# Patient Record
Sex: Female | Born: 1940 | Race: White | Hispanic: No | Marital: Married | Smoking: Former smoker
Health system: Southern US, Community
[De-identification: ages and names within clinical notes are randomized; demographics above are authoritative.]

## PROBLEM LIST (undated history)

## (undated) DIAGNOSIS — R112 Nausea with vomiting, unspecified: Secondary | ICD-10-CM

## (undated) DIAGNOSIS — Z5189 Encounter for other specified aftercare: Secondary | ICD-10-CM

## (undated) DIAGNOSIS — Z9889 Other specified postprocedural states: Secondary | ICD-10-CM

## (undated) DIAGNOSIS — R51 Headache: Secondary | ICD-10-CM

## (undated) DIAGNOSIS — I10 Essential (primary) hypertension: Secondary | ICD-10-CM

## (undated) DIAGNOSIS — D649 Anemia, unspecified: Secondary | ICD-10-CM

## (undated) DIAGNOSIS — M797 Fibromyalgia: Secondary | ICD-10-CM

## (undated) DIAGNOSIS — M1990 Unspecified osteoarthritis, unspecified site: Secondary | ICD-10-CM

## (undated) DIAGNOSIS — F419 Anxiety disorder, unspecified: Secondary | ICD-10-CM

## (undated) DIAGNOSIS — F32A Depression, unspecified: Secondary | ICD-10-CM

## (undated) DIAGNOSIS — IMO0001 Reserved for inherently not codable concepts without codable children: Secondary | ICD-10-CM

## (undated) DIAGNOSIS — F329 Major depressive disorder, single episode, unspecified: Secondary | ICD-10-CM

## (undated) HISTORY — DX: Anemia, unspecified: D64.9

## (undated) HISTORY — PX: INCONTINENCE SURGERY: SHX676

## (undated) HISTORY — DX: Other specified postprocedural states: Z98.890

## (undated) HISTORY — DX: Reserved for inherently not codable concepts without codable children: IMO0001

## (undated) HISTORY — PX: ABDOMINAL HYSTERECTOMY: SHX81

## (undated) HISTORY — DX: Fibromyalgia: M79.7

## (undated) HISTORY — DX: Unspecified osteoarthritis, unspecified site: M19.90

## (undated) HISTORY — DX: Anxiety disorder, unspecified: F41.9

## (undated) HISTORY — DX: Headache: R51

## (undated) HISTORY — PX: TONSILLECTOMY: SUR1361

## (undated) HISTORY — DX: Essential (primary) hypertension: I10

## (undated) HISTORY — DX: Depression, unspecified: F32.A

## (undated) HISTORY — DX: Encounter for other specified aftercare: Z51.89

## (undated) HISTORY — DX: Nausea with vomiting, unspecified: R11.2

## (undated) HISTORY — DX: Major depressive disorder, single episode, unspecified: F32.9

## (undated) HISTORY — PX: RECTOCELE REPAIR: SHX761

## (undated) HISTORY — PX: CYSTOCELE REPAIR: SHX163

---

## 1998-04-28 ENCOUNTER — Other Ambulatory Visit: Admission: RE | Admit: 1998-04-28 | Discharge: 1998-04-28 | Payer: Self-pay | Admitting: Obstetrics & Gynecology

## 1999-04-30 ENCOUNTER — Other Ambulatory Visit: Admission: RE | Admit: 1999-04-30 | Discharge: 1999-04-30 | Payer: Self-pay | Admitting: Obstetrics & Gynecology

## 1999-10-23 ENCOUNTER — Inpatient Hospital Stay (HOSPITAL_COMMUNITY): Admission: RE | Admit: 1999-10-23 | Discharge: 1999-10-26 | Payer: Self-pay | Admitting: Obstetrics & Gynecology

## 1999-10-27 ENCOUNTER — Inpatient Hospital Stay (HOSPITAL_COMMUNITY): Admission: AD | Admit: 1999-10-27 | Discharge: 1999-10-27 | Payer: Self-pay | Admitting: Obstetrics and Gynecology

## 1999-11-17 ENCOUNTER — Encounter: Payer: Self-pay | Admitting: Obstetrics & Gynecology

## 1999-11-17 ENCOUNTER — Encounter: Admission: RE | Admit: 1999-11-17 | Discharge: 1999-11-17 | Payer: Self-pay | Admitting: Obstetrics & Gynecology

## 2000-03-21 ENCOUNTER — Encounter (INDEPENDENT_AMBULATORY_CARE_PROVIDER_SITE_OTHER): Payer: Self-pay

## 2000-03-21 ENCOUNTER — Other Ambulatory Visit: Admission: RE | Admit: 2000-03-21 | Discharge: 2000-03-21 | Payer: Self-pay | Admitting: Gastroenterology

## 2000-04-06 ENCOUNTER — Ambulatory Visit (HOSPITAL_COMMUNITY): Admission: RE | Admit: 2000-04-06 | Discharge: 2000-04-06 | Payer: Self-pay | Admitting: Gastroenterology

## 2000-04-06 ENCOUNTER — Encounter: Payer: Self-pay | Admitting: Gastroenterology

## 2000-06-10 ENCOUNTER — Other Ambulatory Visit: Admission: RE | Admit: 2000-06-10 | Discharge: 2000-06-10 | Payer: Self-pay | Admitting: Gastroenterology

## 2000-11-28 ENCOUNTER — Encounter: Payer: Self-pay | Admitting: Obstetrics & Gynecology

## 2000-11-28 ENCOUNTER — Encounter: Admission: RE | Admit: 2000-11-28 | Discharge: 2000-11-28 | Payer: Self-pay | Admitting: Obstetrics & Gynecology

## 2000-11-30 ENCOUNTER — Encounter: Admission: RE | Admit: 2000-11-30 | Discharge: 2000-11-30 | Payer: Self-pay | Admitting: Obstetrics & Gynecology

## 2000-11-30 ENCOUNTER — Encounter: Payer: Self-pay | Admitting: Obstetrics & Gynecology

## 2001-02-16 ENCOUNTER — Encounter: Admission: RE | Admit: 2001-02-16 | Discharge: 2001-02-16 | Payer: Self-pay | Admitting: Geriatric Medicine

## 2001-02-16 ENCOUNTER — Encounter: Payer: Self-pay | Admitting: Geriatric Medicine

## 2001-06-01 ENCOUNTER — Other Ambulatory Visit: Admission: RE | Admit: 2001-06-01 | Discharge: 2001-06-01 | Payer: Self-pay | Admitting: Obstetrics & Gynecology

## 2001-12-01 ENCOUNTER — Encounter: Admission: RE | Admit: 2001-12-01 | Discharge: 2001-12-01 | Payer: Self-pay | Admitting: Obstetrics & Gynecology

## 2001-12-01 ENCOUNTER — Encounter: Payer: Self-pay | Admitting: Obstetrics & Gynecology

## 2002-12-03 ENCOUNTER — Encounter: Payer: Self-pay | Admitting: Obstetrics and Gynecology

## 2002-12-03 ENCOUNTER — Encounter: Admission: RE | Admit: 2002-12-03 | Discharge: 2002-12-03 | Payer: Self-pay | Admitting: Obstetrics and Gynecology

## 2003-08-20 ENCOUNTER — Other Ambulatory Visit: Admission: RE | Admit: 2003-08-20 | Discharge: 2003-08-20 | Payer: Self-pay | Admitting: Obstetrics and Gynecology

## 2003-12-09 ENCOUNTER — Encounter: Admission: RE | Admit: 2003-12-09 | Discharge: 2003-12-09 | Payer: Self-pay | Admitting: Obstetrics and Gynecology

## 2004-08-24 ENCOUNTER — Other Ambulatory Visit
Admission: RE | Admit: 2004-08-24 | Discharge: 2004-08-24 | Payer: Self-pay | Admitting: Physical Medicine & Rehabilitation

## 2004-12-24 ENCOUNTER — Encounter: Admission: RE | Admit: 2004-12-24 | Discharge: 2004-12-24 | Payer: Self-pay | Admitting: Obstetrics and Gynecology

## 2005-08-24 ENCOUNTER — Other Ambulatory Visit: Admission: RE | Admit: 2005-08-24 | Discharge: 2005-08-24 | Payer: Self-pay | Admitting: Obstetrics and Gynecology

## 2006-01-03 ENCOUNTER — Encounter: Admission: RE | Admit: 2006-01-03 | Discharge: 2006-01-03 | Payer: Self-pay | Admitting: Geriatric Medicine

## 2006-08-31 ENCOUNTER — Other Ambulatory Visit: Admission: RE | Admit: 2006-08-31 | Discharge: 2006-08-31 | Payer: Self-pay | Admitting: Obstetrics and Gynecology

## 2007-01-09 ENCOUNTER — Encounter: Admission: RE | Admit: 2007-01-09 | Discharge: 2007-01-09 | Payer: Self-pay | Admitting: Obstetrics and Gynecology

## 2007-03-16 ENCOUNTER — Ambulatory Visit (HOSPITAL_COMMUNITY): Admission: RE | Admit: 2007-03-16 | Discharge: 2007-03-18 | Payer: Self-pay | Admitting: Urology

## 2007-10-10 ENCOUNTER — Encounter: Admission: RE | Admit: 2007-10-10 | Discharge: 2007-10-10 | Payer: Self-pay | Admitting: Geriatric Medicine

## 2008-01-10 ENCOUNTER — Encounter: Admission: RE | Admit: 2008-01-10 | Discharge: 2008-01-10 | Payer: Self-pay | Admitting: Geriatric Medicine

## 2008-09-04 ENCOUNTER — Other Ambulatory Visit: Admission: RE | Admit: 2008-09-04 | Discharge: 2008-09-04 | Payer: Self-pay | Admitting: Obstetrics and Gynecology

## 2009-03-04 ENCOUNTER — Encounter: Admission: RE | Admit: 2009-03-04 | Discharge: 2009-03-04 | Payer: Self-pay | Admitting: Obstetrics and Gynecology

## 2010-03-09 ENCOUNTER — Encounter: Admission: RE | Admit: 2010-03-09 | Discharge: 2010-03-09 | Payer: Self-pay | Admitting: Geriatric Medicine

## 2010-10-06 NOTE — Op Note (Signed)
NAMEMAHAYLA, Eileen Jennings                  ACCOUNT NO.:  [id number]   MEDICAL RECORD NO.:  [id number]          PATIENT TYPE:  AMB   LOCATION:  DAY                          FACILITY:  [facility]   PHYSICIAN:  Sigmund I. 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   OPERATIONS:  1. Transobturator pubovaginal sling.  2. Anterior repair of cystocele.  3. Pinnacle colpopexy with sacrospinous fixation.  4. Insertion of mesh.   SURGEON:  Sigmund I. Patsi Sears, Eileen.D.   ANESTHESIA:  General LMA.   PREPARATION:  After appropriate preanesthesia, the patient was brought  to the operating room and placed on the operating room table in the  dorsal supine position where general LMA anesthesia was introduced.  She  was then replaced in the dorsal lithotomy position where the pubis was  prepped with Betadine solution and draped in the usual fashion.   REVIEW OF HISTORY:  This 70 year old female is status post anterior and  posterior repair in 2000 by GYN.  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  On physical examination, the  patient has Jennings positive Marshall test and Jennings grade 4 cystourethrocele.  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  The patient is covered with prednisone, and is now for  repair.   PROCEDURE:  With the patient in the dorsal lithotomy position,  examination shows Jennings grade 4 cystourethrocele.  There is scarring of the  rectal fossa, consistent with previous rectocele repair, and there is no  rectocele noted today.  The scarring from the cervix was identified from  previous  hysterectomy.  There was also urethral scar from previous  urethrocele repair.  The vaginal tissue is very thinned.  The cystocele  was injected with 0.25 Marcaine with epinephrine, 1:200,000, 20 mL.  The  midline incision was made.  Subcutaneous dissection was accomplished  with sharp and blunt dissection, with care taken to avoid any bladder  injury.  Bleeding was electrocoagulated.  The dissection was carried to  the ischial spines bilaterally.  Care was taken to avoid any incision in  the mid distal urethra.  Following dissection, anterior vaginal vault  repair was accomplished using Kelly plication with horizontal mattress  running 3-0 Vicryl suture.  Following this, colpopexy with sacrospinous  fixation was accomplished using the [employer] device.  This was accomplished by identifying the ischial spines and then placing  the Capio device through the sacrospinous ligament on the left and the  arcus tendineus on left; and then the sacrospinous ligament on the right  and the arcus tendineus on the right.  The fixation was accomplished  using permanent mesh material.  This was then snugged into place, and  the excess was removed.  The mesh was then tacked to the  anterior  vaginal vault.  Excellent reduction of the cystocele was noted.  No  bleeding was noted.  Cystoscopy was accomplished after indigo carmine  was given, and jets of blue urine were seen from both ureteral orifices.   The Foley was replaced, and the edge of the vaginal tissue was excised  and the anterior vaginal wall closed with running 3-0 Vicryl suture.   Attention was then directed to the midurethra, where Jennings of 1.5-cm  incision was made and the subcutaneous tissue dissected with sharp and  blunt dissection.  0.5 cm lateral to the clitoris was marked with Jennings blue  pen, and incision was made with the tip of Jennings knife blade.  Using the  [facility] transobturator sling, Jennings pubovaginal sling was  placed.  Cystoscopy was again accomplished, and blue dye was again seen coming  from the ureteral orifices.  No mesh was identified within the bladder.  The bladder was again drained with the Foley catheter, and using Jennings right-  angle for tensioning behind the sling, the plastic sleeves were cut and  the sleeves removed and amputated at the level of the skin.  The skin  wounds were closed with Dermabond.  The urethral incision was closed  with 3-0 Vicryl suture.  Packing was applied with Estrace cream, and the  Foley catheter was left in place.   The patient was awakened and taken to the recovery room in good  condition.  She had Jennings B&O suppository placed preoperatively.      Sigmund I. Patsi Sears, Eileen.D.  Electronically Signed     SIT/MEDQ  D:  03/16/2007  T:  03/17/2007  Job:  [id number]   cc:   Hal T. Stoneking, Eileen.D.  Fax: [phone]

## 2010-10-06 NOTE — H&P (Signed)
[name], [name]                  ACCOUNT NO.:  [id number]   MEDICAL RECORD NO.:  [id number]          PATIENT TYPE:  OIB   LOCATION:  1437                         FACILITY:  [facility]   PHYSICIAN:  Sigmund I. Patsi Sears, M.D.DATE OF BIRTH:  03-10-1941   DATE OF ADMISSION:  03/16/2007  DATE OF DISCHARGE:                              HISTORY & PHYSICAL   HISTORY:  [name] is a 70 year old married female from [place],  [place], seen February 14, 2007, with recurrent large grade 4  cystocele status post anterior and posterior repair per gynecology in  2000.  She also had stress urinary incontinence.  The patient has a past  medical history significant for:  1. Ankylosing spondylitis.  2. Positive rheumatoid factor.  3. 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  This has been treated with      prednisone 3 mg a day.   Urodynamics were accomplished and shows that the patient has a small  capacity bladder with early sensory urgency and very large pelvic  prolapse.  She is admitted to hospital for pelvic floor repair.  She has  been using Oxytrol patches the past.  Additional medications include:  1. Macrobid one p.o. per day.  2. Oxytrol.  3. Prednisone as noted above.  4. Premarin.  5. Toprol-XL.  6. P.r.n. Tylenol.   PAST MEDICAL HISTORY:  Significant for:  1. Ankylosing Spondylitis  2. Arthritis (see above).  3. Glaucoma (borderline).  4. Depression.  5. Anxiety.   FAMILY HISTORY:  1. Depression.  2. Arthritis.  3. Stroke (father died secondary to self-inflicted wound).   SOCIAL:  The patient is a retired Interior and spatial designer.  She is married with two  daughters.  Alcohol:  None.  Tobacco:  A 34 pack-year history but none  currently.  Caffeinated drinks:  One per day.   CONSTITUTIONAL REVIEW OF SYSTEMS:  1. Easy bruisability.  2. Chronic cough.  3. Joint discomfort.  4. Headaches.   ADMISSION PHYSICAL EXAMINATION:  Shows a well-developed, thin female in  no acute stress.   Blood pressure is 147/81, pulse 84, temperature 98.8.  NECK:  Supple, nontender, no nodes.  CHEST:  Clear to P&A.  ABDOMEN:  Soft, positive bowel sounds without organomegaly, without  masses.  GENITOURINARY:  Shows normal female external genitalia.  She has  atrophic vaginitis.  There is a grade 4 pelvic mass, thought to be  cystocele.  I do not see rectocele.  It is unknown if there is  enterocele.  The patient has a positive Marshall test and a positive Q-  tip test.  RECTAL EXAMINATION:  Shows normal sphincter tone.  EXTREMITIES:  No cyanosis, no edema.  PSYCHOLOGIC:  Normal orientation to time, person, and place.   ADMITTING IMPRESSION:  Pelvic prolapse with recurrent grade 4 cystocele,  connective tissue disease on prednisone therapy.   PLAN:  Admit via the operating room for repair.      Sigmund I. Patsi Sears, M.D.  Electronically Signed     SIT/MEDQ  D:  03/17/2007  T:  03/18/2007  Job:  [id number]   cc:   Excell Seltzer.  Zieminski, M.D.  Fax: [phone]

## 2010-10-09 NOTE — Discharge Summary (Signed)
[facility]  Patient:    Eileen Jennings, Eileen Jennings                         MRN: [id number] Adm. Date:  [id number] Disc. Date: 10/26/99 Attending:  Minette Headland                           Discharge Summary  DISCHARGE DIAGNOSES:          1. Third degree cystocele with prolapse.                               2. Second degree rectocele with prolapse                                  to the vaginal introitus.  OPERATION:                    1. Anterior and posterior colporrhaphies.                               2. Sacrospinal ligament suspension of the                                  vagina.  POSTOPERATIVE COMPLICATIONS:  None.  DISPOSITION:                  The patient is in satisfactory good condition at the time of her discharge. She is still not voiding and has a suprapubic catheter in place.  She is given detailed instructions of monitoring her voiding trials and just to call the office when her residuals are 50 to 75 cc and voided volumes 200 to 300 cc.  She is to call for fever, severe pain or heavy bleeding. She is to take a vitamin with iron.  She is given Percocet to be taken as needed for postoperative pain.  She is to return to the office with adequate voiding or in approximately three weeks.  She is to avoid vaginal entry or heavy lifting.  She is to take a regular diet.  HISTORY OF PRESENT ILLNESS:   Details of the present illness recorded in the admission note as is the physical examination.  PHYSICAL EXAMINATION:         On admission was primarily remarkable for the pelvic findings with cystocele with prolapse through the introitus and rectocele with prolapse to the introitus. There was marked of the vaginal vault.  LABORATORY DATA:              During this admission includes admission hemoglobin of 12.3. Normal EKG on admission.  Prothrombin time and PTT were normal. Postoperative hemoglobin was 8.8 on the first postoperative and 8.8  on the second postoperative day.  HOSPITAL COURSE:              The patient was admitted on the morning of surgery.  She was treated perioperatively with intravenous antibiotics and with PAS hose. The above described surgical procedure was accomplished without significant difficulty.  There was a rather marked amount of blood loss which was not unusual for this type of procedure.  The patients postoperative course was uncomplicated.  She was having slow return of bladder function requiring a more prolonged hospital stay.  She also had rather slow return of bowel function. This was eventually rectified and by the morning of the third postoperative day, she was in good condition and was discharged with Disposition as noted above. DD:  10/26/99 TD:  10/26/99 Job: [id number]

## 2010-10-09 NOTE — H&P (Signed)
[facility]  Patient:    Eileen Jennings, Eileen Jennings                           MRN: [id number] Adm. Date:  10/23/99 Attending:  Freddy Finner, M.D.                         History and Physical  ADMITTING DIAGNOSES:          Third degree cystocele with prolapse, second degree rectocele.                                Patient is a 70 year old white married female gravida 4, para 2 who had total abdominal hysterectomy, bilateral salpingo-oophorectomy for fibroids in 1990.  In the fairly recent past she presented complaining that her bladder had dropped.  She had been known to have a small cystocele in the past but on recent examination in April of this year she was found to have a large cystocele which prolapsed through the vaginal introitus.  There was also a moderate rectocele which prolapsed to the vaginal introitus.  She has requested definitive surgical intervention.  Is admitted at this time for anterior and posterior colporrhaphy and sacrospinous ligament suspension of the vagina.  PAST MEDICAL HISTORY:         Remarkable for polymyalgia.  At times in the past she has taken prednisone.  She is also known to have osteopenia for which she takes Fosamax 70 mg a week and calcium 1200 mg a day.  She is currently on hormone replacement therapy, specifically Premarin 0.9 mg per day.  She has no other known significant medical illnesses.  She has never had a blood transfusion.  ALLERGIES:                    No known drug allergies.  SOCIAL HISTORY:               She does not use cigarettes now for approximately 27 years.  She uses alcohol only occasionally.  REVIEW OF SYSTEMS:            Negative.  She has no cardiopulmonary, GI, or GU complaints except as noted in the present illness with symptomatic cystocele and rectocele.  PHYSICAL EXAMINATION:  GENERAL:                      Patient is a well-nourished, well-developed white female in no acute  distress.  NECK:                         Thyroid gland is not palpably enlarged to my examination.  VITAL SIGNS:                  Blood pressure 138/84.  BREASTS:                      Normal.  No masses, discharge.  No skin change.  HEART:                        Normal sinus rhythm without murmurs, rubs, or gallops.  LUNGS:                        Clear to auscultation.  ABDOMEN:                      Soft and nontender without appreciable organomegaly or palpable masses.  PELVIC:                       Findings as noted above.  EXTREMITIES:                  Without cyanosis, clubbing, edema.  ASSESSMENT:                   Symptomatic pelvic relaxation with cystocele with prolapse and rectocele with prolapse to the introitus.  PLAN:                         Anterior and posterior vaginal colporrhaphy and sacrospinous ligament suspension. DD:  10/22/99 TD:  10/22/99 Job: [id number]

## 2010-10-09 NOTE — Op Note (Signed)
[facility]  Patient:    Eileen Jennings, Eileen Jennings                           MRN: [id number] Proc. Date: 10/23/99 Attending:  Freddy Finner, M.D.                           Operative Report  PREOPERATIVE DIAGNOSIS: 1. Third degree cystourethrocele. 2. Third degree rectocele. 3. Vaginal vault ______ .  POSTOPERATIVE DIAGNOSIS:  OPERATION:                        Anterior and posterior colporrhaphy, sacrospinal ligament suspension of the vagina.  SURGEON:                          Freddy Finner, M.D.  ASSISTANT:                        Juluis Mire, M.D.  ANESTHESIA:                       General anesthesia.  INTRAOPERATIVE COMPLICATIONS:      None.  ESTIMATED INTRAOPERATIVE BLOOD LOSS:  600 cc.  HISTORY:                          Details of the present illness are recorded in the admission note.  DESCRIPTION OF PROCEDURE:         The patient was admitted on the morning of surgery.  She was given 1 g Cefotan IV.  She was placed in PAS hose. She was brought to the operating room, placed under adequate general endotracheal anesthesia, placed in the dorsolithotomy position using Allen stirrups system. Betadine prep of the labium, perineum, and vagina was carried out i Sterile n the usual fashion using Betadine scrub followed by Betadine solution.  Sterile drapes were applied.  Posterior weighted vaginal retractor was placed.  Apex of the vagina was grasped with Allis.  A linear incision was made in the midline with a scalpel through the mucosa overlying the bladder.  Edges of the mucosa were grasped with Allis.  In progressive sharp and blunt dissection, the mucosa was freed from the underlying bladder.  This was continued to a distance of about 1.5 cm from the urethral meatus.  Plication sutures of 0 Monocryl were used to plicate the vesicovaginal fascia and support and reduce the cystocele.  Segments of mucosa were tossed bilaterally.  The incision  was then closed with a running 2-0 Monocryl suture.  Allis were attached to the fourchette posteriorly, and a pyramidal shaped segment of skin was excised from the perineum with the base superior and apex inferior.  The mucosa overlying the rectum was also then carefully sharply and blunted dissected free of the overlying mucosa.  This dissection was carried essentially to the apex of the vagina.  The sacrospinous ligament was then identified on the right side.  Using a chute clamp, a #1 PDS suture was placed through the ligament. This actually broke during tying of the suture, and a second attempt was done with a 0 PDS because the chute clamp would not accept the #1 suture and pass it adequately on the second attempt.  This was attached to the mid portion of the sacrospinous  ligament approximately 1.5 cm from the sacrum and from the ischial tuberosity.  The suture was anchored to the apex of the vagina with needle in a figure-of-eight pattern.  Posterior plication sutures were used to approximate the perirectal tissues and recreate a rectovaginal septum.  The suture in the sacrospinous ligament was then tied, and this elevated the vault adequately.  Mucosa was removed posteriorly.  The mucosa was closed with a running 2-0 Monocryl suture in a fashion similar to episiotomy repair running down the vaginal mucosa and then running subcuticularly to the inferior margin of the incision and then subcuticularly to close the perioneal skin.  The bladder would not accept 300 cc of irrigating solution, and approximately 150 was placed into the bladder. Suprapubic banana catheter was then placed without difficulty and anchored to the skin with 3-0 nylon sutures.  The bladder was evacuated, and there was approximately 200 to 300 cc of urine. A 2 inch iodoform gauze pack was placed into the vaginal canal.  The procedure was terminated.  The patient was awakened and taken to the recovery room in good  condition. DD:  10/23/99 TD:  10/23/99 Job: [id number]

## 2011-01-27 ENCOUNTER — Other Ambulatory Visit: Payer: Self-pay | Admitting: Geriatric Medicine

## 2011-01-27 DIAGNOSIS — Z1231 Encounter for screening mammogram for malignant neoplasm of breast: Secondary | ICD-10-CM

## 2011-03-03 LAB — BASIC METABOLIC PANEL
BUN: 9
CO2: 27
Creatinine, Ser: 0.67
GFR calc Af Amer: >60
GFR calc non Af Amer: >60
Glucose, Bld: 98
Sodium: 143

## 2011-03-11 ENCOUNTER — Ambulatory Visit
Admission: RE | Admit: 2011-03-11 | Discharge: 2011-03-11 | Disposition: A | Discharge status: Home / Self Care | Payer: Medicare Other | Source: Ambulatory Visit | Attending: Geriatric Medicine | Admitting: Geriatric Medicine

## 2011-03-11 DIAGNOSIS — Z1231 Encounter for screening mammogram for malignant neoplasm of breast: Secondary | ICD-10-CM

## 2011-03-26 ENCOUNTER — Encounter (HOSPITAL_COMMUNITY): Payer: Self-pay | Admitting: Pharmacy Technician

## 2011-03-29 ENCOUNTER — Encounter (HOSPITAL_COMMUNITY): Payer: Self-pay

## 2011-03-29 ENCOUNTER — Encounter (HOSPITAL_COMMUNITY): Payer: Medicare Other

## 2011-03-29 ENCOUNTER — Ambulatory Visit (HOSPITAL_COMMUNITY)
Admission: RE | Admit: 2011-03-29 | Discharge: 2011-03-29 | Disposition: A | Discharge status: Home / Self Care | Payer: Medicare Other | Source: Ambulatory Visit | Attending: Orthopedic Surgery | Admitting: Orthopedic Surgery

## 2011-03-29 ENCOUNTER — Other Ambulatory Visit: Payer: Self-pay

## 2011-03-29 DIAGNOSIS — Z01812 Encounter for preprocedural laboratory examination: Secondary | ICD-10-CM | POA: Insufficient documentation

## 2011-03-29 DIAGNOSIS — Z01810 Encounter for preprocedural cardiovascular examination: Secondary | ICD-10-CM | POA: Insufficient documentation

## 2011-03-29 DIAGNOSIS — J8410 Pulmonary fibrosis, unspecified: Secondary | ICD-10-CM | POA: Insufficient documentation

## 2011-03-29 DIAGNOSIS — M1710 Unilateral primary osteoarthritis, unspecified knee: Secondary | ICD-10-CM | POA: Insufficient documentation

## 2011-03-29 LAB — COMPREHENSIVE METABOLIC PANEL
CO2: 25 mEq/L (ref 19–32)
Calcium: 9.7 mg/dL (ref 8.4–10.5)
Creatinine, Ser: 0.64 mg/dL (ref 0.50–1.10)
GFR calc Af Amer: >90 mL/min (ref >90)
GFR calc non Af Amer: 88 mL/min — ABNORMAL LOW (ref >90)
Glucose, Bld: 91 mg/dL (ref 70–99)

## 2011-03-29 LAB — CBC
HCT: 38.2 % (ref 36.0–46.0)
Hemoglobin: 12.2 g/dL (ref 12.0–15.0)
MCH: 32.4 pg (ref 26.0–34.0)
MCV: 101.6 fL — ABNORMAL HIGH (ref 78.0–100.0)
RBC: 3.76 MIL/uL — ABNORMAL LOW (ref 3.87–5.11)

## 2011-03-29 LAB — TYPE AND SCREEN: ABO/RH(D): A POS

## 2011-03-29 LAB — URINALYSIS, ROUTINE W REFLEX MICROSCOPIC
Protein, ur: NEGATIVE mg/dL
Urobilinogen, UA: 0.2 mg/dL (ref 0.0–1.0)

## 2011-03-29 LAB — ABO/RH: ABO/RH(D): A POS

## 2011-03-29 LAB — PROTIME-INR: INR: 0.98 (ref 0.00–1.49)

## 2011-03-29 LAB — URINE MICROSCOPIC-ADD ON

## 2011-03-29 LAB — SURGICAL PCR SCREEN
MRSA, PCR: NEGATIVE
Staphylococcus aureus: NEGATIVE

## 2011-03-29 MED ORDER — CHLORHEXIDINE GLUCONATE 4 % EX LIQD: 60.0000 mL | Freq: Once | CUTANEOUS | Status: DC

## 2011-03-29 NOTE — Patient Instructions (Signed)
20 ZERA MARKWARDT  03/29/2011   Your procedure is scheduled on:  03/31/2011  Report to [facility] at 11 am  Call this number if you have problems the morning of surgery: [phone]   Remember:   Do not eat food:after midnight  Do not drink clear liquids: 6 Hours before arrival. Midnight until 0700 am  Take these medicines the morning of surgery with A SIP OF WATER: Metoprolol, Prednisone, Ativan   Do not wear jewelry, make-up or nail polish.  Do not wear lotions, powders, or perfumes.   Do not shave 48 hours prior to surgery.  Do not bring valuables to the hospital.  Contacts, dentures or bridgework may not be worn into surgery.  Leave suitcase in the car. After surgery it may be brought to your room.  For patients admitted to the hospital, checkout time is 11:00 AM the day of discharge.   Patients discharged the day of surgery will not be allowed to drive home.  Name and phone number of your driver: Bethann Berkshire [phone] Special Instructions: CHG Shower Use Special Wash: 1/2 bottle night before surgery and 1/2 bottle morning of surgery.   Please read over the following fact sheets that you were given: MRSA Information

## 2011-03-30 ENCOUNTER — Other Ambulatory Visit: Payer: Self-pay | Admitting: Orthopedic Surgery

## 2011-03-30 MED ORDER — BUPIVACAINE 0.25 % ON-Q PUMP SINGLE CATH 300ML
300.0000 mL | INJECTION | Status: DC
  Administered 2011-03-31: 300 mL
  Filled 2011-03-30: qty 300

## 2011-03-30 MED ORDER — BUPIVACAINE 0.25 % ON-Q PUMP SINGLE CATH 300ML
300.0000 mL | INJECTION | Status: DC
  Filled 2011-03-30: qty 300

## 2011-03-30 NOTE — H&P (Signed)
[place] R. [place]uck 03/30/2011 3:14 PM  DOB: 11/14/1940  History of Present Illness: The patient is a 70 year old female who comes in today for a preoperative History and Physical. The patient is scheduled for a left total knee arthroplasty to be performed by Dr. Frank V. Aluisio, MD at [facility] on March 31, 2011 .  Allergies: No Known Drug Allergies.   Family History: Father. Deceased, Heart disease. suicide Mother. Deceased. Pneumonia, Age 84   Social History: Tobacco use. Former smoker. No alcohol use Marital status. Married. Living situation. Lives with spouse. Children. 2 Post-Surgical Plans. Plan for Home   Medication History: Premarin (0.3MG Tablet, Oral) Active. PredniSONE (Pak) ( Oral) Specific dose unknown - Active. Metoprolol-Hydrochlorothiazide ( Oral) Specific dose unknown - Active. Ativan ( Oral) Specific dose unknown - Active. Cymbalta ( Oral) Specific dose unknown - Active. Ambien (10MG Tablet, Oral) Active.   Past Surgical History: Hysterectomy (not due to cancer) - Complete. Date: 1991. Rectocele/Cystocele Repair Bladder Sling. Date: 2008.   Medical History: Anxiety Disorder Hypertension Urinary Incontinence Urinary Tract Infection. History Polymyalgia Rheumatica Osteoporosis Depression   Review of Systems: General:Present- Fatigue. Not Present- Chills, Fever, Night Sweats, Weight Gain, Weight Loss and Memory Loss. Skin:Not Present- Hives, Itching, Rash, Eczema and Lesions. HEENT:Not Present- Tinnitus, Headache, Double Vision, Visual Loss, Hearing Loss and Dentures. Respiratory:Present- Chronic Cough (Sinus issues which is resolved.). Not Present- Shortness of breath with exertion, Shortness of breath at rest, Allergies and Coughing up blood. Cardiovascular:Not Present- Chest Pain, Racing/skipping heartbeats, Difficulty Breathing Lying Down, Murmur, Swelling and Palpitations. Gastrointestinal:Not  Present- Bloody Stool, Heartburn, Abdominal Pain, Vomiting, Nausea, Constipation, Diarrhea, Difficulty Swallowing, Jaundice and Loss of appetitie. Female Genitourinary:Present- Urinary frequency, Frequency and Urinating at Night. Not Present- Blood in Urine, Weak urinary stream, Discharge, Incontinence, Painful Urination, Urgency and Urinary Retention. Musculoskeletal:Present- Morning Stiffness. Not Present- Muscle Weakness, Muscle Pain, Joint Swelling, Joint Pain, Back Pain and Spasms. Neurological:Not Present- Tremor, Dizziness, Blackout spells, Paralysis, Difficulty with balance and Weakness. Psychiatric:Present- Insomnia.   Vitals: 03/30/2011 4:22 PM Weight: 158 lb Height: 63 in Weight was reported by patient. Height was reported by patient. Body Surface Area: 1.78 m Body Mass Index: 27.99 kg/m Pulse: 84 (Regular) Resp.: 16 (Unlabored) BP: 124/82 (Sitting, Right Arm, Standard)    Physical Exam: The physical exam findings are as follows:   General Mental Status - Alert, cooperative and good historian. General Appearance- pleasant. Not in acute distress. Orientation- Oriented X3. Build & Nutrition- Well nourished and Well developed.   Head and Neck Head- normocephalic, atraumatic . Neck Global Assessment- supple. no bruit auscultated on the right and no bruit auscultated on the left.   Eye Pupil- Bilateral- PERRLA. Motion- Bilateral- EOMI.   Chest and Lung Exam Auscultation: Breath sounds:- clear at anterior chest wall and - clear at posterior chest wall. Adventitious sounds:- No Adventitious sounds.   Cardiovascular Auscultation:Rhythm- Regular rate and rhythm. Heart Sounds- S1 WNL and S2 WNL. Murmurs & Other Heart Sounds:Auscultation of the heart reveals - No Murmurs.   Abdomen Palpation/Percussion:Tenderness- Abdomen is non-tender to palpation. Rigidity (guarding)- Abdomen is soft. Auscultation:Auscultation of the  abdomen reveals - Bowel sounds normal.   Female Genitourinary   Note: Not done, not pertinent to present illness  Musculoskeletal   Note: Evaluation of her left knee shows no effusion. Her hip has normal range of motion, no discomfort. Her range of motion on the left is 5 to 120. She has a varus deformity. There is marked crepitus on range of   motion. She is tender medial greater than lateral with no instability noted. Right knee - no effusion, no deformity. Range is 0 to 125. Slight tenderness, medial greater than lateral with no instability.  Assessment & Plan(DREW L Natoya Viscomi, PA-C; 03/30/2011 7:24 PM) Osteoarthritis, knee (715.96) Impression: Left Knee  Note: Patient to be admitted for left total knee replacemant by Dr. Aluisio.   

## 2011-03-31 ENCOUNTER — Encounter (HOSPITAL_COMMUNITY): Payer: Self-pay | Admitting: Anesthesiology

## 2011-03-31 ENCOUNTER — Encounter (HOSPITAL_COMMUNITY)
Admission: RE | Disposition: A | Discharge status: Home Health | Payer: Self-pay | Source: Ambulatory Visit | Attending: Orthopedic Surgery

## 2011-03-31 ENCOUNTER — Inpatient Hospital Stay (HOSPITAL_COMMUNITY)
Admission: RE | Admit: 2011-03-31 | Discharge: 2011-04-03 | DRG: 470 | Disposition: A | Discharge status: Home Health | Payer: Medicare Other | Source: Ambulatory Visit | Attending: Orthopedic Surgery | Admitting: Orthopedic Surgery

## 2011-03-31 ENCOUNTER — Inpatient Hospital Stay (HOSPITAL_COMMUNITY): Payer: Medicare Other | Admitting: Anesthesiology

## 2011-03-31 ENCOUNTER — Encounter (HOSPITAL_COMMUNITY): Payer: Self-pay | Admitting: *Deleted

## 2011-03-31 ENCOUNTER — Encounter (HOSPITAL_COMMUNITY): Payer: Self-pay | Admitting: Orthopedic Surgery

## 2011-03-31 DIAGNOSIS — F411 Generalized anxiety disorder: Secondary | ICD-10-CM | POA: Diagnosis present

## 2011-03-31 DIAGNOSIS — M810 Age-related osteoporosis without current pathological fracture: Secondary | ICD-10-CM | POA: Diagnosis present

## 2011-03-31 DIAGNOSIS — Z79899 Other long term (current) drug therapy: Secondary | ICD-10-CM

## 2011-03-31 DIAGNOSIS — D62 Acute posthemorrhagic anemia: Secondary | ICD-10-CM | POA: Diagnosis not present

## 2011-03-31 DIAGNOSIS — IMO0002 Reserved for concepts with insufficient information to code with codable children: Secondary | ICD-10-CM

## 2011-03-31 DIAGNOSIS — F329 Major depressive disorder, single episode, unspecified: Secondary | ICD-10-CM | POA: Diagnosis present

## 2011-03-31 DIAGNOSIS — E871 Hypo-osmolality and hyponatremia: Secondary | ICD-10-CM | POA: Diagnosis not present

## 2011-03-31 DIAGNOSIS — M353 Polymyalgia rheumatica: Secondary | ICD-10-CM | POA: Diagnosis present

## 2011-03-31 DIAGNOSIS — I10 Essential (primary) hypertension: Secondary | ICD-10-CM | POA: Diagnosis present

## 2011-03-31 DIAGNOSIS — Z79890 Hormone replacement therapy (postmenopausal): Secondary | ICD-10-CM

## 2011-03-31 DIAGNOSIS — Z87440 Personal history of urinary (tract) infections: Secondary | ICD-10-CM

## 2011-03-31 DIAGNOSIS — F3289 Other specified depressive episodes: Secondary | ICD-10-CM | POA: Diagnosis present

## 2011-03-31 DIAGNOSIS — M1710 Unilateral primary osteoarthritis, unspecified knee: Principal | ICD-10-CM | POA: Diagnosis present

## 2011-03-31 DIAGNOSIS — R32 Unspecified urinary incontinence: Secondary | ICD-10-CM | POA: Diagnosis present

## 2011-03-31 DIAGNOSIS — Z01812 Encounter for preprocedural laboratory examination: Secondary | ICD-10-CM

## 2011-03-31 DIAGNOSIS — Z87891 Personal history of nicotine dependence: Secondary | ICD-10-CM

## 2011-03-31 HISTORY — PX: TOTAL KNEE ARTHROPLASTY: SHX125

## 2011-03-31 SURGERY — ARTHROPLASTY, KNEE, TOTAL
Anesthesia: Spinal | Site: Knee | Laterality: Left | Wound class: Clean

## 2011-03-31 MED ORDER — PREDNISOLONE 5 MG PO TABS: 10.0000 mg | ORAL_TABLET | Freq: Two times a day (BID) | ORAL | Status: DC

## 2011-03-31 MED ORDER — MORPHINE SULFATE (PF) 1 MG/ML IV SOLN
INTRAVENOUS | Status: DC
  Administered 2011-03-31: 0.8 mg via INTRAVENOUS
  Administered 2011-03-31: 17:00:00 via INTRAVENOUS
  Administered 2011-04-01: 8.6 mg via INTRAVENOUS
  Administered 2011-04-01: 5 mg via INTRAVENOUS
  Administered 2011-04-01: 10 mg via INTRAVENOUS
  Filled 2011-03-31 (×2): qty 30

## 2011-03-31 MED ORDER — CEFAZOLIN SODIUM 1-5 GM-% IV SOLN
INTRAVENOUS | Status: AC
  Filled 2011-03-31: qty 50

## 2011-03-31 MED ORDER — METHOCARBAMOL 500 MG PO TABS
500.0000 mg | ORAL_TABLET | Freq: Four times a day (QID) | ORAL | Status: DC | PRN
  Administered 2011-03-31 – 2011-04-02 (×4): 500 mg via ORAL
  Filled 2011-03-31 (×4): qty 1

## 2011-03-31 MED ORDER — FLEET ENEMA 7-19 GM/118ML RE ENEM: 1.0000 | ENEMA | Freq: Every day | RECTAL | Status: DC | PRN

## 2011-03-31 MED ORDER — SODIUM CHLORIDE 0.9 % IV BOLUS (SEPSIS)
500.0000 mL | Freq: Once | INTRAVENOUS | Status: AC
  Administered 2011-03-31: 500 mL via INTRAVENOUS

## 2011-03-31 MED ORDER — METOCLOPRAMIDE HCL 5 MG/ML IJ SOLN: 5.0000 mg | Freq: Three times a day (TID) | INTRAMUSCULAR | Status: DC | PRN

## 2011-03-31 MED ORDER — DIPHENHYDRAMINE HCL 50 MG/ML IJ SOLN
12.5000 mg | Freq: Four times a day (QID) | INTRAMUSCULAR | Status: DC | PRN
  Filled 2011-03-31: qty 0.25

## 2011-03-31 MED ORDER — PROPOFOL 10 MG/ML IV EMUL
INTRAVENOUS | Status: DC | PRN
  Administered 2011-03-31: 100 ug/kg/min via INTRAVENOUS

## 2011-03-31 MED ORDER — METOCLOPRAMIDE HCL 10 MG PO TABS: 5.0000 mg | ORAL_TABLET | Freq: Three times a day (TID) | ORAL | Status: DC | PRN

## 2011-03-31 MED ORDER — ACETAMINOPHEN 325 MG PO TABS: 650.0000 mg | ORAL_TABLET | Freq: Four times a day (QID) | ORAL | Status: DC | PRN

## 2011-03-31 MED ORDER — MENTHOL 3 MG MT LOZG: 1.0000 | LOZENGE | OROMUCOSAL | Status: DC | PRN

## 2011-03-31 MED ORDER — BUPIVACAINE HCL 0.75 % IJ SOLN
INTRAMUSCULAR | Status: DC | PRN
  Administered 2011-03-31: 1.7 mL

## 2011-03-31 MED ORDER — EPHEDRINE SULFATE 50 MG/ML IJ SOLN
INTRAMUSCULAR | Status: DC | PRN
  Administered 2011-03-31: 10 mg via INTRAVENOUS

## 2011-03-31 MED ORDER — POLYETHYLENE GLYCOL 3350 17 G PO PACK
17.0000 g | PACK | Freq: Every day | ORAL | Status: DC | PRN
  Filled 2011-03-31: qty 1

## 2011-03-31 MED ORDER — ZOLPIDEM TARTRATE 10 MG PO TABS
10.0000 mg | ORAL_TABLET | Freq: Every day | ORAL | Status: DC
  Administered 2011-03-31 – 2011-04-01 (×2): 10 mg via ORAL
  Filled 2011-03-31 (×3): qty 1

## 2011-03-31 MED ORDER — ONDANSETRON HCL 4 MG/2ML IJ SOLN
4.0000 mg | Freq: Four times a day (QID) | INTRAMUSCULAR | Status: DC | PRN
  Administered 2011-03-31 – 2011-04-01 (×3): 4 mg via INTRAVENOUS
  Filled 2011-03-31 (×2): qty 2

## 2011-03-31 MED ORDER — ONDANSETRON HCL 4 MG/2ML IJ SOLN
INTRAMUSCULAR | Status: DC | PRN
  Administered 2011-03-31: 4 mg via INTRAVENOUS

## 2011-03-31 MED ORDER — DULOXETINE HCL 30 MG PO CPEP
30.0000 mg | ORAL_CAPSULE | ORAL | Status: DC
  Administered 2011-03-31 – 2011-04-02 (×3): 30 mg via ORAL
  Filled 2011-03-31 (×4): qty 1

## 2011-03-31 MED ORDER — METHOCARBAMOL 100 MG/ML IJ SOLN
500.0000 mg | Freq: Four times a day (QID) | INTRAVENOUS | Status: DC | PRN
  Filled 2011-03-31: qty 5

## 2011-03-31 MED ORDER — ONDANSETRON HCL 4 MG/2ML IJ SOLN
4.0000 mg | Freq: Four times a day (QID) | INTRAMUSCULAR | Status: DC | PRN
  Filled 2011-03-31 (×2): qty 2

## 2011-03-31 MED ORDER — SODIUM CHLORIDE 0.9 % IJ SOLN: 9.0000 mL | INTRAMUSCULAR | Status: DC | PRN

## 2011-03-31 MED ORDER — CEFAZOLIN SODIUM 1-5 GM-% IV SOLN
1.0000 g | Freq: Once | INTRAVENOUS | Status: AC
  Administered 2011-03-31: 1 g via INTRAVENOUS

## 2011-03-31 MED ORDER — SODIUM CHLORIDE 0.9 % IR SOLN
Status: DC | PRN
  Administered 2011-03-31 (×2): 1000 mL

## 2011-03-31 MED ORDER — ACETAMINOPHEN 650 MG RE SUPP: 650.0000 mg | Freq: Four times a day (QID) | RECTAL | Status: DC | PRN

## 2011-03-31 MED ORDER — METOPROLOL SUCCINATE ER 50 MG PO TB24
50.0000 mg | ORAL_TABLET | Freq: Every day | ORAL | Status: DC
  Administered 2011-04-01 – 2011-04-03 (×3): 50 mg via ORAL
  Filled 2011-03-31 (×3): qty 1

## 2011-03-31 MED ORDER — PHENOL 1.4 % MT LIQD: 1.0000 | OROMUCOSAL | Status: DC | PRN

## 2011-03-31 MED ORDER — DOCUSATE SODIUM 100 MG PO CAPS
100.0000 mg | ORAL_CAPSULE | Freq: Two times a day (BID) | ORAL | Status: DC
  Administered 2011-03-31 – 2011-04-03 (×6): 100 mg via ORAL
  Filled 2011-03-31 (×7): qty 1

## 2011-03-31 MED ORDER — CEFAZOLIN SODIUM 1-5 GM-% IV SOLN
1.0000 g | Freq: Four times a day (QID) | INTRAVENOUS | Status: AC
  Administered 2011-03-31 – 2011-04-01 (×3): 1 g via INTRAVENOUS
  Filled 2011-03-31 (×3): qty 50

## 2011-03-31 MED ORDER — PREDNISONE 10 MG PO TABS
10.0000 mg | ORAL_TABLET | Freq: Two times a day (BID) | ORAL | Status: AC
  Administered 2011-04-02 (×2): 10 mg via ORAL
  Filled 2011-03-31 (×2): qty 1

## 2011-03-31 MED ORDER — PREDNISONE 5 MG PO TABS: 5.0000 mg | ORAL_TABLET | ORAL | Status: DC

## 2011-03-31 MED ORDER — ONDANSETRON HCL 4 MG PO TABS
4.0000 mg | ORAL_TABLET | Freq: Four times a day (QID) | ORAL | Status: DC | PRN
  Administered 2011-04-02: 4 mg via ORAL
  Filled 2011-03-31 (×2): qty 1

## 2011-03-31 MED ORDER — MAGNESIUM HYDROXIDE 400 MG/5ML PO SUSP: 30.0000 mL | Freq: Two times a day (BID) | ORAL | Status: DC | PRN

## 2011-03-31 MED ORDER — PREDNISONE 5 MG PO TABS
5.0000 mg | ORAL_TABLET | Freq: Two times a day (BID) | ORAL | Status: DC
  Administered 2011-04-03: 5 mg via ORAL
  Filled 2011-03-31 (×2): qty 1

## 2011-03-31 MED ORDER — LACTATED RINGERS IV SOLN
INTRAVENOUS | Status: DC
  Administered 2011-03-31 (×2): via INTRAVENOUS
  Administered 2011-03-31: 1000 mL via INTRAVENOUS

## 2011-03-31 MED ORDER — PROMETHAZINE HCL 25 MG/ML IJ SOLN: 6.2500 mg | INTRAMUSCULAR | Status: DC | PRN

## 2011-03-31 MED ORDER — BISACODYL 10 MG RE SUPP: 10.0000 mg | Freq: Every day | RECTAL | Status: DC | PRN

## 2011-03-31 MED ORDER — PREDNISOLONE 5 MG PO TABS: 20.0000 mg | ORAL_TABLET | Freq: Two times a day (BID) | ORAL | Status: DC

## 2011-03-31 MED ORDER — PREDNISONE 20 MG PO TABS
20.0000 mg | ORAL_TABLET | Freq: Two times a day (BID) | ORAL | Status: AC
  Administered 2011-04-01 (×2): 20 mg via ORAL
  Filled 2011-03-31 (×2): qty 1

## 2011-03-31 MED ORDER — ACETAMINOPHEN 10 MG/ML IV SOLN
1000.0000 mg | Freq: Four times a day (QID) | INTRAVENOUS | Status: AC
  Administered 2011-04-01 (×3): 1000 mg via INTRAVENOUS
  Filled 2011-03-31 (×4): qty 100

## 2011-03-31 MED ORDER — OXYCODONE HCL 5 MG PO TABS
5.0000 mg | ORAL_TABLET | ORAL | Status: DC | PRN
  Administered 2011-04-01: 10 mg via ORAL
  Administered 2011-04-01: 5 mg via ORAL
  Administered 2011-04-01 – 2011-04-02 (×3): 10 mg via ORAL
  Administered 2011-04-03 (×2): 5 mg via ORAL
  Filled 2011-03-31 (×2): qty 1
  Filled 2011-03-31: qty 2
  Filled 2011-03-31: qty 1
  Filled 2011-03-31 (×3): qty 2

## 2011-03-31 MED ORDER — LORAZEPAM 0.5 MG PO TABS
0.5000 mg | ORAL_TABLET | Freq: Two times a day (BID) | ORAL | Status: DC | PRN
  Administered 2011-04-01 – 2011-04-02 (×2): 0.5 mg via ORAL
  Filled 2011-03-31 (×2): qty 1

## 2011-03-31 MED ORDER — DIPHENHYDRAMINE HCL 12.5 MG/5ML PO ELIX: 12.5000 mg | ORAL_SOLUTION | ORAL | Status: DC | PRN

## 2011-03-31 MED ORDER — BISACODYL 5 MG PO TBEC: 10.0000 mg | DELAYED_RELEASE_TABLET | Freq: Every day | ORAL | Status: DC | PRN

## 2011-03-31 MED ORDER — DEXTROSE-NACL 5-0.45 % IV SOLN
INTRAVENOUS | Status: DC
  Administered 2011-04-01: 12:00:00 via INTRAVENOUS

## 2011-03-31 MED ORDER — NALOXONE HCL 0.4 MG/ML IJ SOLN
0.4000 mg | INTRAMUSCULAR | Status: DC | PRN
  Filled 2011-03-31: qty 1

## 2011-03-31 MED ORDER — ACETAMINOPHEN 10 MG/ML IV SOLN
INTRAVENOUS | Status: AC
  Filled 2011-03-31: qty 100

## 2011-03-31 MED ORDER — BUPIVACAINE ON-Q PAIN PUMP (FOR ORDER SET NO CHG): INJECTION | Freq: Once | Status: DC

## 2011-03-31 MED ORDER — OXYBUTYNIN CHLORIDE ER 10 MG PO TB24
10.0000 mg | ORAL_TABLET | Freq: Every day | ORAL | Status: DC | PRN
  Filled 2011-03-31: qty 1

## 2011-03-31 MED ORDER — ACETAMINOPHEN 10 MG/ML IV SOLN
INTRAVENOUS | Status: DC | PRN
  Administered 2011-03-31: 1000 mg via INTRAVENOUS

## 2011-03-31 MED ORDER — PREDNISOLONE 5 MG PO TABS: 5.0000 mg | ORAL_TABLET | Freq: Two times a day (BID) | ORAL | Status: DC

## 2011-03-31 MED ORDER — RIVAROXABAN 10 MG PO TABS
10.0000 mg | ORAL_TABLET | ORAL | Status: DC
  Administered 2011-04-01 – 2011-04-03 (×3): 10 mg via ORAL
  Filled 2011-03-31 (×3): qty 1

## 2011-03-31 MED ORDER — FENTANYL CITRATE 0.05 MG/ML IJ SOLN
INTRAMUSCULAR | Status: DC | PRN
  Administered 2011-03-31: 100 ug via INTRAVENOUS

## 2011-03-31 MED ORDER — HYDROMORPHONE HCL PF 1 MG/ML IJ SOLN: 0.2500 mg | INTRAMUSCULAR | Status: DC | PRN

## 2011-03-31 MED ORDER — MIDAZOLAM HCL 5 MG/5ML IJ SOLN
INTRAMUSCULAR | Status: DC | PRN
  Administered 2011-03-31: 2 mg via INTRAVENOUS

## 2011-03-31 MED ORDER — PREDNISONE 5 MG PO TABS: 5.0000 mg | ORAL_TABLET | Freq: Every day | ORAL | Status: DC

## 2011-03-31 MED ORDER — DIPHENHYDRAMINE HCL 12.5 MG/5ML PO ELIX
12.5000 mg | ORAL_SOLUTION | Freq: Four times a day (QID) | ORAL | Status: DC | PRN
  Filled 2011-03-31: qty 5

## 2011-03-31 SURGICAL SUPPLY — 58 items
BAG ZIPLOCK 12X15 (MISCELLANEOUS) ×2 IMPLANT
BANDAGE ELASTIC 6 VELCRO ST LF (GAUZE/BANDAGES/DRESSINGS) ×2 IMPLANT
BANDAGE ESMARK 6X9 LF (GAUZE/BANDAGES/DRESSINGS) ×1 IMPLANT
BLADE SAG 18X100X1.27 (BLADE) ×2 IMPLANT
BLADE SAW SGTL 11.0X1.19X90.0M (BLADE) ×2 IMPLANT
BNDG ESMARK 6X9 LF (GAUZE/BANDAGES/DRESSINGS) ×2
BOWL SMART MIX CTS (DISPOSABLE) ×2 IMPLANT
CATH KIT ON-Q SILVERSOAK 5IN (CATHETERS) ×2 IMPLANT
CEMENT HV SMART SET (Cement) ×4 IMPLANT
CEMENT TIBIA MBT SIZE 2.5 (Knees) ×1 IMPLANT
CLOSURE STERI STRIP 1/2 X4 (GAUZE/BANDAGES/DRESSINGS) ×2 IMPLANT
CLOTH BEACON ORANGE TIMEOUT ST (SAFETY) ×2 IMPLANT
CUFF TOURN SGL QUICK 34 (TOURNIQUET CUFF) ×1
CUFF TRNQT CYL 34X4X40X1 (TOURNIQUET CUFF) ×1 IMPLANT
DRAPE EXTREMITY T 121X128X90 (DRAPE) ×2 IMPLANT
DRAPE POUCH INSTRU U-SHP 10X18 (DRAPES) ×2 IMPLANT
DRAPE U-SHAPE 47X51 STRL (DRAPES) ×2 IMPLANT
DRSG ADAPTIC 3X8 NADH LF (GAUZE/BANDAGES/DRESSINGS) ×2 IMPLANT
DRSG TEGADERM 4X4.75 (GAUZE/BANDAGES/DRESSINGS) ×2 IMPLANT
DURAPREP 26ML APPLICATOR (WOUND CARE) ×2 IMPLANT
ELECT REM PT RETURN 9FT ADLT (ELECTROSURGICAL) ×2
ELECTRODE REM PT RTRN 9FT ADLT (ELECTROSURGICAL) ×1 IMPLANT
EVACUATOR 1/8 PVC DRAIN (DRAIN) ×2 IMPLANT
FACESHIELD LNG OPTICON STERILE (SAFETY) ×10 IMPLANT
GLOVE BIO SURGEON STRL SZ7.5 (GLOVE) ×2 IMPLANT
GLOVE BIO SURGEON STRL SZ8 (GLOVE) ×2 IMPLANT
GLOVE BIOGEL PI IND STRL 8 (GLOVE) ×2 IMPLANT
GLOVE BIOGEL PI INDICATOR 8 (GLOVE) ×2
GOWN PREVENTION PLUS XLARGE (GOWN DISPOSABLE) ×2 IMPLANT
GOWN STRL REIN XL XLG (GOWN DISPOSABLE) ×2 IMPLANT
HANDPIECE INTERPULSE COAX TIP (DISPOSABLE) ×1
IMMOBILIZER KNEE 20 (SOFTGOODS) ×2
IMMOBILIZER KNEE 20 THIGH 36 (SOFTGOODS) ×1 IMPLANT
IMPL FEMUR SIGMA LT PS SZ 3 (Knees) ×1 IMPLANT
IMPLANT FEMUR SIGMA LT PS SZ 3 (Knees) ×2 IMPLANT
KIT BASIN OR (CUSTOM PROCEDURE TRAY) ×2 IMPLANT
MANIFOLD NEPTUNE II (INSTRUMENTS) ×2 IMPLANT
NS IRRIG 1000ML POUR BTL (IV SOLUTION) ×2 IMPLANT
PACK TOTAL JOINT (CUSTOM PROCEDURE TRAY) ×2 IMPLANT
PAD ABD 7.5X8 STRL (GAUZE/BANDAGES/DRESSINGS) ×2 IMPLANT
PADDING CAST COTTON 6X4 STRL (CAST SUPPLIES) ×6 IMPLANT
PADDING WEBRIL 6 STERILE (GAUZE/BANDAGES/DRESSINGS) ×2 IMPLANT
PATELLA DOME PFC 35MM (Knees) ×2 IMPLANT
PLATE ROT INSERT 12.5MM SIZE 3 (Plate) ×2 IMPLANT
POSITIONER SURGICAL ARM (MISCELLANEOUS) ×2 IMPLANT
SET HNDPC FAN SPRY TIP SCT (DISPOSABLE) ×1 IMPLANT
SPONGE GAUZE 4X4 12PLY (GAUZE/BANDAGES/DRESSINGS) ×2 IMPLANT
STRIP CLOSURE SKIN 1/2X4 (GAUZE/BANDAGES/DRESSINGS) ×4 IMPLANT
SUCTION FRAZIER 12FR DISP (SUCTIONS) ×2 IMPLANT
SUT MNCRL AB 4-0 PS2 18 (SUTURE) ×2 IMPLANT
SUT PDS AB 1 CT1 27 (SUTURE) ×6 IMPLANT
SUT VIC AB 2-0 CT1 27 (SUTURE) ×3
SUT VIC AB 2-0 CT1 TAPERPNT 27 (SUTURE) ×3 IMPLANT
TIBIA MBT CEMENT SIZE 2.5 (Knees) ×2 IMPLANT
TOWEL OR 17X26 10 PK STRL BLUE (TOWEL DISPOSABLE) ×4 IMPLANT
TRAY FOLEY CATH 14FRSI W/METER (CATHETERS) ×2 IMPLANT
WATER STERILE IRR 1500ML POUR (IV SOLUTION) ×2 IMPLANT
WRAP KNEE MAXI GEL POST OP (GAUZE/BANDAGES/DRESSINGS) ×2 IMPLANT

## 2011-03-31 NOTE — H&P (View-Only) (Signed)
Eileen Jennings 03/30/2011 3:14 PM  DOB: [birth year]  History of Present Illness: The patient is a 70 year old female who comes in today for a preoperative History and Physical. The patient is scheduled for a left total knee arthroplasty to be performed by Dr. Gus Rankin. Aluisio, MD at [facility] on March 31, 2011 .  Allergies: No Known Drug Allergies.   Family History: Father. Deceased, Heart disease. suicide Mother. Deceased. Pneumonia, Age 34   Social History: Tobacco use. Former smoker. No alcohol use Marital status. Married. Living situation. Lives with spouse. Children. 2 Post-Surgical Plans. Plan for Home   Medication History: Premarin (0.3MG  Tablet, Oral) Active. PredniSONE (Pak) ( Oral) Specific dose unknown - Active. Metoprolol-Hydrochlorothiazide ( Oral) Specific dose unknown - Active. Ativan ( Oral) Specific dose unknown - Active. Cymbalta ( Oral) Specific dose unknown - Active. Ambien (10MG  Tablet, Oral) Active.   Past Surgical History: Hysterectomy (not due to cancer) - Complete. Date: 20. Rectocele/Cystocele Repair Bladder Sling. Date: 2008.   Medical History: Anxiety Disorder Hypertension Urinary Incontinence Urinary Tract Infection. History Polymyalgia Rheumatica Osteoporosis Depression   Review of Systems: General:Present- Fatigue. Not Present- Chills, Fever, Night Sweats, Weight Gain, Weight Loss and Memory Loss. Skin:Not Present- Hives, Itching, Rash, Eczema and Lesions. HEENT:Not Present- Tinnitus, Headache, Double Vision, Visual Loss, Hearing Loss and Dentures. Respiratory:Present- Chronic Cough (Sinus issues which is resolved.). Not Present- Shortness of breath with exertion, Shortness of breath at rest, Allergies and Coughing up blood. Cardiovascular:Not Present- Chest Pain, Racing/skipping heartbeats, Difficulty Breathing Lying Down, Murmur, Swelling and Palpitations. Gastrointestinal:Not  Present- Bloody Stool, Heartburn, Abdominal Pain, Vomiting, Nausea, Constipation, Diarrhea, Difficulty Swallowing, Jaundice and Loss of appetitie. Female Genitourinary:Present- Urinary frequency, Frequency and Urinating at Night. Not Present- Blood in Urine, Weak urinary stream, Discharge, Incontinence, Painful Urination, Urgency and Urinary Retention. Musculoskeletal:Present- Morning Stiffness. Not Present- Muscle Weakness, Muscle Pain, Joint Swelling, Joint Pain, Back Pain and Spasms. Neurological:Not Present- Tremor, Dizziness, Blackout spells, Paralysis, Difficulty with balance and Weakness. Psychiatric:Present- Insomnia.   Vitals: 03/30/2011 4:22 PM Weight: 158 lb Height: 63 in Weight was reported by patient. Height was reported by patient. Body Surface Area: 1.78 m Body Mass Index: 27.99 kg/m Pulse: 84 (Regular) Resp.: 16 (Unlabored) BP: 124/82 (Sitting, Right Arm, Standard)    Physical Exam: The physical exam findings are as follows:   General Mental Status - Alert, cooperative and good historian. General Appearance- pleasant. Not in acute distress. Orientation- Oriented X3. Build & Nutrition- Well nourished and Well developed.   Head and Neck Head- normocephalic, atraumatic . Neck Global Assessment- supple. no bruit auscultated on the right and no bruit auscultated on the left.   Eye Pupil- Bilateral- PERRLA. Motion- Bilateral- EOMI.   Chest and Lung Exam Auscultation: Breath sounds:- clear at anterior chest wall and - clear at posterior chest wall. Adventitious sounds:- No Adventitious sounds.   Cardiovascular Auscultation:Rhythm- Regular rate and rhythm. Heart Sounds- S1 WNL and S2 WNL. Murmurs & Other Heart Sounds:Auscultation of the heart reveals - No Murmurs.   Abdomen Palpation/Percussion:Tenderness- Abdomen is non-tender to palpation. Rigidity (guarding)- Abdomen is soft. Auscultation:Auscultation of the  abdomen reveals - Bowel sounds normal.   Female Genitourinary   Note: Not done, not pertinent to present illness  Musculoskeletal   Note: Evaluation of her left knee shows no effusion. Her hip has normal range of motion, no discomfort. Her range of motion on the left is 5 to 120. She has a varus deformity. There is marked crepitus on range of  motion. She is tender medial greater than lateral with no instability noted. Right knee - no effusion, no deformity. Range is 0 to 125. Slight tenderness, medial greater than lateral with no instability.  Assessment & Plan(DREW L Kinslei Labine, PA-C; 03/30/2011 7:24 PM) Osteoarthritis, knee (715.96) Impression: Left Knee  Note: Patient to be admitted for left total knee replacemant by Dr. Lequita Halt.

## 2011-03-31 NOTE — Anesthesia Preprocedure Evaluation (Addendum)
Anesthesia Evaluation  Patient identified by MRN, date of birth, ID band Patient awake    Reviewed: Allergy & Precautions, H&P , NPO status , Patient's Chart, lab work & pertinent test results, reviewed documented beta blocker date and time   Airway Mallampati: II TM Distance: >3 FB Neck ROM: Full    Dental No notable dental hx. (+) Caps   Pulmonary neg pulmonary ROS,  clear to auscultation  Pulmonary exam normal       Cardiovascular neg cardio ROS Regular Normal    Neuro/Psych  Neuromuscular disease Negative Neurological ROS  Negative Psych ROS   GI/Hepatic negative GI ROS, Neg liver ROS,   Endo/Other  Negative Endocrine ROS  Renal/GU negative Renal ROS  Genitourinary negative   Musculoskeletal negative musculoskeletal ROS (+)   Abdominal   Peds negative pediatric ROS (+)  Hematology negative hematology ROS (+)   Anesthesia Other Findings   Reproductive/Obstetrics negative OB ROS                          Anesthesia Physical Anesthesia Plan  ASA: II  Anesthesia Plan: Spinal   Post-op Pain Management:    Induction:   Airway Management Planned: Simple Face Mask  Additional Equipment:   Intra-op Plan:   Post-operative Plan:   Informed Consent: I have reviewed the patients History and Physical, chart, labs and discussed the procedure including the risks, benefits and alternatives for the proposed anesthesia with the patient or authorized representative who has indicated his/her understanding and acceptance.     Plan Discussed with:   Anesthesia Plan Comments:         Anesthesia Quick Evaluation

## 2011-03-31 NOTE — Transfer of Care (Signed)
Immediate Anesthesia Transfer of Care Note  Patient: Eileen Jennings  Procedure(s) Performed:  TOTAL KNEE ARTHROPLASTY  Patient Location: PACU  Anesthesia Type: Spinal  Level of Consciousness: awake, alert , oriented and patient cooperative  Airway & Oxygen Therapy: Patient Spontanous Breathing and Patient connected to face mask oxygen  Post-op Assessment: Report given to PACU RN and Post -op Vital signs reviewed and stable  Post vital signs: Reviewed and stable  Complications: No apparent anesthesia complications

## 2011-03-31 NOTE — Op Note (Signed)
Pre-operative diagnosis- Osteoarthritis  Left knee(s)  Post-operative diagnosis- Osteoarthritis Left knee(s)  Surgeon- Gus Rankin. Leland Raver, MD  Assistant- Avel Peace, PA-C   Anesthesia-  Spinal EBL-* No blood loss amount entered *  Drains Hemovac   Tourniquet time- * Missing tourniquet times found for documented tourniquets in log:  4878 *   Complications- None  Condition-PACU - hemodynamically stable.   Brief Clinical Note   Eileen Jennings is a 70 y.o. year old female with end stage OA of her left knee with progressively worsening pain and dysfunction. She has constant pain, with activity and at rest and significant functional deficits with difficulties even with ADLs. She has had extensive non-op management including analgesics, injections of cortisone and viscosupplements, and home exercise program, but remains in significant pain with significant dysfunction. She has radiographic findings of bone-on-bone changes in the medial and patellofemoral compartments and has subchondral sclerosis medially She presents now for left Total Knee Arthroplasty.     Procedure in detail---   The patient is brought into the operating room and positioned supine on the operating table. After successful administration of  Spinal,   a tourniquet is placed high on the  Left thigh(s) and the lower extremity is prepped and draped in the usual sterile fashion. Time out is performed by the operating team and then the  Left lower extremity is wrapped in Esmarch, knee flexed and the tourniquet inflated to 300 mmHg.       A midline incision is made with a ten blade through the subcutaneous tissue to the level of the extensor mechanism. A fresh blade is used to make a medial parapatellar arthrotomy. Soft tissue over the proximal medial tibia is subperiosteally elevated to the joint line with a knife and into the semimembranosus bursa with a Cobb elevator. Soft tissue over the proximal lateral tibia is elevated with attention  being paid to avoiding the patellar tendon on the tibial tubercle. The patella is everted, knee flexed 90 degrees and the ACL and PCL are removed. Findings are bone on bone medially and patellofemoral with large marginal osteophytes.        The drill is used to create a starting hole in the distal femur and the canal is thoroughly irrigated with sterile saline to remove the fatty contents. The 5 degree Left  valgus alignment guide is placed into the femoral canal and the distal femoral cutting block is pinned to remove 10 mm off the distal femur. Resection is made with an oscillating saw.      The tibia is subluxed forward and the menisci are removed. The extramedullary alignment guide is placed referencing proximally at the medial aspect of the tibial tubercle and distally along the second metatarsal axis and tibial crest. The block is pinned to remove 2mm off the more deficient medial  side. Resection is made with an oscillating saw. Size 2.5is the most appropriate size for the tibia and the proximal tibia is prepared with the modular drill and keel punch for that size.      The femoral sizing guide is placed and size 3 is most appropriate. Rotation is marked off the epicondylar axis and confirmed by creating a rectangular flexion gap at 90 degrees. The size 3 cutting block is pinned in this rotation and the anterior, posterior and chamfer cuts are made with the oscillating saw. The intercondylar block is then placed and that cut is made.      Trial size 2.5 tibial component, trial size 3  posterior stabilized femur and a 12.5  mm posterior stabilized rotating platform insert trial is placed. Full extension is achieved with excellent varus/valgus and anterior/posterior balance throughout full range of motion. The patella is everted and thickness measured to be 22  mm. Free hand resection is taken to 12 mm, a 35 template is placed, lug holes are drilled, trial patella is placed, and it tracks normally.  Osteophytes are removed off the posterior femur with the trial in place. All trials are removed and the cut bone surfaces prepared with pulsatile lavage. Cement is mixed and once ready for implantation, the size 2.5 tibial implant, size   3 posterior stabilized femoral component, and the size 35 patella are cemented in place and the patella is held with the clamp. The trial insert is placed and the knee held in full extension. All extruded cement is removed and once the cement is hard the permanent 12.5 mm posterior stabilized rotating platform insert is placed into the tibial tray.      The wound is copiously irrigated with saline solution and the extensor mechanism closed over a hemovac drain with #1 PDS suture. The tourniquet is released for a total tourniquet time of 37  minutes. Flexion against gravity is 140 degrees and the patella tracks normally. Subcutaneous tissue is closed with 2.0 vicryl and subcuticular with running 4.0 Monocryl. The catheter for the Marcaine pain pump is placed and the pump is initiated. The incision is cleaned and dried and steri-strips and a bulky sterile dressing are applied. The limb is placed into a knee immobilizer and the patient is awakened and transported to recovery in stable condition.      Please note that a surgical assistant was a medical necessity for this procedure in order to perform it in a safe and expeditious manner. Surgical assistant was necessary to retract the ligaments and vital neurovascular structures to prevent injury to them and also necessary for proper positioning of the limb to allow for anatomic placement of the prosthesis.  Gus Rankin Caliph Borowiak, MD    03/31/2011, 3:19 PM   Gus Rankin. Johnmark Geiger, MD    03/31/2011, 3:14 PM

## 2011-03-31 NOTE — Interval H&P Note (Signed)
History and Physical Interval Note:   03/31/2011   1:59 PM   Eileen Jennings  has presented today for surgery, with the diagnosis of Osteoarthritis Left Knee  The various methods of treatment have been discussed with the patient and family. After consideration of risks, benefits and other options for treatment, the patient has consented to  Procedure(s): TOTAL KNEE ARTHROPLASTY as a surgical intervention .  The patients' history has been reviewed, patient examined, no change in status, stable for surgery.  I have reviewed the patients' chart and labs.  Questions were answered to the patient's satisfaction.     Loanne Drilling  MD   Pt examined. History and physical unchanged  Gus Rankin. Kimi Bordeau, MD    03/31/2011, 1:59 PM

## 2011-03-31 NOTE — Anesthesia Postprocedure Evaluation (Signed)
  Anesthesia Post-op Note  Patient: Eileen Jennings  Procedure(s) Performed:  TOTAL KNEE ARTHROPLASTY  Patient Location: PACU  Anesthesia Type: Spinal  Level of Consciousness: awake and alert   Airway and Oxygen Therapy: Patient Spontanous Breathing  Post-op Pain: mild  Post-op Assessment: Post-op Vital signs reviewed, Patient's Cardiovascular Status Stable, Respiratory Function Stable, Patent Airway and No signs of Nausea or vomiting  Post-op Vital Signs: stable  Complications: No apparent anesthesia complications; moving both feet

## 2011-03-31 NOTE — Anesthesia Procedure Notes (Addendum)
Spinal  Start time: 03/31/2011 2:10 PM Staffing Anesthesiologist: Azell Der CRNA/Resident: [place], Mj Willis C Performed by: resident/CRNA  Spinal Block Patient position: sitting Patient monitoring: heart rate, continuous pulse ox and blood pressure Approach: midline Location: L3-4 Injection technique: single-shot Needle Needle type: Whitacre  Needle gauge: 22 G Needle length: 9 cm Assessment Sensory level: T4 Additional Notes Clear CSF before and after injection, No parasthesia.  Pt. Tolerated well. 1.7 ml 0.75% Bupivicaine with epi wash

## 2011-03-31 NOTE — Preoperative (Signed)
Beta Blockers   Reason not to administer Beta Blockers:Not Applicable 

## 2011-03-31 NOTE — Interval H&P Note (Signed)
History and Physical Interval Note:   03/31/2011   1:56 PM   Eileen Jennings  has presented today for surgery, with the diagnosis of Osteoarthritis Left Knee  The various methods of treatment have been discussed with the patient and family. After consideration of risks, benefits and other options for treatment, the patient has consented to  Procedure(s): TOTAL KNEE ARTHROPLASTY as a surgical intervention .  The patients' history has been reviewed, patient examined, no change in status, stable for surgery.  I have reviewed the patients' chart and labs.  Questions were answered to the patient's satisfaction.     Loanne Drilling  MD    Pt examined. History and physical unchanged  Gus Rankin. Meggan Dhaliwal, MD    03/31/2011, 1:56 PM

## 2011-03-31 NOTE — Progress Notes (Signed)
Orthopedic Tech Progress Note Patient Details:  Eileen Jennings Apr 21, 1941 [id number]  CPM Left Knee CPM Left Knee: On Left Knee Flexion (Degrees): 40  Left Knee Extension (Degrees): 5  Additional Comments: cpm applied - 4 to 6 hours   Kizzie Fantasia 03/31/2011, 4:42 PM

## 2011-04-01 LAB — BASIC METABOLIC PANEL
BUN: 5 mg/dL — ABNORMAL LOW (ref 6–23)
Creatinine, Ser: 0.58 mg/dL (ref 0.50–1.10)
GFR calc non Af Amer: >90 mL/min (ref >90)
Glucose, Bld: 151 mg/dL — ABNORMAL HIGH (ref 70–99)
Potassium: 4.7 mEq/L (ref 3.5–5.1)

## 2011-04-01 LAB — CBC
HCT: 30 % — ABNORMAL LOW (ref 36.0–46.0)
MCHC: 32.3 g/dL (ref 30.0–36.0)
Platelets: 390 10*3/uL (ref 150–400)
RDW: 15.5 % (ref 11.5–15.5)
WBC: 10.5 10*3/uL (ref 4.0–10.5)

## 2011-04-01 MED ORDER — MORPHINE SULFATE 2 MG/ML IJ SOLN
1.0000 mg | INTRAMUSCULAR | Status: DC | PRN
  Administered 2011-04-01: 1 mg via INTRAVENOUS
  Filled 2011-04-01: qty 1

## 2011-04-01 NOTE — Progress Notes (Signed)
Physical Therapy Cancel Note Patient refused to participate in physical therapy today. She complained of nausea and not feeling well.  Agreed to participate in AM.  Will check back tomorrow.  Thanks!

## 2011-04-01 NOTE — Progress Notes (Signed)
Physical Therapy Evaluation Patient Details Name: Eileen Jennings MRN: [id number] DOB: [birth year] Today's Date: 04/01/2011 1010-1045 EV2  Problem List:  Patient Active Problem List  Diagnoses  . Osteoarthritis of knee    Past Medical History:  Past Medical History  Diagnosis Date  . PONV (postoperative nausea and vomiting)   . Hypertension     takes Metoprolol  . Blood transfusion age 70    tonsillectomy as child  . Headache     takes Goody powders  . Arthritis     Hx osteoarthritis  . Anxiety     takes Ativan  . Anemia     comes and goes  . Depression     takes Cymbalta  . Fibromyalgia     pt. had polymyalgia not fibromyalgia   Past Surgical History:  Past Surgical History  Procedure Date  . Tonsillectomy     age 70  . Abdominal hysterectomy 20 yrs. ago  . Rectocele repair age 89  . Incontinence surgery 4 yrs. ago  . Cystocele repair age 74 with rectocele    PT Assessment/Plan/Recommendation PT Assessment Clinical Impression Statement: Patient s/p left TKA presents with need for skilled PT in the acute care setting to address decreased ROM, strength, decreased mobility, decreased activity tolerance and acuete pain.  This will enable her to discharge back to her home environment with family assistance and HHPT. PT Recommendation/Assessment: Patient will need skilled PT in the acute care venue PT Problem List: Decreased strength;Decreased range of motion;Decreased activity tolerance;Pain;Decreased mobility PT Therapy Diagnosis : Difficulty walking;Acute pain PT Plan PT Frequency: 7X/week PT Treatment/Interventions: Functional mobility training;Therapeutic exercise;Stair training;Gait training;DME instruction;Patient/family education PT Recommendation Follow Up Recommendations: Home health PT Equipment Recommended:  ( To be assessed after spouse inspects equipment) PT Goals  Acute Rehab PT Goals PT Goal Formulation: With patient Time For Goal Achievement: 7  days Pt will go Supine/Side to Sit: with supervision PT Goal: Supine/Side to Sit - Progress: Progressing toward goal Pt will go Sit to Supine/Side: with supervision PT Goal: Sit to Supine/Side - Progress: Progressing toward goal Pt will Transfer Sit to Stand/Stand to Sit: with supervision PT Transfer Goal: Sit to Stand/Stand to Sit - Progress: Progressing toward goal Pt will Ambulate: with supervision;51 - 150 feet;with rolling walker PT Goal: Ambulate - Progress: Progressing toward goal Pt will Go Up / Down Stairs: 1-2 stairs;with min assist;with rolling walker PT Goal: Up/Down Stairs - Progress: Progressing toward goal Pt will Perform Home Exercise Program: with supervision, verbal cues required/provided PT Goal: Perform Home Exercise Program - Progress: Progressing toward goal  PT Evaluation Precautions/Restrictions  Precautions Precautions: Knee Required Braces or Orthoses: Yes Knee Immobilizer: On except when in CPM;On when out of bed or walking;Discontinue once straight leg raise with < 10 degree lag Restrictions Weight Bearing Restrictions: Yes LLE Weight Bearing: Weight bearing as tolerated Prior Functioning  Home Living Lives With: Spouse Receives Help From: Family Type of Home: House Home Layout: One level Home Access: Stairs to enter Entrance Stairs-Rails: None Entrance Stairs-Number of Steps: 1 Bathroom Shower/Tub: Psychologist, counselling;Tub/shower unit Bathroom Toilet: Standard Home Adaptive Equipment: Built-in shower seat;Bedside commode/3-in-1;Walker - rolling Additional Comments: Patient states equipment in storage.  Spouse to inspect to ensure useable and with wheels. Prior Function Level of Independence: Independent with basic ADLs;Independent with gait Cognition Cognition Arousal/Alertness: Awake/alert Overall Cognitive Status: Appears within functional limits for tasks assessed Orientation Level: Oriented X4 Sensation/Coordination Sensation Light Touch: Not  tested Extremity Assessment RUE Assessment RUE Assessment: Not tested  LUE Assessment LUE Assessment: Not tested RLE Assessment RLE Assessment: Within Functional Limits LLE Assessment LLE Assessment: Exceptions to WFL LLE AROM (degrees) Left Knee Extension 0-130: -5  (approximate AAROM) Left Knee Flexion 0-140: 40  (approximate AAROM) LLE Strength Left Hip Flexion: 2+/5 Left Knee Flexion: 3-/5 Left Knee Extension: 3-/5 Mobility (including Balance) Bed Mobility Bed Mobility: Yes Supine to Sit: 4: Min assist;HOB elevated (Comment degrees) (HOB 30-40 degrees) Supine to Sit Details (indicate cue type and reason): assist for left LE and cues for technique Sitting - Scoot to Edge of Bed: 4: Min assist Sitting - Scoot to [place] of Bed Details (indicate cue type and reason): for left LE Transfers Transfers: Yes Sit to Stand: 3: Mod assist;From bed;With upper extremity assist Sit to Stand Details (indicate cue type and reason): cues for technique/hand placement for safety Stand to Sit: 4: Min assist;To chair/3-in-1;With upper extremity assist;With armrests Stand to Sit Details: cues for technique, left leg out Ambulation/Gait Ambulation/Gait: Yes Ambulation/Gait Assistance: 4: Min assist Ambulation/Gait Assistance Details (indicate cue type and reason): cues for squence, distance for walker  Ambulation Distance (Feet): 40 Feet Assistive device: Rolling walker Gait Pattern: Step-to pattern;Decreased step length - right;Antalgic;Trunk flexed  Posture/Postural Control Posture/Postural Control: No significant limitations Balance Balance Assessed: No Exercise  Total Joint Exercises Ankle Circles/Pumps: AROM;Both;10 reps;Supine Quad Sets: AROM;Both;10 reps;Supine Heel Slides: AAROM;Left;10 reps;Supine Straight Leg Raises: AAROM;Left;5 reps;Supine End of Session PT - End of Session Equipment Utilized During Treatment: Gait belt Activity Tolerance: Patient tolerated treatment  well Patient left: in chair;with call bell in reach General Behavior During Session: [facility] for tasks performed Cognition: [facility] for tasks performed  [facility] 04/01/2011, 11:12 AM

## 2011-04-01 NOTE — Progress Notes (Signed)
Subjective: 1 Day Post-Op Procedure(s) (LRB): TOTAL KNEE ARTHROPLASTY (Left) Patient reports pain as moderate pain last night, but doing much better this morning. Patient seen in rounds with Dr. Lequita Halt. Patient has complaints of mostly knee pain.  Objective: Vital signs in last 24 hours: Temp:  [96.7 F (35.9 C)-98.7 F (37.1 C)] 98.2 F (36.8 C) (11/08 0555) Pulse Rate:  [18-104] 104  (11/08 0555) Resp:  [9-21] 10  (11/08 0555) BP: (73-164)/(46-120) 156/90 mmHg (11/08 0555) SpO2:  [97 %-100 %] 100 % (11/08 0555) Weight:  [71.215 kg (157 lb)] 157 lb (71.215 kg) (11/07 1811)  Intake/Output from previous day:  Intake/Output Summary (Last 24 hours) at 04/01/11 0716 Last data filed at 04/01/11 1610  Gross per 24 hour  Intake   3695 ml  Output   2075 ml  Net   1620 ml    Intake/Output this shift:     Basename 04/01/11 0429 03/29/11 1215  HGB 9.7* 12.2    Basename 04/01/11 0429 03/29/11 1215  WBC 10.5 12.4*  RBC 2.96* 3.76*  HCT 30.0* 38.2  PLT 390 476*    Basename 04/01/11 0429 03/29/11 1215  NA 131* 136  K 4.7 4.3  CL 96 100  CO2 27 25  BUN 5* 11  CREATININE 0.58 0.64  GLUCOSE 151* 91  CALCIUM 8.4 9.7    Basename 03/29/11 1215  LABPT --  INR 0.98    Exam - Neurovascular intact Sensation intact distally Intact pulses distally Dressing - clean, dry Motor function intact - moving foot and toes well on exam.  Hemovac pulled without difficulty.  Assessment/Plan: 1 Day Post-Op Procedure(s) (LRB): TOTAL KNEE ARTHROPLASTY (Left)  Advance diet Up with therapy Discharge home with home health when met goals. Past Medical History  Diagnosis Date  . PONV (postoperative nausea and vomiting)   . Hypertension     takes Metoprolol  . Blood transfusion age 70    tonsillectomy as child  . Headache     takes Goody powders  . Arthritis     Hx osteoarthritis  . Anxiety     takes Ativan  . Anemia     comes and goes  . Depression     takes Cymbalta  .  Fibromyalgia     pt. had polymyalgia not fibromyalgia    DVT Prophylaxis - Xarelto  Protocol Weight-Bearing as tolerated to left leg Plans to go home after hospital stay  Patrica Duel 04/01/2011, 7:16 AM

## 2011-04-01 NOTE — Progress Notes (Signed)
Orthopedic Tech Progress Note Patient Details:  Eileen Jennings Mar 12, 1941 [id number]  CPM Left Knee CPM Left Knee: Off Left Knee Flexion (Degrees): 40  Left Knee Extension (Degrees): 5  Additional Comments: cpm applied - 4 to 6 hours Pt does not want it again due to pain.  Tawni Carnes [facility] 04/01/2011, 6:13 PM

## 2011-04-02 LAB — BASIC METABOLIC PANEL
BUN: 4 mg/dL — ABNORMAL LOW (ref 6–23)
Calcium: 9.3 mg/dL (ref 8.4–10.5)
GFR calc Af Amer: >90 mL/min (ref >90)
GFR calc non Af Amer: >90 mL/min (ref >90)
Glucose, Bld: 142 mg/dL — ABNORMAL HIGH (ref 70–99)
Sodium: 124 mEq/L — ABNORMAL LOW (ref 135–145)

## 2011-04-02 LAB — CBC
HCT: 28.7 % — ABNORMAL LOW (ref 36.0–46.0)
Hemoglobin: 9.5 g/dL — ABNORMAL LOW (ref 12.0–15.0)
MCH: 32.9 pg (ref 26.0–34.0)
MCHC: 33.1 g/dL (ref 30.0–36.0)
RDW: 14.8 % (ref 11.5–15.5)

## 2011-04-02 MED ORDER — RIVAROXABAN 10 MG PO TABS: 10.0000 mg | ORAL_TABLET | ORAL | Status: DC

## 2011-04-02 MED ORDER — OXYCODONE HCL 5 MG PO TABS: 5.0000 mg | ORAL_TABLET | ORAL | Status: AC | PRN

## 2011-04-02 MED ORDER — POLYSACCHARIDE IRON 150 MG PO CAPS
150.0000 mg | ORAL_CAPSULE | Freq: Every day | ORAL | Status: DC
  Administered 2011-04-02 – 2011-04-03 (×2): 150 mg via ORAL
  Filled 2011-04-02 (×2): qty 1

## 2011-04-02 MED ORDER — METHOCARBAMOL 500 MG PO TABS: 500.0000 mg | ORAL_TABLET | Freq: Four times a day (QID) | ORAL | Status: AC | PRN

## 2011-04-02 NOTE — Progress Notes (Signed)
Physical Therapy Treatment Patient Details Name: Eileen Jennings MRN: [id number] DOB: Feb 09, 1941 Today's Date: 04/02/[phone] eg PT Assessment/Plan  PT - Assessment/Plan Comments on Treatment Session: pt progress is much better today. PT Plan: Discharge plan remains appropriate;Frequency remains appropriate PT Frequency: 7X/week Follow Up Recommendations: Home health PT Equipment Recommended: None recommended by PT PT Goals  Acute Rehab PT Goals PT Goal: Supine/Side to Sit - Progress: Progressing toward goal PT Goal: Sit to Supine/Side - Progress: Progressing toward goal PT Transfer Goal: Sit to Stand/Stand to Sit - Progress: Progressing toward goal PT Goal: Ambulate - Progress: Progressing toward goal PT Goal: Up/Down Stairs - Progress: Progressing toward goal PT Goal: Perform Home Exercise Program - Progress: Progressing toward goal  PT Treatment Precautions/Restrictions  Precautions Precautions: Knee Required Braces or Orthoses: Yes Knee Immobilizer: Discontinue once straight leg raise with < 10 degree lag Restrictions Weight Bearing Restrictions: Yes LLE Weight Bearing: Weight bearing as tolerated Mobility (including Balance) Bed Mobility Supine to Sit: 4: Min assist;HOB elevated (Comment degrees) (30) Supine to Sit Details (indicate cue type and reason): Asist to support LLE Sitting - Scoot to Edge of Bed: 4: Min assist Transfers Sit to Stand: 4: Min assist;From chair/3-in-1;With upper extremity assist;From bed Sit to Stand Details (indicate cue type and reason): vc to push off of bed wit LUE Stand to Sit: 4: Min assist;To chair/3-in-1 Stand to Sit Details: vc to square self and reach back to recliner Ambulation/Gait Ambulation/Gait: Yes Ambulation/Gait Assistance: 4: Min assist Ambulation/Gait Assistance Details (indicate cue type and reason): VC for hand placement on RW, sequence, pt pivoted to [facility] with min Assist Ambulation Distance (Feet): 20 Feet Assistive  device: Rolling walker Gait Pattern: Step-to pattern (pt reports she has a standard walker with tennis balls ) Gait velocity: slow, steady    Exercise  Total Joint Exercises Quad Sets: AROM;10 reps;Left;Supine Heel Slides: AAROM;Left;Supine;15 reps Hip ABduction/ADduction: AAROM;Left;10 reps;Supine Straight Leg Raises: AAROM;Left;15 reps;Supine End of Session PT - End of Session Equipment Utilized During Treatment: Left knee immobilizer Activity Tolerance: Patient tolerated treatment well Patient left: in chair;with family/visitor present Nurse Communication:  (for low BP after ambulation) General Behavior During Session: [facility] for tasks performed Cognition: [facility] for tasks performed  Rada Hay 04/02/2011, 1:24 PM

## 2011-04-02 NOTE — Progress Notes (Signed)
Subjective: 2 Days Post-Op Procedure(s) (LRB): TOTAL KNEE ARTHROPLASTY (Left) Patient reports pain as moderate.   Patient has complaints of pain left knee which is now well controlled with analgesics  Objective: Vital signs in last 24 hours: Temp:  [97.7 F (36.5 C)-98.7 F (37.1 C)] 98.4 F (36.9 C) (11/09 0605) Pulse Rate:  [91-105] 91  (11/09 0932) Resp:  [16-18] 18  (11/09 0605) BP: (92-164)/(58-87) 92/58 mmHg (11/09 0932) SpO2:  [93 %-96 %] 96 % (11/09 0605)  Intake/Output from previous day:  Intake/Output Summary (Last 24 hours) at 04/02/11 1111 Last data filed at 04/02/11 1028  Gross per 24 hour  Intake 919.33 ml  Output   3000 ml  Net -2080.67 ml    Intake/Output this shift: Total I/O In: 426 [P.O.:360; I.V.:66] Out: 250 [Urine:250]   Basename 04/02/11 0440 04/01/11 0429  HGB 9.5* 9.7*    Basename 04/02/11 0440 04/01/11 0429  WBC 16.6* 10.5  RBC 2.89* 2.96*  HCT 28.7* 30.0*  PLT 400 390    Basename 04/02/11 0440 04/01/11 0429  NA 124* 131*  K 4.1 4.7  CL 90* 96  CO2 24 27  BUN 4* 5*  CREATININE 0.51 0.58  GLUCOSE 142* 151*  CALCIUM 9.3 8.4   No results found for this basename: LABPT:2,INR:2 in the last 72 hours  Exam - Neurologically intact Neurovascular intact Dorsiflexion/Plantar flexion intact Incision: no drainage Compartment soft Dressing/Incision - clean, dry, no drainage, no erythema or exudate   Assessment/Plan: 2 Days Post-Op Procedure(s) (LRB): TOTAL KNEE ARTHROPLASTY (Left)  Advance diet Up with therapy D/C IV fluids Plan for discharge tomorrow  Past Medical History  Diagnosis Date  . PONV (postoperative nausea and vomiting)   . Hypertension     takes Metoprolol  . Blood transfusion age 70    tonsillectomy as child  . Headache     takes Goody powders  . Arthritis     Hx osteoarthritis  . Anxiety     takes Ativan  . Anemia     comes and goes  . Depression     takes Cymbalta  . Fibromyalgia     pt. had  polymyalgia not fibromyalgia    DVT Prophylaxis - Xarelto  Protocol Weight-Bearing as tolerated to left leg  Camella Seim V 04/02/2011, 11:11 AM

## 2011-04-02 NOTE — Plan of Care (Signed)
Problem: Consults Goal: Diagnosis- Total Joint Replacement Primary Total Knee     

## 2011-04-02 NOTE — Discharge Summary (Signed)
Physician Discharge Summary   Patient ID: Eileen Jennings MRN: [id number] DOB/AGE: Sep 16, 1940 70 y.o.  Admit date: 03/31/2011 Planned Discharge date: 04/03/2011  Primary Diagnosis:  Osteoarthritis left knee   Admission Diagnoses: Anxiety Disorder  Hypertension  Urinary Incontinence  Urinary Tract Infection. History  Polymyalgia Rheumatica  Osteoporosis  Depression  Discharge Diagnoses:  Active Problems:  Osteoarthritis of knee Mild Acute Blood Loss Anemia Postop Hyponatremia  Procedure: Procedure(s) (LRB): TOTAL KNEE ARTHROPLASTY (Left)   Consults: none  HPI:  Eileen Jennings is a 70 y.o. year old female with end stage OA of her left knee with progressively worsening pain and dysfunction. She has constant pain, with activity and at rest and significant functional deficits with difficulties even with ADLs. She has had extensive non-op management including analgesics, injections of cortisone and viscosupplements, and home exercise program, but remains in significant pain with significant dysfunction. She has radiographic findings of bone-on-bone changes in the medial and patellofemoral compartments and has subchondral sclerosis medially She presents now for left Total Knee Arthroplasty.   Laboratory Data: [facility]  04/02/11 0440  04/01/11 0429   HGB  9.5*  9.7*     Basename  04/02/11 0440  04/01/11 0429   WBC  16.6*  10.5   RBC  2.89*  2.96*   HCT  28.7*  30.0*   PLT  400  390     Basename  04/02/11 0440  04/01/11 0429   NA  124*  131*   K  4.1  4.7   CL  90*  96   CO2  24  27   BUN  4*  5*   CREATININE  0.51  0.58   GLUCOSE  142*  151*   CALCIUM  9.3  8.4     X-Rays:Dg Chest 2 View  03/29/2011  *RADIOLOGY REPORT*  Clinical Data: Preop radiograph  CHEST - 2 VIEW  Comparison: 03/13/2007  Findings: Heart size is normal.  No pleural effusion or pulmonary edema  No airspace consolidation identified.  Calcified granuloma in the right lung is noted.  IMPRESSION:  1.  No  active cardiopulmonary abnormalities.  Original Report Authenticated By: Rosealee Albee, M.D.   Mm Digital Screening  03/12/2011  DG SCREEN MAMMOGRAM BILATERAL Bilateral CC and MLO view(s) were taken. Technologist: Antonieta Pert  DIGITAL SCREENING MAMMOGRAM WITH CAD: There are scattered fibroglandular densities.  No masses or malignant type calcifications are  identified.  Compared with prior studies.  Images were processed with CAD.  IMPRESSION: No specific mammographic evidence of malignancy.  Next screening mammogram is recommended in one  year.  A result letter of this screening mammogram will be mailed directly to the patient.  ASSESSMENT: Negative - BI-RADS 1  Screening mammogram in 1 year. ,    EKG: Orders placed in visit on 03/29/11  . EKG 12-LEAD    Hospital Course:  Patient was admitted to [facility] and taken to the OR and underwent the above state procedure without complications.  Patient tolerated the procedure well and was later transferred to the recovery room and then to the orthopaedic floor for postoperative care.  They were given PO and IV analgesics for pain control following their surgery.  They were given 24 hours of postoperative antibiotics and started on DVT prophylaxis.   PT and OT were ordered for total joint protocol.  Discharge planning consulted to help with postop disposition and equipment needs.  Patient had a tough night on the evening of surgery  and started to get up with therapy on day one.  PCA was discontinued and they were weaned over to PO meds.  Hemovac drain was pulled without difficulty.  Continued to progress with therapy into day two.  Dressing was changed on day two and the incision was healing well.  She was seen in rounds on day two and doing better.  It was felt that she would be ready to go on Saturday.  Setting up discharge and as long as she was doing well on rounds then will discharge home on the weekend.   Discharge Medications: Oxy  IR, Robaxin, Xarelto Continue Also:  Colace, Cymbalta, Ativan, Metoprolol, Oxybutynin, Prednisone, Ambien  Diet:general  Activity:WBAT  Follow-up: in 2 weeks  Disposition: Home  Discharged Condition: good       Signed: PERKINS, ALEXZANDREW 04/02/2011, 1:06 PM

## 2011-04-03 LAB — BASIC METABOLIC PANEL
Calcium: 8.8 mg/dL (ref 8.4–10.5)
GFR calc Af Amer: >90 mL/min (ref >90)
GFR calc non Af Amer: 89 mL/min — ABNORMAL LOW (ref >90)
Sodium: 130 mEq/L — ABNORMAL LOW (ref 135–145)

## 2011-04-03 LAB — CBC
MCH: 32.4 pg (ref 26.0–34.0)
MCHC: 32.4 g/dL (ref 30.0–36.0)
Platelets: 387 10*3/uL (ref 150–400)

## 2011-04-03 NOTE — Progress Notes (Signed)
Cm spoke with pt concerning d/c planning. Pt has DME. [employer] to provide HHPt. MD order in EPIC.

## 2011-04-03 NOTE — Progress Notes (Signed)
Occupational Therapy Evaluation Patient Details Name: Eileen Jennings MRN: [id number] DOB: Nov 10, 1940 Today's Date: 04/03/2011 Time in: 10:04 am Time out: 10:41 am  Problem List:  Patient Active Problem List  Diagnoses  . Osteoarthritis of knee    Past Medical History:  Past Medical History  Diagnosis Date  . PONV (postoperative nausea and vomiting)   . Hypertension     takes Metoprolol  . Blood transfusion age 70    tonsillectomy as child  . Headache     takes Goody powders  . Arthritis     Hx osteoarthritis  . Anxiety     takes Ativan  . Anemia     comes and goes  . Depression     takes Cymbalta  . Fibromyalgia     pt. had polymyalgia not fibromyalgia   Past Surgical History:  Past Surgical History  Procedure Date  . Tonsillectomy     age 70  . Abdominal hysterectomy 20 yrs. ago  . Rectocele repair age 62  . Incontinence surgery 4 yrs. ago  . Cystocele repair age 48 with rectocele    OT Assessment/Plan/Recommendation OT Assessment Clinical Impression Statement: Patient to discharge today. All education completed. Spouse to assist with ADL PRN. OT Recommendation/Assessment: Patient does not need any further OT services OT Goals    OT Evaluation Precautions/Restrictions  Precautions Precautions:  (safety, to call for assistance) Required Braces or Orthoses: Yes Knee Immobilizer: Discontinue once straight leg raise with < 10 degree lag Restrictions Weight Bearing Restrictions: Yes LLE Weight Bearing: Weight bearing as tolerated Prior Functioning     ADL ADL Eating/Feeding: Simulated;Independent Where Assessed - Eating/Feeding: Chair Grooming: Performed;Wash/dry hands;Minimal assistance Grooming Details (indicate cue type and reason): min guard assist Where Assessed - Grooming: Standing at sink Upper Body Bathing: Simulated;Chest;Right arm;Left arm;Abdomen;Set up Where Assessed - Upper Body Bathing: Sitting, chair;Unsupported Lower Body Bathing:  Simulated;Minimal assistance Where Assessed - Lower Body Bathing: Sit to stand from chair Upper Body Dressing: Simulated;Set up Where Assessed - Upper Body Dressing: Sitting, chair;Unsupported Lower Body Dressing: Simulated;Minimal assistance Lower Body Dressing Details (indicate cue type and reason): assist for starting clothes over left foot and don sock Where Assessed - Lower Body Dressing: Sit to stand from chair Toilet Transfer: Performed;Minimal assistance Toilet Transfer Details (indicate cue type and reason): min guard assist Toilet Transfer Method: Ambulating Toilet Transfer Equipment: Raised toilet seat with arms (or 3-in-1 over toilet) Toileting - Clothing Manipulation: Performed;Minimal assistance Toileting - Clothing Manipulation Details (indicate cue type and reason): min guard assist Where Assessed - Toileting Clothing Manipulation: Sit to stand from 3-in-1 or toilet Toileting - Hygiene: Performed;Independent Where Assessed - Toileting Hygiene: Sit on 3-in-1 or toilet Tub/Shower Transfer: Simulated;Minimal assistance Tub/Shower Transfer Details (indicate cue type and reason): assist to stead as she steps over ledge to get in and out. Tub/Shower Transfer Method: Science writer:  (patient to use 3 in 1 in shower) Equipment Used: Rolling walker (3 in 1) ADL Comments: patient tolerated session well. all education completed with patient and spouse. Spouse can assist PRN at discharge. Vision/Perception  Vision - History Baseline Vision: Wears glasses only for reading Cognition Cognition Arousal/Alertness: Awake/alert Overall Cognitive Status: Appears within functional limits for tasks assessed Orientation Level: Oriented X4 Sensation/Coordination   Extremity Assessment RUE Assessment RUE Assessment: Within Functional Limits LUE Assessment LUE Assessment: Within Functional Limits  End of Session OT - End of Session Equipment Utilized During  Treatment:  (rolling walker and 3 in 1) Activity Tolerance:  Patient tolerated treatment well Patient left: in chair;with call bell in reach General Behavior During Session: [facility] for tasks performed Cognition: [facility] for tasks performed   Lennox Laity Pager [phone] 04/03/2011, 1:05 PM

## 2011-04-03 NOTE — Progress Notes (Signed)
Physical Therapy Treatment Patient Details Name: Eileen Jennings MRN: [id number] DOB: [birth year] Today's Date: 04/03/[phone] 1G PT Assessment/Plan  PT - Assessment/Plan PT Plan: Discharge plan remains appropriate;Frequency remains appropriate Follow Up Recommendations: Home health PT Equipment Recommended: None recommended by PT PT Goals  Acute Rehab PT Goals PT Goal: Supine/Side to Sit - Progress: Progressing toward goal PT Goal: Sit to Supine/Side - Progress: Progressing toward goal PT Transfer Goal: Sit to Stand/Stand to Sit - Progress: Progressing toward goal PT Goal: Ambulate - Progress: Progressing toward goal PT Goal: Up/Down Stairs - Progress: Progressing toward goal  PT Treatment Precautions/Restrictions  Precautions Precautions:  (safety, to call for assistance) Required Braces or Orthoses: Yes Knee Immobilizer: Discontinue once straight leg raise with < 10 degree lag Restrictions Weight Bearing Restrictions: Yes LLE Weight Bearing: Weight bearing as tolerated Mobility (including Balance) Bed Mobility Supine to Sit: 5: Supervision Sitting - Scoot to Edge of Bed: 5: Supervision Transfers Sit to Stand: 5: Supervision;With upper extremity assist;From bed;From chair/3-in-1 Stand to Sit: 5: Supervision;To chair/3-in-1 Stand to Sit Details: VC to step out LLE prior to sitting, pt stood with min guard to brush her teeth  Ambulation/Gait Ambulation/Gait: Yes Ambulation/Gait Assistance:  (min guard for gait, pt. did not wear KI) Assistive device: Rolling walker Gait Pattern: Step-through pattern;Decreased step length - right Gait velocity: increased speed today with increased steadiness.    Exercise    End of Session PT - End of Session Activity Tolerance: Patient tolerated treatment well Patient left: in chair;with call bell in reach Nurse Communication: Mobility status for transfers;Mobility status for ambulation General Behavior During Session: [facility] for tasks  performed Cognition: [facility] for tasks performed  Rada Hay 04/03/2011, 8:48 AM

## 2011-04-03 NOTE — Progress Notes (Signed)
Eileen Jennings  MRN: [id number] DOB/Age: 71-May-1942 71 y.o. Physician: Jacquelyne Balint Procedure: Procedure(s) (LRB): TOTAL KNEE ARTHROPLASTY (Left)     Subjective: Doing well, has met dc goals.  Vital Signs Temp:  [98 F (36.7 C)-98.5 F (36.9 C)] 98 F (36.7 C) (11/10 0435) Pulse Rate:  [83-110] 103  (11/10 0435) Resp:  [14-18] 18  (11/10 0435) BP: (92-126)/(58-81) 126/81 mmHg (11/10 0435) SpO2:  [94 %-97 %] 94 % (11/10 0435)  Lab Results  Basename 04/03/11 0502 04/02/11 0440  WBC 13.1* 16.6*  HGB 8.3* 9.5*  HCT 25.6* 28.7*  PLT 387 400   BMET  Basename 04/03/11 0502 04/02/11 0440  NA 130* 124*  K 3.7 4.1  CL 95* 90*  CO2 25 24  GLUCOSE 141* 142*  BUN 9 4*  CREATININE 0.61 0.51  CALCIUM 8.8 9.3   INR  Date Value Range Status  03/29/2011 0.98  0.00-1.49 (no units) Final     Exam NVI  Drsg dry  Abd soft        Plan Dc home, see discharge summary  [name] for Dr.Kevin Supple 04/03/2011, 9:09 AM

## 2011-04-03 NOTE — Progress Notes (Signed)
Physical Therapy Treatment Patient Details Name: Eileen Jennings MRN: [id number] DOB: 1940-11-23 Today's Date: 04/03/2011 [phone] [place] g PT Assessment/Plan  PT - Assessment/Plan Comments on Treatment Session: Reviewed with pt,and spouse TKA exercises and gave habndout PT Plan: Discharge plan remains appropriate Follow Up Recommendations: Home health PT PT Goals  Acute Rehab PT Goals PT Goal: Supine/Side to Sit - Progress: Progressing toward goal PT Goal: Sit to Supine/Side - Progress: Progressing toward goal PT Transfer Goal: Sit to Stand/Stand to Sit - Progress: Progressing toward goal PT Goal: Ambulate - Progress: Progressing toward goal PT Goal: Up/Down Stairs - Progress: Progressing toward goal PT Goal: Perform Home Exercise Program - Progress: Progressing toward goal  PT Treatment Precautions/Restrictions  Precautions Precautions:  (safety, to call for assistance) Required Braces or Orthoses: Yes Knee Immobilizer: Discontinue once straight leg raise with < 10 degree lag Restrictions Weight Bearing Restrictions: Yes LLE Weight Bearing: Weight bearing as tolerated Mobility (including Balance) Transfers Sit to Stand: 5: Supervision;With upper extremity assist;With armrests;From chair/3-in-1 Stand to Sit: 5: Supervision;To chair/3-in-1 Ambulation/Gait Ambulation/Gait: Yes Ambulation/Gait Assistance: 5: Supervision Ambulation/Gait Assistance Details (indicate cue type and reason): VC for walker placement Ambulation Distance (Feet): 20 Feet (times 2) Assistive device: Rolling walker Gait Pattern: Step-to pattern Stairs: Yes Stairs Assistance: 4: Min assist Stairs Assistance Details (indicate cue type and reason): VC for teqhnique Stair Management Technique: No rails;Forwards Number of Stairs: 7  Height of Stairs: 6     Exercise  Total Joint Exercises Ankle Circles/Pumps: Seated End of Session PT - End of Session Activity Tolerance: Patient tolerated treatment  well Patient left: in chair;with family/visitor present Nurse Communication:  (pt has completed PT and may be discharged) General Behavior During Session: [facility] for tasks performed Cognition: [facility] for tasks performed  Rada Hay 04/03/2011, 12:50 PM

## 2011-04-05 ENCOUNTER — Encounter (HOSPITAL_COMMUNITY): Payer: Self-pay | Admitting: Orthopedic Surgery

## 2011-04-24 ENCOUNTER — Encounter (HOSPITAL_COMMUNITY): Payer: Self-pay | Admitting: *Deleted

## 2011-04-24 ENCOUNTER — Emergency Department (HOSPITAL_COMMUNITY)
Admission: EM | Admit: 2011-04-24 | Discharge: 2011-04-24 | Disposition: A | Discharge status: Home / Self Care | Payer: Medicare Other | Attending: Emergency Medicine | Admitting: Emergency Medicine

## 2011-04-24 DIAGNOSIS — R0602 Shortness of breath: Secondary | ICD-10-CM | POA: Insufficient documentation

## 2011-04-24 DIAGNOSIS — R112 Nausea with vomiting, unspecified: Secondary | ICD-10-CM | POA: Insufficient documentation

## 2011-04-24 DIAGNOSIS — I10 Essential (primary) hypertension: Secondary | ICD-10-CM | POA: Insufficient documentation

## 2011-04-24 DIAGNOSIS — Z96659 Presence of unspecified artificial knee joint: Secondary | ICD-10-CM | POA: Insufficient documentation

## 2011-04-24 DIAGNOSIS — N390 Urinary tract infection, site not specified: Secondary | ICD-10-CM | POA: Insufficient documentation

## 2011-04-24 DIAGNOSIS — R6883 Chills (without fever): Secondary | ICD-10-CM | POA: Insufficient documentation

## 2011-04-24 LAB — BASIC METABOLIC PANEL
CO2: 21 mEq/L (ref 19–32)
Chloride: 99 mEq/L (ref 96–112)
Creatinine, Ser: 0.62 mg/dL (ref 0.50–1.10)
Glucose, Bld: 114 mg/dL — ABNORMAL HIGH (ref 70–99)

## 2011-04-24 LAB — DIFFERENTIAL
Eosinophils Relative: 1 % (ref 0–5)
Lymphocytes Relative: 20 % (ref 12–46)
Lymphs Abs: 1.6 10*3/uL (ref 0.7–4.0)
Monocytes Absolute: 0.9 10*3/uL (ref 0.1–1.0)

## 2011-04-24 LAB — CBC
HCT: 29.7 % — ABNORMAL LOW (ref 36.0–46.0)
MCH: 29.7 pg (ref 26.0–34.0)
MCV: 97.1 fL (ref 78.0–100.0)
RBC: 3.06 MIL/uL — ABNORMAL LOW (ref 3.87–5.11)
WBC: 7.7 10*3/uL (ref 4.0–10.5)

## 2011-04-24 LAB — URINALYSIS, ROUTINE W REFLEX MICROSCOPIC
Bilirubin Urine: NEGATIVE
Glucose, UA: NEGATIVE mg/dL
Ketones, ur: NEGATIVE mg/dL
pH: 6.5 (ref 5.0–8.0)

## 2011-04-24 LAB — URINE MICROSCOPIC-ADD ON

## 2011-04-24 MED ORDER — CIPROFLOXACIN HCL 500 MG PO TABS: 500.0000 mg | ORAL_TABLET | Freq: Two times a day (BID) | ORAL | Status: AC

## 2011-04-24 MED ORDER — ONDANSETRON 4 MG PO TBDP: 4.0000 mg | ORAL_TABLET | Freq: Once | ORAL | Status: DC

## 2011-04-24 MED ORDER — SODIUM CHLORIDE 0.9 % IV BOLUS (SEPSIS)
500.0000 mL | Freq: Once | INTRAVENOUS | Status: AC
  Administered 2011-04-24: 500 mL via INTRAVENOUS

## 2011-04-24 MED ORDER — DEXTROSE 5 % IV SOLN
1.0000 g | Freq: Once | INTRAVENOUS | Status: AC
  Administered 2011-04-24: 1 g via INTRAVENOUS
  Filled 2011-04-24: qty 10

## 2011-04-24 NOTE — ED Notes (Signed)
Patient was given DC instructions and she gave verbal understanding V/S stable.  She was not showing any signs of distress at the time of DC DC to home with husband.

## 2011-04-24 NOTE — ED Provider Notes (Signed)
History     CSN: [id number] Arrival date & time: 04/24/2011  7:52 AM   First MD Initiated Contact with Patient 04/24/11 0815      Chief Complaint  Patient presents with  . Medication Reaction    (Consider location/radiation/quality/duration/timing/severity/associated sxs/prior treatment) The history is provided by the patient and the spouse.   the patient is a 70 year old, female.  She recently had her left knee replaced.  She had been taking a muscle relaxant and Vicodin for her pain.  It was stopped yesterday.  Since it was stopped.  She said these episodes of a wave of warm feeling going up her arms.  All the way to the top of her head associated with mild shortness of breath.  They become frequent.  She is asymptomatic however, at this time.  She denies pain, when she is having any of these episodes.  She did have some nausea with a small amount of vomiting.  She has not had a cough, fevers,  or diarrhea.  She denies chest pain.  She says she has had this intermittent feeling of being hot, followed by chills.  She denies urinary tract symptoms  Past Medical History  Diagnosis Date  . PONV (postoperative nausea and vomiting)   . Hypertension     takes Metoprolol  . Blood transfusion age 65    tonsillectomy as child  . Headache     takes Goody powders  . Arthritis     Hx osteoarthritis  . Anxiety     takes Ativan  . Anemia     comes and goes  . Depression     takes Cymbalta  . 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 Tonsillectomy     age 65  . Abdominal hysterectomy 20 yrs. ago  . Rectocele repair age 61  . Incontinence surgery 4 yrs. ago  . Cystocele repair age 77 with rectocele  . 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  History  Substance Use Topics  .  Smoking status: Former Smoker -- 0.5 packs/day for 8 years    Quit date: 03/29/1971  . Smokeless tobacco: Not on file  . Alcohol Use: No    OB History    Grav Para Term Preterm Abortions TAB SAB Ect Mult Living                  Review of Systems  Constitutional: Positive for chills. Negative for fever.  HENT: Negative for congestion.   Eyes: Negative for redness.  Respiratory: Positive for shortness of breath. Negative for cough and chest tightness.        Shortness of breath, only occurs when she is having a wave of hot feeling in her body.  It lasts only for a couple minutes  Cardiovascular: Negative for chest pain, palpitations and leg swelling.  Gastrointestinal: Positive for nausea and vomiting. Negative for diarrhea and constipation.       Nausea is resolved now  Genitourinary: Negative for dysuria.  Skin: Negative for rash.  Neurological: Negative for light-headedness and headaches.  Psychiatric/Behavioral: Negative for confusion.    Allergies  Review of patient's allergies indicates no known allergies.  Home Medications   Current Outpatient Rx  Name Route Sig Dispense Refill  . ACETAMINOPHEN  500 MG PO TABS Oral Take 500 mg by mouth every 6 (six) hours as needed. Pain     . BETAMETHASONE DIPROPIONATE 0.05 % EX CREA Topical Apply 1 application topically daily as needed. For irritation     . VITAMIN D 1000 UNITS PO TABS Oral Take 2,000 Units by mouth daily.      Marland Kitchen DIPHENHYDRAMINE-APAP (SLEEP) 25-500 MG PO TABS Oral Take 1-2 tablets by mouth at bedtime as needed. For sleep     . DULOXETINE HCL 30 MG PO CPEP Oral Take 30 mg by mouth every evening.     Marland Kitchen ESTROGENS CONJUGATED 0.3 MG PO TABS Oral Take 0.3 mg by mouth 2 (two) times daily. Take daily for 21 days then do not take for 7 days.     Di Kindle SULFATE 324 (65 FE) MG PO TBEC Oral Take 1 tablet by mouth daily. Takes as needed foe anemia     . LORAZEPAM 0.5 MG PO TABS Oral Take 0.5 mg by mouth 2 (two) times daily.  Anxiety     . METOPROLOL SUCCINATE ER 50 MG PO TB24 Oral Take 50 mg by mouth every morning.     . OXYBUTYNIN CHLORIDE ER 10 MG PO TB24 Oral Take 10 mg by mouth daily as needed. uncontrolled urination    . PREDNISONE 5 MG PO TABS Oral Take 5 mg by mouth every morning.     Marland Kitchen ZOLPIDEM TARTRATE 10 MG PO TABS Oral Take 10 mg by mouth at bedtime.       BP 140/78  Pulse 85  Temp 98 F (36.7 C)  SpO2 100%  Physical Exam  Vitals reviewed. Constitutional: She is oriented to person, place, and time. She appears well-developed and well-nourished.  HENT:  Head: Normocephalic and atraumatic.  Eyes: Pupils are equal, round, and reactive to light.  Neck: Normal range of motion.  Cardiovascular: Normal rate, regular rhythm and normal heart sounds.   No murmur heard. Pulmonary/Chest: Effort normal and breath sounds normal. No respiratory distress. She has no wheezes. She has no rales.  Abdominal: Soft. She exhibits no distension and no mass. There is no tenderness. There is no rebound and no guarding.  Musculoskeletal: Normal range of motion. She exhibits no edema and no tenderness.       Left knee surgical incision is well healed with no signs of swelling or infection.  Neurological: She is alert and oriented to person, place, and time. No cranial nerve deficit.  Skin: Skin is warm and dry. No rash noted. No erythema.  Psychiatric: She has a normal mood and affect. Her behavior is normal.    ED Course  Procedures (including critical care time)  70 year old female complains of waves of feeling warm, followed by chills.  All over her body with increasing frequency since yesterday.  She had a single episode of nausea and vomiting.  No other symptoms.  Right now.  She is asymptomatic.  We will establish an IV and give her IV fluids, and check laboratory testing, including blood and urine.   Labs Reviewed  BASIC METABOLIC PANEL  CBC  DIFFERENTIAL  URINALYSIS, ROUTINE W REFLEX MICROSCOPIC   No  results found.   Rocephin completed   MDM  uti Nontoxic.  Hemodynamically stable        Nicholes Stairs, MD 04/24/11 [phone]

## 2011-04-24 NOTE — ED Notes (Signed)
Pt had knee replacement 3 weeks ago, pt was taking a muscle relaxer and pain  Med, pt had been taking 2 Oxycodone every 4 hours and her musle relaxer as directed, Dr Corbin Ade stopped her meds on Thursday, pt now shaking, nauseated and vomiting, dry mouth.

## 2011-04-25 ENCOUNTER — Encounter (HOSPITAL_COMMUNITY): Payer: Self-pay | Admitting: Emergency Medicine

## 2011-04-25 ENCOUNTER — Emergency Department (HOSPITAL_COMMUNITY)
Admission: EM | Admit: 2011-04-25 | Discharge: 2011-04-25 | Disposition: A | Discharge status: Home / Self Care | Payer: Medicare Other | Attending: Emergency Medicine | Admitting: Emergency Medicine

## 2011-04-25 DIAGNOSIS — N390 Urinary tract infection, site not specified: Secondary | ICD-10-CM

## 2011-04-25 DIAGNOSIS — Z96659 Presence of unspecified artificial knee joint: Secondary | ICD-10-CM | POA: Insufficient documentation

## 2011-04-25 DIAGNOSIS — I10 Essential (primary) hypertension: Secondary | ICD-10-CM | POA: Insufficient documentation

## 2011-04-25 DIAGNOSIS — R000 Tachycardia, unspecified: Secondary | ICD-10-CM | POA: Insufficient documentation

## 2011-04-25 LAB — URINALYSIS, ROUTINE W REFLEX MICROSCOPIC
Nitrite: NEGATIVE
Specific Gravity, Urine: 1.009 (ref 1.005–1.030)
pH: 5.5 (ref 5.0–8.0)

## 2011-04-25 LAB — URINE MICROSCOPIC-ADD ON

## 2011-04-25 MED ORDER — LORAZEPAM 1 MG PO TABS
1.0000 mg | ORAL_TABLET | Freq: Once | ORAL | Status: AC
  Administered 2011-04-25: 1 mg via ORAL
  Filled 2011-04-25: qty 1

## 2011-04-25 MED ORDER — LORAZEPAM 1 MG PO TABS: 0.5000 mg | ORAL_TABLET | Freq: Three times a day (TID) | ORAL | Status: AC | PRN

## 2011-04-25 NOTE — ED Notes (Signed)
Pt came back tonight after vomiting "part" of her Cipro dose prescribed for UTI. Pt attempted to use SL Zofran, but was unable to keep down medicines; returned tonight because of chills, "feeling sick", and "disentary", and "just feels sick"

## 2011-04-25 NOTE — ED Notes (Signed)
Patient sts that she feels worse sine her d/c yesterday am.   Now has chills and n/v.  C/o of back ache but no cva tenderness.  sts that has still has nausea, with dry heaves

## 2011-04-25 NOTE — ED Notes (Signed)
Unable to collect urine

## 2011-04-25 NOTE — ED Notes (Signed)
Finally able to obtain u/a

## 2011-04-25 NOTE — ED Provider Notes (Signed)
History     CSN: [id number] Arrival date & time: 04/25/2011  2:14 AM   First MD Initiated Contact with Patient 04/25/11 [phone]      Chief Complaint  Patient presents with  . Urinary Tract Infection    (Consider location/radiation/quality/duration/timing/severity/associated sxs/prior treatment) HPI The patient presents for the second time in 24 hours with concerns of generalized discomfort and nausea. Notably the patient has history of left knee replacement one month ago, and narcotic use and being 3 days ago.  She notes that over the past 2-3 days she has felt progressively more generally uncomfortable with mild shakiness diffusely and nausea. She also endorses chills. She denies any consistent diarrhea or vomiting or mental status changes. She presented to this emergency department earlier and was diagnosed with urinary tract infection. She completed IV antibiotics while here and one oral dose at home, but notes that her symptoms are persistent. No clear exacerbating factors. Past Medical History  Diagnosis Date  . PONV (postoperative nausea and vomiting)   . Hypertension     takes Metoprolol  . Blood transfusion age 59    tonsillectomy as child  . Headache     takes Goody powders  . Arthritis     Hx osteoarthritis  . Anxiety     takes Ativan  . Anemia     comes and goes  . Depression     takes Cymbalta  . 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 Tonsillectomy     age 59  . Abdominal hysterectomy 20 yrs. ago  . Rectocele repair age 68  . Incontinence surgery 4 yrs. ago  . Cystocele repair age 73 with rectocele  . 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 No pertinent family history.  History  Substance Use Topics  . Smoking status: Former Smoker -- 0.5 packs/day for 8  years    Quit date: 03/29/1971  . Smokeless tobacco: Not on file  . Alcohol Use: No    OB History    Grav Para Term Preterm Abortions TAB SAB Ect Mult Living                  Review of Systems  All other systems reviewed and are negative.    Allergies  Review of patient's allergies indicates no known allergies.  Home Medications   Current Outpatient Rx  Name Route Sig Dispense Refill  . ACETAMINOPHEN 500 MG PO TABS Oral Take 500 mg by mouth every 6 (six) hours as needed. Pain     . BETAMETHASONE DIPROPIONATE 0.05 % EX CREA Topical Apply 1 application topically daily as needed. For irritation     . VITAMIN D 1000 UNITS PO TABS Oral Take 2,000 Units by mouth daily.      Marland Kitchen CIPROFLOXACIN HCL 500 MG PO TABS Oral Take 1 tablet (500 mg total) by mouth 2 (two) times daily. 20 tablet 0  . DIPHENHYDRAMINE-APAP (SLEEP) 25-500 MG PO TABS Oral Take 1-2 tablets by mouth at bedtime as needed. For sleep     . DULOXETINE HCL 30 MG PO CPEP Oral Take 30 mg by mouth every evening.     Marland Kitchen ESTROGENS CONJUGATED 0.3 MG PO TABS Oral Take 0.3 mg by mouth 2 (two) times daily. Take daily for 21 days then  do not take for 7 days.     Di Kindle SULFATE 324 (65 FE) MG PO TBEC Oral Take 1 tablet by mouth daily. Takes as needed foe anemia     . LORAZEPAM 0.5 MG PO TABS Oral Take 0.5 mg by mouth 2 (two) times daily. Anxiety     . METOPROLOL SUCCINATE ER 50 MG PO TB24 Oral Take 50 mg by mouth every morning.     . OXYBUTYNIN CHLORIDE ER 10 MG PO TB24 Oral Take 10 mg by mouth daily as needed. uncontrolled urination    . PREDNISONE 5 MG PO TABS Oral Take 5 mg by mouth every morning.     Marland Kitchen ZOLPIDEM TARTRATE 10 MG PO TABS Oral Take 10 mg by mouth at bedtime.       BP 153/88  Pulse 115  Temp(Src) 98.4 F (36.9 C) (Oral)  Resp 18  SpO2 99%  Physical Exam  Constitutional: She is oriented to person, place, and time. She appears well-nourished. No distress.  HENT:  Head: Normocephalic and atraumatic.  Eyes:  Conjunctivae are normal. Pupils are equal, round, and reactive to light.  Cardiovascular: Tachycardia present.   Pulmonary/Chest: Effort normal and breath sounds normal.  Abdominal: She exhibits no distension.  Musculoskeletal:       The left knee has a midline scar on the inferior aspect. There is mild erythema and mild warmth about the anterior aspect. Range of motion and strength of this they is symmetric to the right leg.  Neurological: She is alert and oriented to person, place, and time. No cranial nerve deficit. She exhibits normal muscle tone. Coordination normal.  Skin: Skin is warm and dry.  Psychiatric: She has a normal mood and affect.    ED Course  Procedures (including critical care time)   Labs Reviewed  URINALYSIS, ROUTINE W REFLEX MICROSCOPIC   No results found.   No diagnosis found.    MDM  This 70 year old female presents for the second time today with concerns of generalized discomfort. On exam the patient is in no distress though she is anxious appearing. The patient's labs notable for continued evidence of a UTI. Following provision of medications in the ED the patient noted significant improvement in her symptoms, and to go home. She was counseled on the necessity to continue all medications, and the need to see her physician tomorrow. She'll be discharged.     Gerhard Munch, MD 04/25/11 [phone]

## 2011-04-25 NOTE — ED Notes (Signed)
MD in to see and evaluate.   Continues with nausea.   Up to [facility] for u/a

## 2011-04-26 ENCOUNTER — Ambulatory Visit: Payer: Medicare Other | Attending: Orthopedic Surgery | Admitting: Physical Therapy

## 2011-04-26 DIAGNOSIS — R5381 Other malaise: Secondary | ICD-10-CM | POA: Insufficient documentation

## 2011-04-26 DIAGNOSIS — M25669 Stiffness of unspecified knee, not elsewhere classified: Secondary | ICD-10-CM | POA: Insufficient documentation

## 2011-04-26 DIAGNOSIS — M25569 Pain in unspecified knee: Secondary | ICD-10-CM | POA: Insufficient documentation

## 2011-04-26 DIAGNOSIS — IMO0001 Reserved for inherently not codable concepts without codable children: Secondary | ICD-10-CM | POA: Insufficient documentation

## 2011-04-28 ENCOUNTER — Ambulatory Visit: Payer: Medicare Other | Admitting: Physical Therapy

## 2011-04-29 ENCOUNTER — Ambulatory Visit: Payer: Medicare Other | Admitting: Physical Therapy

## 2011-05-03 ENCOUNTER — Ambulatory Visit: Payer: Medicare Other | Admitting: Physical Therapy

## 2011-05-05 ENCOUNTER — Ambulatory Visit: Payer: Medicare Other | Admitting: Physical Therapy

## 2011-05-06 ENCOUNTER — Ambulatory Visit: Payer: Medicare Other | Admitting: *Deleted

## 2011-05-10 ENCOUNTER — Ambulatory Visit: Payer: Medicare Other | Admitting: Physical Therapy

## 2011-05-13 ENCOUNTER — Ambulatory Visit: Payer: Medicare Other | Admitting: Physical Therapy

## 2011-05-26 ENCOUNTER — Encounter: Payer: Medicare Other | Admitting: Physical Therapy

## 2011-05-26 DIAGNOSIS — J189 Pneumonia, unspecified organism: Secondary | ICD-10-CM | POA: Diagnosis not present

## 2011-05-26 DIAGNOSIS — D649 Anemia, unspecified: Secondary | ICD-10-CM | POA: Diagnosis not present

## 2011-05-26 DIAGNOSIS — I10 Essential (primary) hypertension: Secondary | ICD-10-CM | POA: Diagnosis not present

## 2011-05-26 DIAGNOSIS — Z79899 Other long term (current) drug therapy: Secondary | ICD-10-CM | POA: Diagnosis not present

## 2011-05-28 ENCOUNTER — Ambulatory Visit: Payer: Medicare Other | Attending: Orthopedic Surgery | Admitting: Physical Therapy

## 2011-05-28 DIAGNOSIS — M25569 Pain in unspecified knee: Secondary | ICD-10-CM | POA: Insufficient documentation

## 2011-05-28 DIAGNOSIS — R5381 Other malaise: Secondary | ICD-10-CM | POA: Diagnosis not present

## 2011-05-28 DIAGNOSIS — IMO0001 Reserved for inherently not codable concepts without codable children: Secondary | ICD-10-CM | POA: Diagnosis not present

## 2011-05-28 DIAGNOSIS — M25669 Stiffness of unspecified knee, not elsewhere classified: Secondary | ICD-10-CM | POA: Diagnosis not present

## 2011-06-01 ENCOUNTER — Ambulatory Visit: Payer: Medicare Other | Admitting: Physical Therapy

## 2011-06-01 DIAGNOSIS — IMO0001 Reserved for inherently not codable concepts without codable children: Secondary | ICD-10-CM | POA: Diagnosis not present

## 2011-06-01 DIAGNOSIS — M25569 Pain in unspecified knee: Secondary | ICD-10-CM | POA: Diagnosis not present

## 2011-06-01 DIAGNOSIS — M25669 Stiffness of unspecified knee, not elsewhere classified: Secondary | ICD-10-CM | POA: Diagnosis not present

## 2011-06-01 DIAGNOSIS — R5381 Other malaise: Secondary | ICD-10-CM | POA: Diagnosis not present

## 2011-06-03 ENCOUNTER — Ambulatory Visit: Payer: Medicare Other | Admitting: Physical Therapy

## 2011-06-03 DIAGNOSIS — M25569 Pain in unspecified knee: Secondary | ICD-10-CM | POA: Diagnosis not present

## 2011-06-03 DIAGNOSIS — R5381 Other malaise: Secondary | ICD-10-CM | POA: Diagnosis not present

## 2011-06-03 DIAGNOSIS — M25669 Stiffness of unspecified knee, not elsewhere classified: Secondary | ICD-10-CM | POA: Diagnosis not present

## 2011-06-03 DIAGNOSIS — IMO0001 Reserved for inherently not codable concepts without codable children: Secondary | ICD-10-CM | POA: Diagnosis not present

## 2011-06-08 ENCOUNTER — Ambulatory Visit: Payer: Medicare Other | Admitting: Physical Therapy

## 2011-06-08 DIAGNOSIS — M25569 Pain in unspecified knee: Secondary | ICD-10-CM | POA: Diagnosis not present

## 2011-06-08 DIAGNOSIS — IMO0001 Reserved for inherently not codable concepts without codable children: Secondary | ICD-10-CM | POA: Diagnosis not present

## 2011-06-08 DIAGNOSIS — M25669 Stiffness of unspecified knee, not elsewhere classified: Secondary | ICD-10-CM | POA: Diagnosis not present

## 2011-06-08 DIAGNOSIS — R5381 Other malaise: Secondary | ICD-10-CM | POA: Diagnosis not present

## 2011-06-09 DIAGNOSIS — J189 Pneumonia, unspecified organism: Secondary | ICD-10-CM | POA: Diagnosis not present

## 2011-06-09 DIAGNOSIS — I10 Essential (primary) hypertension: Secondary | ICD-10-CM | POA: Diagnosis not present

## 2011-06-30 DIAGNOSIS — M810 Age-related osteoporosis without current pathological fracture: Secondary | ICD-10-CM | POA: Diagnosis not present

## 2011-06-30 DIAGNOSIS — M353 Polymyalgia rheumatica: Secondary | ICD-10-CM | POA: Diagnosis not present

## 2011-06-30 DIAGNOSIS — M159 Polyosteoarthritis, unspecified: Secondary | ICD-10-CM | POA: Diagnosis not present

## 2011-07-22 DIAGNOSIS — Z1331 Encounter for screening for depression: Secondary | ICD-10-CM | POA: Diagnosis not present

## 2011-07-22 DIAGNOSIS — Z23 Encounter for immunization: Secondary | ICD-10-CM | POA: Diagnosis not present

## 2011-07-22 DIAGNOSIS — Z0000 Encounter for general adult medical examination without abnormal findings: Secondary | ICD-10-CM | POA: Diagnosis not present

## 2011-07-22 DIAGNOSIS — D649 Anemia, unspecified: Secondary | ICD-10-CM | POA: Diagnosis not present

## 2011-07-22 DIAGNOSIS — F411 Generalized anxiety disorder: Secondary | ICD-10-CM | POA: Diagnosis not present

## 2011-08-12 DIAGNOSIS — Z124 Encounter for screening for malignant neoplasm of cervix: Secondary | ICD-10-CM | POA: Diagnosis not present

## 2011-08-12 DIAGNOSIS — Z0000 Encounter for general adult medical examination without abnormal findings: Secondary | ICD-10-CM | POA: Diagnosis not present

## 2011-10-07 DIAGNOSIS — M159 Polyosteoarthritis, unspecified: Secondary | ICD-10-CM | POA: Diagnosis not present

## 2011-10-07 DIAGNOSIS — M353 Polymyalgia rheumatica: Secondary | ICD-10-CM | POA: Diagnosis not present

## 2011-10-07 DIAGNOSIS — M810 Age-related osteoporosis without current pathological fracture: Secondary | ICD-10-CM | POA: Diagnosis not present

## 2011-10-21 ENCOUNTER — Emergency Department (HOSPITAL_COMMUNITY): Payer: Medicare Other

## 2011-10-21 ENCOUNTER — Emergency Department (HOSPITAL_COMMUNITY)
Admission: EM | Admit: 2011-10-21 | Discharge: 2011-10-21 | Disposition: A | Discharge status: Home / Self Care | Payer: Medicare Other | Attending: Emergency Medicine | Admitting: Emergency Medicine

## 2011-10-21 ENCOUNTER — Encounter (HOSPITAL_COMMUNITY): Payer: Self-pay | Admitting: Emergency Medicine

## 2011-10-21 DIAGNOSIS — R1011 Right upper quadrant pain: Secondary | ICD-10-CM | POA: Diagnosis not present

## 2011-10-21 DIAGNOSIS — N390 Urinary tract infection, site not specified: Secondary | ICD-10-CM

## 2011-10-21 DIAGNOSIS — R112 Nausea with vomiting, unspecified: Secondary | ICD-10-CM | POA: Insufficient documentation

## 2011-10-21 DIAGNOSIS — F419 Anxiety disorder, unspecified: Secondary | ICD-10-CM

## 2011-10-21 DIAGNOSIS — R1110 Vomiting, unspecified: Secondary | ICD-10-CM | POA: Diagnosis not present

## 2011-10-21 DIAGNOSIS — R110 Nausea: Secondary | ICD-10-CM

## 2011-10-21 DIAGNOSIS — R079 Chest pain, unspecified: Secondary | ICD-10-CM | POA: Diagnosis not present

## 2011-10-21 DIAGNOSIS — Z79899 Other long term (current) drug therapy: Secondary | ICD-10-CM | POA: Insufficient documentation

## 2011-10-21 DIAGNOSIS — F411 Generalized anxiety disorder: Secondary | ICD-10-CM | POA: Diagnosis not present

## 2011-10-21 DIAGNOSIS — R0602 Shortness of breath: Secondary | ICD-10-CM | POA: Diagnosis not present

## 2011-10-21 DIAGNOSIS — I10 Essential (primary) hypertension: Secondary | ICD-10-CM | POA: Diagnosis not present

## 2011-10-21 DIAGNOSIS — R05 Cough: Secondary | ICD-10-CM | POA: Diagnosis not present

## 2011-10-21 DIAGNOSIS — M129 Arthropathy, unspecified: Secondary | ICD-10-CM | POA: Insufficient documentation

## 2011-10-21 LAB — CBC
HCT: 34.3 % — ABNORMAL LOW (ref 36.0–46.0)
Hemoglobin: 11.3 g/dL — ABNORMAL LOW (ref 12.0–15.0)
MCH: 29 pg (ref 26.0–34.0)
MCHC: 32.9 g/dL (ref 30.0–36.0)
MCV: 87.9 fL (ref 78.0–100.0)
Platelets: 558 10*3/uL — ABNORMAL HIGH (ref 150–400)
RBC: 3.9 MIL/uL (ref 3.87–5.11)
RDW: 18.1 % — ABNORMAL HIGH (ref 11.5–15.5)
WBC: 11.4 10*3/uL — ABNORMAL HIGH (ref 4.0–10.5)

## 2011-10-21 LAB — DIFFERENTIAL
Basophils Absolute: 0 10*3/uL (ref 0.0–0.1)
Basophils Relative: 0 % (ref 0–1)
Eosinophils Absolute: 0.1 10*3/uL (ref 0.0–0.7)
Eosinophils Relative: 1 % (ref 0–5)
Lymphocytes Relative: 15 % (ref 12–46)
Lymphs Abs: 1.7 10*3/uL (ref 0.7–4.0)
Monocytes Absolute: 1.1 10*3/uL — ABNORMAL HIGH (ref 0.1–1.0)
Monocytes Relative: 10 % (ref 3–12)
Neutro Abs: 8.5 10*3/uL — ABNORMAL HIGH (ref 1.7–7.7)
Neutrophils Relative %: 75 % (ref 43–77)

## 2011-10-21 LAB — URINALYSIS, ROUTINE W REFLEX MICROSCOPIC
Bilirubin Urine: NEGATIVE
Glucose, UA: NEGATIVE mg/dL
Ketones, ur: 15 mg/dL — AB
Nitrite: POSITIVE — AB
Protein, ur: NEGATIVE mg/dL
Specific Gravity, Urine: 1.014 (ref 1.005–1.030)
Urobilinogen, UA: 0.2 mg/dL (ref 0.0–1.0)
pH: 6 (ref 5.0–8.0)

## 2011-10-21 LAB — COMPREHENSIVE METABOLIC PANEL
ALT: 9 U/L (ref 0–35)
AST: 18 U/L (ref 0–37)
Albumin: 3.8 g/dL (ref 3.5–5.2)
Alkaline Phosphatase: 64 U/L (ref 39–117)
BUN: 9 mg/dL (ref 6–23)
CO2: 22 mEq/L (ref 19–32)
Calcium: 9.8 mg/dL (ref 8.4–10.5)
Chloride: 91 mEq/L — ABNORMAL LOW (ref 96–112)
Creatinine, Ser: 0.66 mg/dL (ref 0.50–1.10)
GFR calc Af Amer: >90 mL/min (ref >90)
GFR calc non Af Amer: 87 mL/min — ABNORMAL LOW (ref >90)
Glucose, Bld: 104 mg/dL — ABNORMAL HIGH (ref 70–99)
Potassium: 3.8 mEq/L (ref 3.5–5.1)
Sodium: 130 mEq/L — ABNORMAL LOW (ref 135–145)
Total Bilirubin: 0.3 mg/dL (ref 0.3–1.2)
Total Protein: 8 g/dL (ref 6.0–8.3)

## 2011-10-21 LAB — URINE MICROSCOPIC-ADD ON

## 2011-10-21 LAB — POCT I-STAT TROPONIN I: Troponin i, poc: 0 ng/mL (ref 0.00–0.08)

## 2011-10-21 MED ORDER — SODIUM CHLORIDE 0.9 % IV BOLUS (SEPSIS)
1000.0000 mL | Freq: Once | INTRAVENOUS | Status: AC
  Administered 2011-10-21: 1000 mL via INTRAVENOUS

## 2011-10-21 MED ORDER — ONDANSETRON HCL 4 MG/2ML IJ SOLN
4.0000 mg | Freq: Once | INTRAMUSCULAR | Status: AC
  Administered 2011-10-21: 4 mg via INTRAVENOUS

## 2011-10-21 MED ORDER — LORAZEPAM 2 MG/ML IJ SOLN
INTRAMUSCULAR | Status: AC
  Filled 2011-10-21: qty 1

## 2011-10-21 MED ORDER — NITROFURANTOIN MONOHYD MACRO 100 MG PO CAPS: 100.0000 mg | ORAL_CAPSULE | Freq: Two times a day (BID) | ORAL | Status: DC

## 2011-10-21 MED ORDER — LORAZEPAM 2 MG/ML IJ SOLN
0.5000 mg | Freq: Once | INTRAMUSCULAR | Status: AC
  Administered 2011-10-21: 0.5 mg via INTRAVENOUS

## 2011-10-21 MED ORDER — LORAZEPAM 1 MG PO TABS: 0.5000 mg | ORAL_TABLET | Freq: Three times a day (TID) | ORAL | Status: AC | PRN

## 2011-10-21 MED ORDER — NITROFURANTOIN MONOHYD MACRO 100 MG PO CAPS: 100.0000 mg | ORAL_CAPSULE | Freq: Two times a day (BID) | ORAL | Status: AC

## 2011-10-21 MED ORDER — ONDANSETRON HCL 4 MG/2ML IJ SOLN
INTRAMUSCULAR | Status: AC
  Filled 2011-10-21: qty 2

## 2011-10-21 NOTE — ED Notes (Signed)
Pt has been having right sided chest pain for 2 days that she thinks is caused by some family stressors. Pt has been having difficulty sleeping due to being out of her [place] Rx. Pt also states that she vomited several times last night. Pt is pain free on arrival. Pain is intermittent and described as pressure. Pain radiates from the R side of her chest to the L side and down to left hand.

## 2011-10-21 NOTE — ED Provider Notes (Signed)
History     CSN: [id number]  Arrival date & time 10/21/11  0932   First MD Initiated Contact with Patient 10/21/11 [phone]      Chief Complaint  Patient presents with  . Chest Pain    (Consider location/radiation/quality/duration/timing/severity/associated sxs/prior treatment) HPI Patient presents to emergency room with some nausea and vomiting since last night.  Patient, states she has been out of Ambien and having difficulty sleeping sent she ran out.  Patient states that she has multiple other complaints, but she has also been anxious since she ran out of her Ativan as well. The husband states that she is a good person who does not want to bother anyone and she has been feeling like she has been over the last few days.  Patient said she had to brief episodes of right-sided pain in her chest.  This occurred over the last 2 days.  Past Medical History  Diagnosis Date  . PONV (postoperative nausea and vomiting)   . Hypertension     takes Metoprolol  . Blood transfusion age 89    tonsillectomy as child  . Headache     takes Goody powders  . Arthritis     Hx osteoarthritis  . Anxiety     takes Ativan  . Anemia     comes and goes  . Depression     takes Cymbalta  . 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 Tonsillectomy     age 89  . Abdominal hysterectomy 20 yrs. ago  . Rectocele repair age 47  . Incontinence surgery 4 yrs. ago  . Cystocele repair age 55 with rectocele  . 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  History  Substance Use Topics  . Smoking status: Former Smoker -- 0.5 packs/day for 8 years    Quit date: 03/29/1971  . Smokeless tobacco: Not on file  . Alcohol Use: No    OB History    Grav Para Term Preterm Abortions TAB SAB Ect Mult Living            Review of Systems All other systems negative except as documented in the HPI. All pertinent positives and negatives as reviewed in the HPI.  Allergies  Review of patient's allergies indicates no known allergies.  Home Medications   Current Outpatient Rx  Name Route Sig Dispense Refill  . ACETAMINOPHEN 500 MG PO TABS Oral Take 500 mg by mouth every 6 (six) hours as needed. Pain     . VITAMIN D 1000 UNITS PO TABS Oral Take 2,000 Units by mouth daily.      Marland Kitchen ESTROGENS CONJUGATED 0.3 MG PO TABS Oral Take 0.3 mg by mouth 2 (two) times daily. Take daily for 21 days then do not take for 7 days.     Marland Kitchen LORAZEPAM 0.5 MG PO TABS Oral Take 0.5 mg by mouth 2 (two) times daily. Anxiety     . METOPROLOL SUCCINATE ER 50 MG PO TB24 Oral Take 50 mg by mouth every morning.     . OXYBUTYNIN CHLORIDE ER 10 MG PO TB24 Oral Take 10 mg by mouth daily as needed. uncontrolled urination    . PREDNISONE 5 MG PO TABS Oral Take 5 mg by mouth every morning.     Marland Kitchen  ZOLPIDEM TARTRATE 10 MG PO TABS Oral Take 10 mg by mouth at bedtime.     . DULOXETINE HCL 30 MG PO CPEP Oral Take 30 mg by mouth every evening.       BP 158/83  Pulse 111  Temp(Src) 97.9 F (36.6 C) (Oral)  Resp 19  SpO2 98%  Physical Exam  Constitutional: She is oriented to person, place, and time. She appears well-developed and well-nourished. No distress.  HENT:  Head: Normocephalic and atraumatic.  Mouth/Throat: Oropharynx is clear and moist.  Eyes: Pupils are equal, round, and reactive to light.  Neck: Normal range of motion. Neck supple.  Cardiovascular: Normal rate, regular rhythm and normal heart sounds.  Exam reveals no gallop and no friction rub.   No murmur heard. Pulmonary/Chest: Effort normal and breath sounds normal. No respiratory distress.  Abdominal: Soft. Normal appearance and bowel sounds are normal.    Neurological: She is alert and oriented to person, place, and time.  Skin: Skin is warm and dry. No rash noted.     ED Course  Procedures (including critical care time)  Labs Reviewed  CBC - Abnormal; Notable for the following:    WBC 11.4 (*)    Hemoglobin 11.3 (*)    HCT 34.3 (*)    RDW 18.1 (*)    Platelets 558 (*)    All other components within normal limits  DIFFERENTIAL - Abnormal; Notable for the following:    Neutro Abs 8.5 (*)    Monocytes Absolute 1.1 (*)    All other components within normal limits  COMPREHENSIVE METABOLIC PANEL - Abnormal; Notable for the following:    Sodium 130 (*)    Chloride 91 (*)    Glucose, Bld 104 (*)    GFR calc non Af Amer 87 (*)    All other components within normal limits  URINALYSIS, ROUTINE W REFLEX MICROSCOPIC - Abnormal; Notable for the following:    APPearance CLOUDY (*)    Hgb urine dipstick MODERATE (*)    Ketones, ur 15 (*)    Nitrite POSITIVE (*)    Leukocytes, UA MODERATE (*)    All other components within normal limits  URINE MICROSCOPIC-ADD ON - Abnormal; Notable for the following:    Squamous Epithelial / LPF FEW (*)    Bacteria, UA MANY (*)    All other components within normal limits  POCT I-STAT TROPONIN I   Dg Chest 2 View  10/21/2011  *RADIOLOGY REPORT*  Clinical Data: Cough and SOB  CHEST - 2 VIEW  Comparison: 06/09/2011  Findings: Heart size appears normal.  No pleural effusion or edema. Calcified granuloma identified within the right midlung.  No airspace consolidation identified.  IMPRESSION:  1.  Right midlung granuloma.  Original Report Authenticated By: Rosealee Albee, M.D.   US Abdomen Complete  10/21/2011  *RADIOLOGY REPORT*  Clinical Data:  Right upper quadrant abdominal pain and vomiting.  COMPLETE ABDOMINAL ULTRASOUND  Comparison:  None.  Findings:  Gallbladder:  No gallstones, gallbladder wall thickening, or pericholecystic fluid.  Common bile duct:  Normal in caliber, measuring 4.7 mm in diameter proximally.  Liver:  1.0 cm cyst in the left lobe.  IVC:  Appears normal.  Pancreas:  No focal abnormality seen.   Spleen:  Normal, measuring 4.6 cm in length.  Right Kidney:  Normal, measuring 11.6 cm in length.  Left Kidney:  Normal, measuring 10.9 cm in length.  Abdominal aorta:  No aneurysm identified.  IMPRESSION: No significant abnormality.  Original Report Authenticated By: Darrol Angel, M.D.    Patient is asked to follow up with her primary care doctor for further evaluation.  Patient's main issue seems to be some anxiety while here in the emergency department and that seemed to cause her nausea to increase.  Patient has been stable and vital signs remained stable while here in the ER.  She is asked to increase her fluid intake and return here as needed for any worsening in her condition   MDM  MDM Reviewed: nursing note and vitals Interpretation: labs, ultrasound and x-ray            Carlyle Dolly, PA-C 10/21/11 1440

## 2011-10-21 NOTE — Discharge Instructions (Signed)
Follow-up with your doctor for a recheck.  Return here as needed.  Increase your fluid intake. °

## 2011-10-21 NOTE — ED Provider Notes (Signed)
Medical screening examination/treatment/procedure(s) were conducted as a shared visit with non-physician practitioner(s) and myself.  I personally evaluated the patient during the encounter  Pt seen and evaluated, pt with symptoms of nausea/vomiting, anxiety- does have UTI- pt will be discharged with antibiotics and antiemetics.    Ethelda Chick, MD 10/21/11 [phone]

## 2011-10-27 DIAGNOSIS — F339 Major depressive disorder, recurrent, unspecified: Secondary | ICD-10-CM | POA: Diagnosis not present

## 2011-10-27 DIAGNOSIS — G479 Sleep disorder, unspecified: Secondary | ICD-10-CM | POA: Diagnosis not present

## 2011-10-27 DIAGNOSIS — I10 Essential (primary) hypertension: Secondary | ICD-10-CM | POA: Diagnosis not present

## 2011-12-09 DIAGNOSIS — H04129 Dry eye syndrome of unspecified lacrimal gland: Secondary | ICD-10-CM | POA: Diagnosis not present

## 2011-12-09 DIAGNOSIS — H2510 Age-related nuclear cataract, unspecified eye: Secondary | ICD-10-CM | POA: Diagnosis not present

## 2011-12-09 DIAGNOSIS — H40029 Open angle with borderline findings, high risk, unspecified eye: Secondary | ICD-10-CM | POA: Diagnosis not present

## 2012-01-06 DIAGNOSIS — M810 Age-related osteoporosis without current pathological fracture: Secondary | ICD-10-CM | POA: Diagnosis not present

## 2012-01-06 DIAGNOSIS — M353 Polymyalgia rheumatica: Secondary | ICD-10-CM | POA: Diagnosis not present

## 2012-01-06 DIAGNOSIS — M159 Polyosteoarthritis, unspecified: Secondary | ICD-10-CM | POA: Diagnosis not present

## 2012-01-20 DIAGNOSIS — G479 Sleep disorder, unspecified: Secondary | ICD-10-CM | POA: Diagnosis not present

## 2012-01-20 DIAGNOSIS — I10 Essential (primary) hypertension: Secondary | ICD-10-CM | POA: Diagnosis not present

## 2012-01-20 DIAGNOSIS — F339 Major depressive disorder, recurrent, unspecified: Secondary | ICD-10-CM | POA: Diagnosis not present

## 2012-01-27 DIAGNOSIS — M1710 Unilateral primary osteoarthritis, unspecified knee: Secondary | ICD-10-CM | POA: Diagnosis not present

## 2012-02-08 ENCOUNTER — Other Ambulatory Visit: Payer: Self-pay | Admitting: Geriatric Medicine

## 2012-02-08 DIAGNOSIS — Z1231 Encounter for screening mammogram for malignant neoplasm of breast: Secondary | ICD-10-CM

## 2012-02-23 DIAGNOSIS — N3020 Other chronic cystitis without hematuria: Secondary | ICD-10-CM | POA: Diagnosis not present

## 2012-02-27 DIAGNOSIS — Z23 Encounter for immunization: Secondary | ICD-10-CM | POA: Diagnosis not present

## 2012-03-13 DIAGNOSIS — N3000 Acute cystitis without hematuria: Secondary | ICD-10-CM | POA: Diagnosis not present

## 2012-03-13 DIAGNOSIS — N3020 Other chronic cystitis without hematuria: Secondary | ICD-10-CM | POA: Diagnosis not present

## 2012-03-15 ENCOUNTER — Ambulatory Visit
Admission: RE | Admit: 2012-03-15 | Discharge: 2012-03-15 | Disposition: A | Discharge status: Home / Self Care | Payer: Medicare Other | Source: Ambulatory Visit | Attending: Geriatric Medicine | Admitting: Geriatric Medicine

## 2012-03-15 DIAGNOSIS — Z1231 Encounter for screening mammogram for malignant neoplasm of breast: Secondary | ICD-10-CM | POA: Diagnosis not present

## 2012-04-18 DIAGNOSIS — M353 Polymyalgia rheumatica: Secondary | ICD-10-CM | POA: Diagnosis not present

## 2012-04-18 DIAGNOSIS — G479 Sleep disorder, unspecified: Secondary | ICD-10-CM | POA: Diagnosis not present

## 2012-04-18 DIAGNOSIS — I10 Essential (primary) hypertension: Secondary | ICD-10-CM | POA: Diagnosis not present

## 2012-05-22 DIAGNOSIS — L821 Other seborrheic keratosis: Secondary | ICD-10-CM | POA: Diagnosis not present

## 2012-08-17 DIAGNOSIS — Z0000 Encounter for general adult medical examination without abnormal findings: Secondary | ICD-10-CM | POA: Diagnosis not present

## 2012-08-17 DIAGNOSIS — Z1331 Encounter for screening for depression: Secondary | ICD-10-CM | POA: Diagnosis not present

## 2012-08-17 DIAGNOSIS — Z79899 Other long term (current) drug therapy: Secondary | ICD-10-CM | POA: Diagnosis not present

## 2012-08-17 DIAGNOSIS — I10 Essential (primary) hypertension: Secondary | ICD-10-CM | POA: Diagnosis not present

## 2012-08-17 DIAGNOSIS — E7800 Pure hypercholesterolemia, unspecified: Secondary | ICD-10-CM | POA: Diagnosis not present

## 2012-08-21 DIAGNOSIS — M810 Age-related osteoporosis without current pathological fracture: Secondary | ICD-10-CM | POA: Diagnosis not present

## 2012-08-21 DIAGNOSIS — M353 Polymyalgia rheumatica: Secondary | ICD-10-CM | POA: Diagnosis not present

## 2012-08-21 DIAGNOSIS — M159 Polyosteoarthritis, unspecified: Secondary | ICD-10-CM | POA: Diagnosis not present

## 2012-08-30 DIAGNOSIS — J309 Allergic rhinitis, unspecified: Secondary | ICD-10-CM | POA: Diagnosis not present

## 2012-08-30 DIAGNOSIS — I10 Essential (primary) hypertension: Secondary | ICD-10-CM | POA: Diagnosis not present

## 2012-08-31 DIAGNOSIS — I10 Essential (primary) hypertension: Secondary | ICD-10-CM | POA: Diagnosis not present

## 2012-08-31 DIAGNOSIS — E7800 Pure hypercholesterolemia, unspecified: Secondary | ICD-10-CM | POA: Diagnosis not present

## 2012-08-31 DIAGNOSIS — Z79899 Other long term (current) drug therapy: Secondary | ICD-10-CM | POA: Diagnosis not present

## 2012-10-20 DIAGNOSIS — R319 Hematuria, unspecified: Secondary | ICD-10-CM | POA: Diagnosis not present

## 2012-10-20 DIAGNOSIS — Z0000 Encounter for general adult medical examination without abnormal findings: Secondary | ICD-10-CM | POA: Diagnosis not present

## 2012-10-20 DIAGNOSIS — Z01419 Encounter for gynecological examination (general) (routine) without abnormal findings: Secondary | ICD-10-CM | POA: Diagnosis not present

## 2012-10-25 DIAGNOSIS — N390 Urinary tract infection, site not specified: Secondary | ICD-10-CM | POA: Diagnosis not present

## 2012-10-25 DIAGNOSIS — N3020 Other chronic cystitis without hematuria: Secondary | ICD-10-CM | POA: Diagnosis not present

## 2012-10-25 DIAGNOSIS — N952 Postmenopausal atrophic vaginitis: Secondary | ICD-10-CM | POA: Diagnosis not present

## 2012-12-08 DIAGNOSIS — H04129 Dry eye syndrome of unspecified lacrimal gland: Secondary | ICD-10-CM | POA: Diagnosis not present

## 2012-12-08 DIAGNOSIS — H2510 Age-related nuclear cataract, unspecified eye: Secondary | ICD-10-CM | POA: Diagnosis not present

## 2012-12-08 DIAGNOSIS — H40019 Open angle with borderline findings, low risk, unspecified eye: Secondary | ICD-10-CM | POA: Diagnosis not present

## 2013-01-01 ENCOUNTER — Other Ambulatory Visit: Payer: Self-pay

## 2013-01-01 DIAGNOSIS — Z1231 Encounter for screening mammogram for malignant neoplasm of breast: Secondary | ICD-10-CM

## 2013-01-11 ENCOUNTER — Other Ambulatory Visit: Payer: Self-pay | Admitting: Gastroenterology

## 2013-01-12 ENCOUNTER — Encounter (HOSPITAL_COMMUNITY): Payer: Self-pay | Admitting: *Deleted

## 2013-01-15 ENCOUNTER — Encounter (HOSPITAL_COMMUNITY): Payer: Self-pay | Admitting: Pharmacy Technician

## 2013-01-16 ENCOUNTER — Encounter (HOSPITAL_COMMUNITY)
Admission: RE | Admit: 2013-01-16 | Discharge: 2013-01-16 | Disposition: A | Discharge status: Home / Self Care | Payer: Medicare Other | Source: Ambulatory Visit | Attending: Gastroenterology | Admitting: Gastroenterology

## 2013-01-16 DIAGNOSIS — Z01818 Encounter for other preprocedural examination: Secondary | ICD-10-CM | POA: Insufficient documentation

## 2013-01-16 DIAGNOSIS — D509 Iron deficiency anemia, unspecified: Secondary | ICD-10-CM | POA: Insufficient documentation

## 2013-01-16 DIAGNOSIS — Z01812 Encounter for preprocedural laboratory examination: Secondary | ICD-10-CM | POA: Insufficient documentation

## 2013-01-16 LAB — HEMOGLOBIN AND HEMATOCRIT, BLOOD: Hemoglobin: 11.8 g/dL — ABNORMAL LOW (ref 12.0–15.0)

## 2013-01-29 NOTE — Anesthesia Preprocedure Evaluation (Addendum)
Anesthesia Evaluation  Patient identified by MRN, date of birth, ID band Patient awake    Reviewed: Allergy & Precautions, H&P , NPO status , Patient's Chart, lab work & pertinent test results  Airway Mallampati: II TM Distance: <3 FB Neck ROM: Full    Dental no notable dental hx.    Pulmonary neg pulmonary ROS,  breath sounds clear to auscultation  Pulmonary exam normal       Cardiovascular hypertension, Pt. on medications and Pt. on home beta blockers Rhythm:Regular Rate:Normal     Neuro/Psych negative neurological ROS  negative psych ROS   GI/Hepatic negative GI ROS, Neg liver ROS,   Endo/Other  negative endocrine ROS  Renal/GU negative Renal ROS  negative genitourinary   Musculoskeletal negative musculoskeletal ROS (+)   Abdominal   Peds negative pediatric ROS (+)  Hematology negative hematology ROS (+)   Anesthesia Other Findings   Reproductive/Obstetrics negative OB ROS                        Anesthesia Physical Anesthesia Plan  ASA: II  Anesthesia Plan: MAC   Post-op Pain Management:    Induction: Intravenous  Airway Management Planned: Simple Face Mask  Additional Equipment:   Intra-op Plan:   Post-operative Plan:   Informed Consent: I have reviewed the patients History and Physical, chart, labs and discussed the procedure including the risks, benefits and alternatives for the proposed anesthesia with the patient or authorized representative who has indicated his/her understanding and acceptance.     Plan Discussed with: CRNA and Surgeon  Anesthesia Plan Comments:         Anesthesia Quick Evaluation

## 2013-01-30 ENCOUNTER — Encounter (HOSPITAL_COMMUNITY): Payer: Self-pay | Admitting: Anesthesiology

## 2013-01-30 ENCOUNTER — Ambulatory Visit (HOSPITAL_COMMUNITY)
Admission: RE | Admit: 2013-01-30 | Discharge: 2013-01-30 | Disposition: A | Discharge status: Home / Self Care | Payer: Medicare Other | Source: Ambulatory Visit | Attending: Gastroenterology | Admitting: Gastroenterology

## 2013-01-30 ENCOUNTER — Ambulatory Visit (HOSPITAL_COMMUNITY): Payer: Medicare Other | Admitting: Anesthesiology

## 2013-01-30 ENCOUNTER — Encounter (HOSPITAL_COMMUNITY)
Admission: RE | Disposition: A | Discharge status: Home / Self Care | Payer: Self-pay | Source: Ambulatory Visit | Attending: Gastroenterology

## 2013-01-30 ENCOUNTER — Encounter (HOSPITAL_COMMUNITY): Payer: Self-pay

## 2013-01-30 DIAGNOSIS — I10 Essential (primary) hypertension: Secondary | ICD-10-CM | POA: Insufficient documentation

## 2013-01-30 DIAGNOSIS — Z96659 Presence of unspecified artificial knee joint: Secondary | ICD-10-CM | POA: Diagnosis not present

## 2013-01-30 DIAGNOSIS — M353 Polymyalgia rheumatica: Secondary | ICD-10-CM | POA: Diagnosis not present

## 2013-01-30 DIAGNOSIS — K449 Diaphragmatic hernia without obstruction or gangrene: Secondary | ICD-10-CM | POA: Insufficient documentation

## 2013-01-30 DIAGNOSIS — M810 Age-related osteoporosis without current pathological fracture: Secondary | ICD-10-CM | POA: Insufficient documentation

## 2013-01-30 DIAGNOSIS — R195 Other fecal abnormalities: Secondary | ICD-10-CM | POA: Insufficient documentation

## 2013-01-30 DIAGNOSIS — K225 Diverticulum of esophagus, acquired: Secondary | ICD-10-CM | POA: Diagnosis not present

## 2013-01-30 DIAGNOSIS — IMO0001 Reserved for inherently not codable concepts without codable children: Secondary | ICD-10-CM | POA: Diagnosis not present

## 2013-01-30 DIAGNOSIS — E7800 Pure hypercholesterolemia, unspecified: Secondary | ICD-10-CM | POA: Diagnosis not present

## 2013-01-30 DIAGNOSIS — D509 Iron deficiency anemia, unspecified: Secondary | ICD-10-CM | POA: Diagnosis not present

## 2013-01-30 DIAGNOSIS — K6289 Other specified diseases of anus and rectum: Secondary | ICD-10-CM | POA: Diagnosis not present

## 2013-01-30 DIAGNOSIS — D649 Anemia, unspecified: Secondary | ICD-10-CM | POA: Diagnosis not present

## 2013-01-30 DIAGNOSIS — Z86010 Personal history of colonic polyps: Secondary | ICD-10-CM | POA: Diagnosis not present

## 2013-01-30 HISTORY — PX: ESOPHAGOGASTRODUODENOSCOPY (EGD) WITH PROPOFOL: SHX5813

## 2013-01-30 HISTORY — PX: COLONOSCOPY WITH PROPOFOL: SHX5780

## 2013-01-30 SURGERY — COLONOSCOPY WITH PROPOFOL
Anesthesia: Monitor Anesthesia Care

## 2013-01-30 MED ORDER — PROMETHAZINE HCL 25 MG/ML IJ SOLN: 6.2500 mg | INTRAMUSCULAR | Status: DC | PRN

## 2013-01-30 MED ORDER — LACTATED RINGERS IV SOLN
INTRAVENOUS | Status: DC
  Administered 2013-01-30: 1000 mL via INTRAVENOUS

## 2013-01-30 MED ORDER — LACTATED RINGERS IV SOLN
INTRAVENOUS | Status: DC | PRN
  Administered 2013-01-30: 08:00:00 via INTRAVENOUS

## 2013-01-30 MED ORDER — SODIUM CHLORIDE 0.9 % IV SOLN: INTRAVENOUS | Status: DC

## 2013-01-30 MED ORDER — KETAMINE HCL 50 MG/ML IJ SOLN
INTRAMUSCULAR | Status: DC | PRN
  Administered 2013-01-30: 25 mg via INTRAMUSCULAR

## 2013-01-30 MED ORDER — PROPOFOL 10 MG/ML IV BOLUS
INTRAVENOUS | Status: DC | PRN
  Administered 2013-01-30: 50 mg via INTRAVENOUS
  Administered 2013-01-30 (×4): 25 mg via INTRAVENOUS

## 2013-01-30 SURGICAL SUPPLY — 24 items

## 2013-01-30 NOTE — Op Note (Signed)
Problem: Unexplained normocytic anemia  Endoscopist: Danise Edge  Premedication: Propofol administered by anesthesia  Procedure: Diagnostic esophagogastroduodenoscopy The patient was placed in the left lateral decubitus position. The Pentax gastroscope was passed through the posterior hypopharynx into the proximal esophagus without difficulty. 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 The larynx and vocal cords appeared normal .  Esophagoscopy: The proximal, mid, and lower segments of the esophageal mucosa appeared normal. The squamocolumnar junction is regular and noted at 35 cm from the incisor teeth.  Gastroscopy: There is a moderate-sized hiatal hernia. Retroflexed view of the gastric cardia and fundus was normal. The gastric body, antrum, and pylorus appeared normal.  Duodenoscopy: The duodenal bulb and descending duodenum appeared normal.  Assessment: Normal esophagogastroduodenoscopy. Small Zenker's diverticulum.  Procedure: Diagnostic colonoscopy. Anal inspection and digital rectal exam were normal. The Pentax pediatric colonoscope was introduced into the rectum and easily advanced to the cecum. A normal-appearing ileocecal valve and appendiceal orifice were identified. The ileocecal valve was intubated and the terminal ileum inspected. Colonic preparation for the exam today was good.  Rectum. Normal. Retroflex view of the distal rectum reveals a hypertrophied anal papilla.  Sigmoid colon and descending colon. Normal.  Splenic flexure. Normal.  Transverse colon. Normal.  Hepatic flexure. Normal.  Ascending colon. Normal.  Cecum and ileocecal valve. Normal.  Terminal ileum. Normal.  Assessment: Normal diagnostic proctocolonoscopy to the cecum with inspection of the terminal ileum. Hypertrophied anal papilla.

## 2013-01-30 NOTE — H&P (Signed)
  Problem: Normocytic anemia  History: The patient is a 72 year old female born 1941/05/04. In October 2001, the patient underwent a colonoscopy with removal of an inflammatory ileal polyp. In July 2009, the patient underwent a normal screening colonoscopy.  The patient has unexplained normocytic anemia. She feels well. Occasionally she passes dark stool and intermittently takes oral iron.  The patient is scheduled to undergo a diagnostic esophagogastroduodenoscopy and colonoscopy to rule out gastrointestinal bleeding.  Past medical history: Hypertension. Osteoporosis. Polymyalgia rheumatica. Depression. Mild iron deficiency anemia diagnosed in 2011. Hypercholesterolemia. Degenerative joint disease of the knees. Tonsillectomy. Hysterectomy. Cystocele and rectocele repair. Total left knee replacement surgery.  Allergies: None. Hydrocodone causes nausea.  Exam: The patient is alert, ambulatory, and appears quite healthy. Abdomen is soft, flat, and nontender to palpation. Cardiac exam reveals a regular rhythm. Lungs are clear to auscultation.  Plan: Proceed with diagnostic esophagogastroduodenoscopy and colonoscopy to evaluate unexplained normocytic anemia.

## 2013-01-30 NOTE — Transfer of Care (Signed)
Immediate Anesthesia Transfer of Care Note  Patient: Eileen Jennings  Procedure(s) Performed: Procedure(s): COLONOSCOPY WITH PROPOFOL (N/A) ESOPHAGOGASTRODUODENOSCOPY (EGD) WITH PROPOFOL (N/A)  Patient Location: PACU  Anesthesia Type:MAC  Level of Consciousness: awake, sedated and patient cooperative  Airway & Oxygen Therapy: Patient Spontanous Breathing and Patient connected to face mask oxygen  Post-op Assessment: Report given to PACU RN and Post -op Vital signs reviewed and stable  Post vital signs: Reviewed and stable  Complications: No apparent anesthesia complications

## 2013-01-30 NOTE — Preoperative (Signed)
Beta Blockers   Reason not to administer Beta Blockers:Not Applicable 

## 2013-01-30 NOTE — Anesthesia Postprocedure Evaluation (Signed)
  Anesthesia Post-op Note  Patient: Eileen Jennings  Procedure(s) Performed: Procedure(s) (LRB): COLONOSCOPY WITH PROPOFOL (N/A) ESOPHAGOGASTRODUODENOSCOPY (EGD) WITH PROPOFOL (N/A)  Patient Location: PACU  Anesthesia Type: MAC  Level of Consciousness: awake and alert   Airway and Oxygen Therapy: Patient Spontanous Breathing  Post-op Pain: mild  Post-op Assessment: Post-op Vital signs reviewed, Patient's Cardiovascular Status Stable, Respiratory Function Stable, Patent Airway and No signs of Nausea or vomiting  Last Vitals:  Filed Vitals:   01/30/13 0847  BP: 143/73  Pulse:   Temp:   Resp: 9    Post-op Vital Signs: stable   Complications: No apparent anesthesia complications

## 2013-01-31 ENCOUNTER — Encounter (HOSPITAL_COMMUNITY): Payer: Self-pay | Admitting: Gastroenterology

## 2013-02-15 DIAGNOSIS — I10 Essential (primary) hypertension: Secondary | ICD-10-CM | POA: Diagnosis not present

## 2013-02-15 DIAGNOSIS — Z79899 Other long term (current) drug therapy: Secondary | ICD-10-CM | POA: Diagnosis not present

## 2013-02-20 DIAGNOSIS — M810 Age-related osteoporosis without current pathological fracture: Secondary | ICD-10-CM | POA: Diagnosis not present

## 2013-02-20 DIAGNOSIS — M159 Polyosteoarthritis, unspecified: Secondary | ICD-10-CM | POA: Diagnosis not present

## 2013-02-20 DIAGNOSIS — M353 Polymyalgia rheumatica: Secondary | ICD-10-CM | POA: Diagnosis not present

## 2013-02-26 DIAGNOSIS — N390 Urinary tract infection, site not specified: Secondary | ICD-10-CM | POA: Diagnosis not present

## 2013-02-26 DIAGNOSIS — N3020 Other chronic cystitis without hematuria: Secondary | ICD-10-CM | POA: Diagnosis not present

## 2013-03-07 DIAGNOSIS — Z23 Encounter for immunization: Secondary | ICD-10-CM | POA: Diagnosis not present

## 2013-03-14 DIAGNOSIS — Z96659 Presence of unspecified artificial knee joint: Secondary | ICD-10-CM | POA: Diagnosis not present

## 2013-03-16 ENCOUNTER — Ambulatory Visit
Admission: RE | Admit: 2013-03-16 | Discharge: 2013-03-16 | Disposition: A | Discharge status: Home / Self Care | Payer: Medicare Other | Source: Ambulatory Visit

## 2013-03-16 DIAGNOSIS — Z1231 Encounter for screening mammogram for malignant neoplasm of breast: Secondary | ICD-10-CM

## 2013-04-12 DIAGNOSIS — D649 Anemia, unspecified: Secondary | ICD-10-CM | POA: Diagnosis not present

## 2013-04-12 DIAGNOSIS — I10 Essential (primary) hypertension: Secondary | ICD-10-CM | POA: Diagnosis not present

## 2013-04-12 DIAGNOSIS — Z79899 Other long term (current) drug therapy: Secondary | ICD-10-CM | POA: Diagnosis not present

## 2013-05-10 DIAGNOSIS — I10 Essential (primary) hypertension: Secondary | ICD-10-CM | POA: Diagnosis not present

## 2013-05-10 DIAGNOSIS — Z79899 Other long term (current) drug therapy: Secondary | ICD-10-CM | POA: Diagnosis not present

## 2013-05-10 DIAGNOSIS — G479 Sleep disorder, unspecified: Secondary | ICD-10-CM | POA: Diagnosis not present

## 2013-05-25 DIAGNOSIS — L253 Unspecified contact dermatitis due to other chemical products: Secondary | ICD-10-CM | POA: Diagnosis not present

## 2013-05-25 DIAGNOSIS — L659 Nonscarring hair loss, unspecified: Secondary | ICD-10-CM | POA: Diagnosis not present

## 2013-07-26 DIAGNOSIS — F411 Generalized anxiety disorder: Secondary | ICD-10-CM | POA: Diagnosis not present

## 2013-07-26 DIAGNOSIS — R1110 Vomiting, unspecified: Secondary | ICD-10-CM | POA: Diagnosis not present

## 2013-07-30 DIAGNOSIS — F329 Major depressive disorder, single episode, unspecified: Secondary | ICD-10-CM | POA: Diagnosis not present

## 2013-07-30 DIAGNOSIS — G4700 Insomnia, unspecified: Secondary | ICD-10-CM | POA: Diagnosis not present

## 2013-07-31 ENCOUNTER — Emergency Department (HOSPITAL_COMMUNITY): Payer: Medicare Other

## 2013-07-31 ENCOUNTER — Inpatient Hospital Stay (HOSPITAL_COMMUNITY)
Admission: EM | Admit: 2013-07-31 | Discharge: 2013-08-03 | DRG: 689 | Disposition: A | Discharge status: Home / Self Care | Payer: Medicare Other | Attending: Internal Medicine | Admitting: Internal Medicine

## 2013-07-31 ENCOUNTER — Encounter (HOSPITAL_COMMUNITY): Payer: Self-pay | Admitting: Emergency Medicine

## 2013-07-31 DIAGNOSIS — T50901A Poisoning by unspecified drugs, medicaments and biological substances, accidental (unintentional), initial encounter: Secondary | ICD-10-CM

## 2013-07-31 DIAGNOSIS — M353 Polymyalgia rheumatica: Secondary | ICD-10-CM

## 2013-07-31 DIAGNOSIS — E876 Hypokalemia: Secondary | ICD-10-CM | POA: Diagnosis present

## 2013-07-31 DIAGNOSIS — M1990 Unspecified osteoarthritis, unspecified site: Secondary | ICD-10-CM | POA: Diagnosis present

## 2013-07-31 DIAGNOSIS — G92 Toxic encephalopathy: Secondary | ICD-10-CM | POA: Diagnosis present

## 2013-07-31 DIAGNOSIS — T398X1A Poisoning by other nonopioid analgesics and antipyretics, not elsewhere classified, accidental (unintentional), initial encounter: Secondary | ICD-10-CM | POA: Diagnosis not present

## 2013-07-31 DIAGNOSIS — F329 Major depressive disorder, single episode, unspecified: Secondary | ICD-10-CM | POA: Diagnosis present

## 2013-07-31 DIAGNOSIS — I10 Essential (primary) hypertension: Secondary | ICD-10-CM

## 2013-07-31 DIAGNOSIS — F411 Generalized anxiety disorder: Secondary | ICD-10-CM | POA: Diagnosis present

## 2013-07-31 DIAGNOSIS — Z87891 Personal history of nicotine dependence: Secondary | ICD-10-CM

## 2013-07-31 DIAGNOSIS — E2749 Other adrenocortical insufficiency: Secondary | ICD-10-CM | POA: Diagnosis present

## 2013-07-31 DIAGNOSIS — G929 Unspecified toxic encephalopathy: Secondary | ICD-10-CM | POA: Diagnosis present

## 2013-07-31 DIAGNOSIS — Z96659 Presence of unspecified artificial knee joint: Secondary | ICD-10-CM

## 2013-07-31 DIAGNOSIS — Z79899 Other long term (current) drug therapy: Secondary | ICD-10-CM

## 2013-07-31 DIAGNOSIS — T4271XA Poisoning by unspecified antiepileptic and sedative-hypnotic drugs, accidental (unintentional), initial encounter: Secondary | ICD-10-CM

## 2013-07-31 DIAGNOSIS — M179 Osteoarthritis of knee, unspecified: Secondary | ICD-10-CM

## 2013-07-31 DIAGNOSIS — R259 Unspecified abnormal involuntary movements: Secondary | ICD-10-CM | POA: Diagnosis present

## 2013-07-31 DIAGNOSIS — G9340 Encephalopathy, unspecified: Secondary | ICD-10-CM

## 2013-07-31 DIAGNOSIS — IMO0002 Reserved for concepts with insufficient information to code with codable children: Secondary | ICD-10-CM | POA: Diagnosis not present

## 2013-07-31 DIAGNOSIS — N10 Acute tubulo-interstitial nephritis: Principal | ICD-10-CM | POA: Diagnosis present

## 2013-07-31 DIAGNOSIS — T400X1A Poisoning by opium, accidental (unintentional), initial encounter: Secondary | ICD-10-CM | POA: Diagnosis present

## 2013-07-31 DIAGNOSIS — E871 Hypo-osmolality and hyponatremia: Secondary | ICD-10-CM

## 2013-07-31 DIAGNOSIS — N390 Urinary tract infection, site not specified: Secondary | ICD-10-CM

## 2013-07-31 DIAGNOSIS — D72829 Elevated white blood cell count, unspecified: Secondary | ICD-10-CM | POA: Diagnosis present

## 2013-07-31 DIAGNOSIS — F3289 Other specified depressive episodes: Secondary | ICD-10-CM | POA: Diagnosis present

## 2013-07-31 DIAGNOSIS — G479 Sleep disorder, unspecified: Secondary | ICD-10-CM | POA: Diagnosis present

## 2013-07-31 DIAGNOSIS — G8929 Other chronic pain: Secondary | ICD-10-CM | POA: Diagnosis present

## 2013-07-31 DIAGNOSIS — D649 Anemia, unspecified: Secondary | ICD-10-CM | POA: Diagnosis present

## 2013-07-31 DIAGNOSIS — Z885 Allergy status to narcotic agent status: Secondary | ICD-10-CM | POA: Diagnosis not present

## 2013-07-31 DIAGNOSIS — N200 Calculus of kidney: Secondary | ICD-10-CM | POA: Diagnosis not present

## 2013-07-31 DIAGNOSIS — M1710 Unilateral primary osteoarthritis, unspecified knee: Secondary | ICD-10-CM

## 2013-07-31 DIAGNOSIS — F32A Depression, unspecified: Secondary | ICD-10-CM

## 2013-07-31 DIAGNOSIS — IMO0001 Reserved for inherently not codable concepts without codable children: Secondary | ICD-10-CM | POA: Diagnosis present

## 2013-07-31 DIAGNOSIS — T426X1A Poisoning by other antiepileptic and sedative-hypnotic drugs, accidental (unintentional), initial encounter: Secondary | ICD-10-CM | POA: Diagnosis not present

## 2013-07-31 DIAGNOSIS — F29 Unspecified psychosis not due to a substance or known physiological condition: Secondary | ICD-10-CM | POA: Diagnosis not present

## 2013-07-31 DIAGNOSIS — R4182 Altered mental status, unspecified: Secondary | ICD-10-CM | POA: Diagnosis not present

## 2013-07-31 LAB — URINALYSIS, ROUTINE W REFLEX MICROSCOPIC
Bilirubin Urine: NEGATIVE
GLUCOSE, UA: NEGATIVE mg/dL
Ketones, ur: NEGATIVE mg/dL
Nitrite: NEGATIVE
PH: 6 (ref 5.0–8.0)
PROTEIN: NEGATIVE mg/dL
Specific Gravity, Urine: 1.009 (ref 1.005–1.030)
Urobilinogen, UA: 0.2 mg/dL (ref 0.0–1.0)

## 2013-07-31 LAB — I-STAT CHEM 8, ED
BUN: 7 mg/dL (ref 6–23)
CREATININE: 0.8 mg/dL (ref 0.50–1.10)
Calcium, Ion: 1.01 mmol/L — ABNORMAL LOW (ref 1.13–1.30)
Chloride: 93 mEq/L — ABNORMAL LOW (ref 96–112)
Glucose, Bld: 115 mg/dL — ABNORMAL HIGH (ref 70–99)
HCT: 41 % (ref 36.0–46.0)
Hemoglobin: 13.9 g/dL (ref 12.0–15.0)
Potassium: 5.5 mEq/L — ABNORMAL HIGH (ref 3.7–5.3)
SODIUM: 126 meq/L — AB (ref 137–147)
TCO2: 25 mmol/L (ref 0–100)

## 2013-07-31 LAB — CBC
HCT: 35.5 % — ABNORMAL LOW (ref 36.0–46.0)
HEMOGLOBIN: 12.2 g/dL (ref 12.0–15.0)
MCH: 33 pg (ref 26.0–34.0)
MCHC: 34.4 g/dL (ref 30.0–36.0)
MCV: 95.9 fL (ref 78.0–100.0)
Platelets: 478 10*3/uL — ABNORMAL HIGH (ref 150–400)
RBC: 3.7 MIL/uL — AB (ref 3.87–5.11)
RDW: 14.4 % (ref 11.5–15.5)
WBC: 14.5 10*3/uL — ABNORMAL HIGH (ref 4.0–10.5)

## 2013-07-31 LAB — URINE MICROSCOPIC-ADD ON

## 2013-07-31 LAB — I-STAT CG4 LACTIC ACID, ED: LACTIC ACID, VENOUS: 2.19 mmol/L (ref 0.5–2.2)

## 2013-07-31 MED ORDER — SODIUM CHLORIDE 0.9 % IV SOLN
INTRAVENOUS | Status: DC
  Administered 2013-07-31: via INTRAVENOUS

## 2013-07-31 MED ORDER — ONDANSETRON HCL 4 MG/2ML IJ SOLN
4.0000 mg | Freq: Once | INTRAMUSCULAR | Status: AC
  Administered 2013-07-31: 4 mg via INTRAVENOUS
  Filled 2013-07-31: qty 2

## 2013-07-31 NOTE — ED Notes (Signed)
Pt had 60 pills of tramadol filled on 07/10/13 and she now has 14 left. Had 60, .05mg  ativan filled on 07/26/13 and now has 46 left. Had 30, 10mg  ambien filled on 07/26/13 and has 5 left in bottle now. 12 tablets of 4mg  zofran filled on 07/26/13. 6 now left. Pt had multiple other medications that did not seem to have a significant amounts missing. Daughter has detailed list.
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
## 2013-08-01 ENCOUNTER — Encounter (HOSPITAL_COMMUNITY): Payer: Self-pay | Admitting: Internal Medicine

## 2013-08-01 ENCOUNTER — Inpatient Hospital Stay (HOSPITAL_COMMUNITY): Payer: Medicare Other

## 2013-08-01 DIAGNOSIS — F329 Major depressive disorder, single episode, unspecified: Secondary | ICD-10-CM | POA: Diagnosis present

## 2013-08-01 DIAGNOSIS — R4182 Altered mental status, unspecified: Secondary | ICD-10-CM | POA: Diagnosis not present

## 2013-08-01 DIAGNOSIS — F32A Depression, unspecified: Secondary | ICD-10-CM | POA: Diagnosis present

## 2013-08-01 DIAGNOSIS — T4271XA Poisoning by unspecified antiepileptic and sedative-hypnotic drugs, accidental (unintentional), initial encounter: Secondary | ICD-10-CM

## 2013-08-01 DIAGNOSIS — G9340 Encephalopathy, unspecified: Secondary | ICD-10-CM | POA: Diagnosis present

## 2013-08-01 DIAGNOSIS — T3991XA Poisoning by unspecified nonopioid analgesic, antipyretic and antirheumatic, accidental (unintentional), initial encounter: Secondary | ICD-10-CM

## 2013-08-01 DIAGNOSIS — N200 Calculus of kidney: Secondary | ICD-10-CM | POA: Diagnosis not present

## 2013-08-01 DIAGNOSIS — T398X1A Poisoning by other nonopioid analgesics and antipyretics, not elsewhere classified, accidental (unintentional), initial encounter: Secondary | ICD-10-CM

## 2013-08-01 DIAGNOSIS — N390 Urinary tract infection, site not specified: Secondary | ICD-10-CM | POA: Diagnosis not present

## 2013-08-01 DIAGNOSIS — T426X1A Poisoning by other antiepileptic and sedative-hypnotic drugs, accidental (unintentional), initial encounter: Secondary | ICD-10-CM

## 2013-08-01 DIAGNOSIS — T50901A Poisoning by unspecified drugs, medicaments and biological substances, accidental (unintentional), initial encounter: Secondary | ICD-10-CM | POA: Diagnosis not present

## 2013-08-01 DIAGNOSIS — E871 Hypo-osmolality and hyponatremia: Secondary | ICD-10-CM | POA: Diagnosis not present

## 2013-08-01 DIAGNOSIS — M353 Polymyalgia rheumatica: Secondary | ICD-10-CM | POA: Diagnosis present

## 2013-08-01 DIAGNOSIS — D72829 Elevated white blood cell count, unspecified: Secondary | ICD-10-CM | POA: Diagnosis not present

## 2013-08-01 DIAGNOSIS — I10 Essential (primary) hypertension: Secondary | ICD-10-CM | POA: Diagnosis present

## 2013-08-01 HISTORY — DX: Urinary tract infection, site not specified: N39.0

## 2013-08-01 HISTORY — DX: Poisoning by unspecified drugs, medicaments and biological substances, accidental (unintentional), initial encounter: T50.901A

## 2013-08-01 LAB — HEPATIC FUNCTION PANEL
ALT: 11 U/L (ref 0–35)
AST: 19 U/L (ref 0–37)
Albumin: 3.7 g/dL (ref 3.5–5.2)
Alkaline Phosphatase: 38 U/L — ABNORMAL LOW (ref 39–117)
TOTAL PROTEIN: 7.4 g/dL (ref 6.0–8.3)
Total Bilirubin: 0.4 mg/dL (ref 0.3–1.2)

## 2013-08-01 LAB — CBC WITH DIFFERENTIAL/PLATELET
Basophils Absolute: 0 10*3/uL (ref 0.0–0.1)
Basophils Relative: 0 % (ref 0–1)
Eosinophils Absolute: 0 10*3/uL (ref 0.0–0.7)
Eosinophils Relative: 0 % (ref 0–5)
HEMATOCRIT: 34.7 % — AB (ref 36.0–46.0)
HEMOGLOBIN: 11.7 g/dL — AB (ref 12.0–15.0)
LYMPHS PCT: 7 % — AB (ref 12–46)
Lymphs Abs: 1 10*3/uL (ref 0.7–4.0)
MCH: 32.4 pg (ref 26.0–34.0)
MCHC: 33.7 g/dL (ref 30.0–36.0)
MCV: 96.1 fL (ref 78.0–100.0)
MONOS PCT: 6 % (ref 3–12)
Monocytes Absolute: 0.8 10*3/uL (ref 0.1–1.0)
Neutro Abs: 11.5 10*3/uL — ABNORMAL HIGH (ref 1.7–7.7)
Neutrophils Relative %: 87 % — ABNORMAL HIGH (ref 43–77)
Platelets: 451 10*3/uL — ABNORMAL HIGH (ref 150–400)
RBC: 3.61 MIL/uL — ABNORMAL LOW (ref 3.87–5.11)
RDW: 14.3 % (ref 11.5–15.5)
WBC: 13.2 10*3/uL — AB (ref 4.0–10.5)

## 2013-08-01 LAB — BASIC METABOLIC PANEL
BUN: 4 mg/dL — ABNORMAL LOW (ref 6–23)
CHLORIDE: 93 meq/L — AB (ref 96–112)
CO2: 21 meq/L (ref 19–32)
Calcium: 9.1 mg/dL (ref 8.4–10.5)
Creatinine, Ser: 0.6 mg/dL (ref 0.50–1.10)
GFR calc Af Amer: >90 mL/min (ref >90)
GFR calc non Af Amer: 88 mL/min — ABNORMAL LOW (ref >90)
Glucose, Bld: 138 mg/dL — ABNORMAL HIGH (ref 70–99)
Potassium: 3.6 mEq/L — ABNORMAL LOW (ref 3.7–5.3)
SODIUM: 131 meq/L — AB (ref 137–147)

## 2013-08-01 LAB — COMPREHENSIVE METABOLIC PANEL
ALBUMIN: 3.9 g/dL (ref 3.5–5.2)
ALT: 11 U/L (ref 0–35)
AST: 20 U/L (ref 0–37)
Alkaline Phosphatase: 41 U/L (ref 39–117)
BUN: 6 mg/dL (ref 6–23)
CALCIUM: 8.9 mg/dL (ref 8.4–10.5)
CO2: 21 mEq/L (ref 19–32)
Chloride: 90 mEq/L — ABNORMAL LOW (ref 96–112)
Creatinine, Ser: 0.65 mg/dL (ref 0.50–1.10)
GFR calc Af Amer: >90 mL/min (ref >90)
GFR calc non Af Amer: 86 mL/min — ABNORMAL LOW (ref >90)
Glucose, Bld: 116 mg/dL — ABNORMAL HIGH (ref 70–99)
Potassium: 3.8 mEq/L (ref 3.7–5.3)
SODIUM: 129 meq/L — AB (ref 137–147)
TOTAL PROTEIN: 7.9 g/dL (ref 6.0–8.3)
Total Bilirubin: 0.3 mg/dL (ref 0.3–1.2)

## 2013-08-01 LAB — SALICYLATE LEVEL

## 2013-08-01 LAB — CLOSTRIDIUM DIFFICILE BY PCR: Toxigenic C. Difficile by PCR: NEGATIVE

## 2013-08-01 LAB — AMMONIA: AMMONIA: 23 umol/L (ref 11–60)

## 2013-08-01 LAB — CK: Total CK: 90 U/L (ref 7–177)

## 2013-08-01 LAB — TSH: TSH: 0.875 u[IU]/mL (ref 0.350–4.500)

## 2013-08-01 LAB — RAPID URINE DRUG SCREEN, HOSP PERFORMED
Amphetamines: NOT DETECTED
Barbiturates: NOT DETECTED
Benzodiazepines: NOT DETECTED
Cocaine: NOT DETECTED
OPIATES: NOT DETECTED
TETRAHYDROCANNABINOL: NOT DETECTED

## 2013-08-01 LAB — TROPONIN I: Troponin I: <0.3 ng/mL (ref <0.30)

## 2013-08-01 LAB — T3, FREE: T3 FREE: 2.8 pg/mL (ref 2.3–4.2)

## 2013-08-01 LAB — MRSA PCR SCREENING: MRSA by PCR: NEGATIVE

## 2013-08-01 LAB — GLUCOSE, CAPILLARY: Glucose-Capillary: 194 mg/dL — ABNORMAL HIGH (ref 70–99)

## 2013-08-01 LAB — ACETAMINOPHEN LEVEL: Acetaminophen (Tylenol), Serum: <15 ug/mL (ref 10–30)

## 2013-08-01 LAB — T4, FREE: Free T4: 1.26 ng/dL (ref 0.80–1.80)

## 2013-08-01 LAB — LIPASE, BLOOD: Lipase: 39 U/L (ref 11–59)

## 2013-08-01 MED ORDER — ONDANSETRON HCL 4 MG PO TABS: 4.0000 mg | ORAL_TABLET | Freq: Four times a day (QID) | ORAL | Status: DC | PRN

## 2013-08-01 MED ORDER — METOPROLOL SUCCINATE ER 50 MG PO TB24
50.0000 mg | ORAL_TABLET | Freq: Every day | ORAL | Status: DC
  Administered 2013-08-01 – 2013-08-03 (×3): 50 mg via ORAL
  Filled 2013-08-01 (×3): qty 1

## 2013-08-01 MED ORDER — SODIUM CHLORIDE 0.9 % IV SOLN
INTRAVENOUS | Status: AC
  Administered 2013-08-01: 1000 mL via INTRAVENOUS
  Administered 2013-08-01 (×2): via INTRAVENOUS

## 2013-08-01 MED ORDER — ONDANSETRON HCL 4 MG/2ML IJ SOLN
4.0000 mg | Freq: Once | INTRAMUSCULAR | Status: AC
  Administered 2013-08-01: 4 mg via INTRAVENOUS
  Filled 2013-08-01: qty 2

## 2013-08-01 MED ORDER — SODIUM CHLORIDE 0.9 % IV BOLUS (SEPSIS)
1000.0000 mL | Freq: Once | INTRAVENOUS | Status: AC
Start: 2013-08-01 — End: 2013-08-01
  Administered 2013-08-01: 1000 mL via INTRAVENOUS

## 2013-08-01 MED ORDER — HYDROCORTISONE NA SUCCINATE PF 100 MG IJ SOLR
50.0000 mg | Freq: Three times a day (TID) | INTRAMUSCULAR | Status: AC
  Administered 2013-08-01 – 2013-08-02 (×4): 50 mg via INTRAVENOUS
  Filled 2013-08-01 (×3): qty 2

## 2013-08-01 MED ORDER — HYDROCORTISONE NA SUCCINATE PF 100 MG IJ SOLR
50.0000 mg | Freq: Once | INTRAMUSCULAR | Status: AC
  Administered 2013-08-01: 50 mg via INTRAVENOUS

## 2013-08-01 MED ORDER — ENOXAPARIN SODIUM 40 MG/0.4ML ~~LOC~~ SOLN
40.0000 mg | SUBCUTANEOUS | Status: DC
  Administered 2013-08-01 – 2013-08-03 (×3): 40 mg via SUBCUTANEOUS
  Filled 2013-08-01 (×3): qty 0.4

## 2013-08-01 MED ORDER — PREDNISONE 5 MG PO TABS
15.0000 mg | ORAL_TABLET | Freq: Every day | ORAL | Status: DC
  Administered 2013-08-02 – 2013-08-03 (×2): 15 mg via ORAL
  Filled 2013-08-01 (×3): qty 1

## 2013-08-01 MED ORDER — LORAZEPAM 2 MG/ML IJ SOLN
1.0000 mg | INTRAMUSCULAR | Status: DC | PRN
  Administered 2013-08-02 – 2013-08-03 (×2): 1 mg via INTRAVENOUS
  Filled 2013-08-01 (×2): qty 1

## 2013-08-01 MED ORDER — IOHEXOL 300 MG/ML  SOLN
50.0000 mL | Freq: Once | INTRAMUSCULAR | Status: AC | PRN
  Administered 2013-08-01: 50 mL via ORAL

## 2013-08-01 MED ORDER — LABETALOL HCL 5 MG/ML IV SOLN: 5.0000 mg | INTRAVENOUS | Status: DC | PRN

## 2013-08-01 MED ORDER — ONDANSETRON HCL 4 MG/2ML IJ SOLN
4.0000 mg | Freq: Four times a day (QID) | INTRAMUSCULAR | Status: DC | PRN
  Administered 2013-08-01 (×4): 4 mg via INTRAVENOUS
  Filled 2013-08-01 (×4): qty 2

## 2013-08-01 MED ORDER — LABETALOL HCL 5 MG/ML IV SOLN
INTRAVENOUS | Status: AC
  Administered 2013-08-01: 5 mg
  Filled 2013-08-01: qty 4

## 2013-08-01 MED ORDER — SODIUM CHLORIDE 0.9 % IJ SOLN
3.0000 mL | Freq: Two times a day (BID) | INTRAMUSCULAR | Status: DC
Start: 2013-08-01 — End: 2013-08-04
  Administered 2013-08-02: 3 mL via INTRAVENOUS

## 2013-08-01 MED ORDER — HYDROCORTISONE NA SUCCINATE PF 100 MG IJ SOLR
INTRAMUSCULAR | Status: AC
  Administered 2013-08-01: 100 mg
  Filled 2013-08-01: qty 2

## 2013-08-01 MED ORDER — DEXTROSE 5 % IV SOLN
1.0000 g | Freq: Every day | INTRAVENOUS | Status: DC
  Administered 2013-08-01 – 2013-08-03 (×3): 1 g via INTRAVENOUS
  Filled 2013-08-01 (×3): qty 10

## 2013-08-01 NOTE — Progress Notes (Signed)
ANTICOAGULATION CONSULT NOTE - Initial Consult  Pharmacy Consult for Lovenox Indication: VTE prophylaxis  Allergies  Allergen Reactions  . Hydrocodone-Acetaminophen Nausea And Vomiting    Patient Measurements: Height: 5\' 3"  (160 cm) Weight: 147 lb 14.9 oz (67.1 kg) IBW/kg (Calculated) : 52.4  Vital Signs: Temp: 99 F (37.2 C) (03/11 0600) Temp src: Core (Comment) (03/11 0600) BP: 158/54 mmHg (03/11 0600) Pulse Rate: 98 (03/11 0600)  Labs:  Recent Labs  07/31/13 2348 07/31/13 2359 08/01/13 0026 08/01/13 0202 08/01/13 0515  HGB 12.2 13.9  --   --  11.7*  HCT 35.5* 41.0  --   --  34.7*  PLT 478*  --   --   --  451*  CREATININE  --  0.80 0.65  --  0.60  CKTOTAL  --   --   --  90  --   TROPONINI  --   --   --  <0.30  --     Estimated Creatinine Clearance: 57.6 ml/min (by C-G formula based on Cr of 0.6).   Medical History: Past Medical History  Diagnosis Date  . PONV (postoperative nausea and vomiting)   . Hypertension     takes Metoprolol  . Blood transfusion age 68    tonsillectomy as child  . Headache(784.0)     takes Goody powders  . Arthritis     Hx osteoarthritis  . Anxiety     takes Ativan  . Anemia     comes and goes  . Depression     takes Cymbalta  . Fibromyalgia     pt. had polymyalgia not fibromyalgia    Medications:  Scheduled:  . cefTRIAXone (ROCEPHIN)  IV  1 g Intravenous Q0600  . hydrocortisone sod succinate (SOLU-CORTEF) inj  50 mg Intravenous Q8H  . [START ON 08/02/2013] predniSONE  15 mg Oral Q breakfast  . sodium chloride  3 mL Intravenous Q12H   Infusions:  . sodium chloride 1,000 mL (08/01/13 0411)    Assessment: 73 yo female admitted with acute encephalopathy secondary to presumed drug overdose. Pharmacy consulted to dose Lovenox for VTE prophylaxis.  Weight > 45kg  CrCl > 30 ml/min  CBC ok, no bleeding reported  No anticoagulant use PTA  Goal of Therapy:  Prevention of VTE Monitor platelets by anticoagulation  protocol: Yes   Plan:   Lovenox 40mg  sq q24h  No dosing adjustments anticipated, Pharmacy will sign-off  Peggyann Juba, PharmD, BCPS Pager: [phone] 08/01/2013 8:05 AM
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
## 2013-08-01 NOTE — ED Provider Notes (Signed)
CSN: [id number]     Arrival date & time 07/31/13  2213 History   First MD Initiated Contact with Patient 07/31/13 2301     Chief Complaint  Patient presents with  . Drug Overdose     (Consider location/radiation/quality/duration/timing/severity/associated sxs/prior Treatment) The history is limited by a developmental delay.   History provided by patient and her family bedside. Nausea and confusion over the last 24 hours. Patient is a limited historian. She denies any fevers, abdominal pain, chills, vomiting or diarrhea. She's had some cloudy urine denies any dysuria, urgency or frequency. For back pain. No chest pain or difficulty breathing. Patient denies any fall or head trauma. She takes Tylenol for fibromyalgia and ambien to help her sleep. Daughter bedside concerned that she has taken almost all of her pills for these prescriptions filled a few days ago.  Patient denies any misuse of these medications. Her symptoms are moderate in severity   Past Medical History  Diagnosis Date  . PONV (postoperative nausea and vomiting)   . Hypertension     takes Metoprolol  . Blood transfusion age 23    tonsillectomy as child  . Headache(784.0)     takes Goody powders  . Arthritis     Hx osteoarthritis  . Anxiety     takes Ativan  . Anemia     comes and goes  . Depression     takes Cymbalta  . Fibromyalgia     pt. 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 Tonsillectomy      age 72  . Abdominal hysterectomy  20 yrs. ago  . Rectocele repair  age 66  . Incontinence surgery  4 yrs. ago  . Cystocele repair  age 69 with rectocele  . 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 Colonoscopy with propofol N/A 01/30/2013    Procedure: COLONOSCOPY WITH PROPOFOL;  Surgeon: Garlan Fair, MD;  Location: [facility];  Service:  Endoscopy;  Laterality: N/A;  . Esophagogastroduodenoscopy (egd) with propofol N/A 01/30/2013    Procedure: ESOPHAGOGASTRODUODENOSCOPY (EGD) WITH PROPOFOL;  Surgeon: Garlan Fair, MD;  Location: [facility];  Service: Endoscopy;  Laterality: N/A;   Family History  Problem Relation Age of Onset  . CAD Father   . Stroke Other    History  Substance Use Topics  . Smoking status: Former Smoker -- 0.50 packs/day for 8 years    Quit date: 03/29/1971  . Smokeless tobacco: Not on file  . Alcohol Use: No   OB History   Grav Para Term Preterm Abortions TAB SAB Ect Mult Living                 Review of Systems  Constitutional: Negative for fever and chills.  Eyes: Negative for pain.  Respiratory: Negative for shortness of breath.   Cardiovascular: Negative for chest pain.  Gastrointestinal: Positive for nausea. Negative for abdominal pain.  Genitourinary: Negative for dysuria.  Musculoskeletal: Negative for back pain and neck pain.  Skin: Negative for rash.  Neurological: Negative for syncope and headaches.  All other systems reviewed and are negative.      Allergies  Hydrocodone-acetaminophen  Home Medications   Current Outpatient Rx  Name  Route  Sig  Dispense  Refill  . citalopram (CELEXA) 10 MG tablet   Oral   Take 10 mg  by mouth every morning.         . estrogens, conjugated, (PREMARIN) 0.3 MG tablet   Oral   Take 0.3 mg by mouth 2 (two) times daily.          Marland Kitchen lisinopril (PRINIVIL,ZESTRIL) 5 MG tablet   Oral   Take 5 mg by mouth every morning.         Marland Kitchen LORazepam (ATIVAN) 0.5 MG tablet   Oral   Take 0.5-1 mg by mouth 2 (two) times daily as needed for anxiety.          . metoprolol (TOPROL-XL) 50 MG 24 hr tablet   Oral   Take 50 mg by mouth every evening.          . mirtazapine (REMERON) 7.5 MG tablet   Oral   Take 3.75 mg by mouth at bedtime.         . Multiple Vitamin (MULTIVITAMIN WITH MINERALS) TABS tablet   Oral   Take 1 tablet by  mouth every morning.         . ondansetron (ZOFRAN-ODT) 4 MG disintegrating tablet   Oral   Take 4 mg by mouth every 8 (eight) hours as needed for nausea or vomiting.         Marland Kitchen oxybutynin (DITROPAN-XL) 10 MG 24 hr tablet   Oral   Take 10 mg by mouth at bedtime.          . predniSONE (DELTASONE) 5 MG tablet   Oral   Take 5 mg by mouth every morning.          . traMADol (ULTRAM) 50 MG tablet   Oral   Take 50 mg by mouth every 6 (six) hours as needed for pain.         Marland Kitchen zolpidem (AMBIEN) 10 MG tablet   Oral   Take 5-10 mg by mouth at bedtime.           BP 148/81  Pulse 128  Temp(Src) 97.6 F (36.4 C) (Oral)  SpO2 97% Physical Exam  Constitutional: She is oriented to person, place, and time. She appears well-developed and well-nourished.  HENT:  Head: Normocephalic and atraumatic.  Dry mucous membranes  Eyes: EOM are normal. Pupils are equal, round, and reactive to light.  Neck: Neck supple.  Cardiovascular: Intact distal pulses.   Tachycardia  Pulmonary/Chest: Effort normal and breath sounds normal. No respiratory distress.  Abdominal: Soft. She exhibits no distension. There is no tenderness. There is no rebound and no guarding.  Musculoskeletal: Normal range of motion. She exhibits no edema.  Neurological: She is alert and oriented to person, place, and time.  Awake, keeps her eyes open, is somewhat sleepy but answers basic questions appropriately, unless questioned specifically about her medications. no unilateral or focal deficits.  Skin: Skin is warm and dry.    ED Course  Procedures (including critical care time) Labs Review Labs Reviewed  CBC - Abnormal; Notable for the following:    WBC 14.5 (*)    RBC 3.70 (*)    HCT 35.5 (*)    Platelets 478 (*)    All other components within normal limits  URINALYSIS, ROUTINE W REFLEX MICROSCOPIC - Abnormal; Notable for the following:    APPearance CLOUDY (*)    Hgb urine dipstick MODERATE (*)     Leukocytes, UA LARGE (*)    All other components within normal limits  URINE MICROSCOPIC-ADD ON - Abnormal; Notable for the following:    Bacteria,  UA MANY (*)    All other components within normal limits  COMPREHENSIVE METABOLIC PANEL - Abnormal; Notable for the following:    Sodium 129 (*)    Chloride 90 (*)    Glucose, Bld 116 (*)    GFR calc non Af Amer 86 (*)    All other components within normal limits  SALICYLATE LEVEL - Abnormal; Notable for the following:    Salicylate Lvl 123456 (*)    All other components within normal limits  BASIC METABOLIC PANEL - Abnormal; Notable for the following:    Sodium 131 (*)    Potassium 3.6 (*)    Chloride 93 (*)    Glucose, Bld 138 (*)    BUN 4 (*)    GFR calc non Af Amer 88 (*)    All other components within normal limits  CBC WITH DIFFERENTIAL - Abnormal; Notable for the following:    WBC 13.2 (*)    RBC 3.61 (*)    Hemoglobin 11.7 (*)    HCT 34.7 (*)    Platelets 451 (*)    Neutrophils Relative % 87 (*)    Neutro Abs 11.5 (*)    Lymphocytes Relative 7 (*)    All other components within normal limits  HEPATIC FUNCTION PANEL - Abnormal; Notable for the following:    Alkaline Phosphatase 38 (*)    All other components within normal limits  I-STAT CHEM 8, ED - Abnormal; Notable for the following:    Sodium 126 (*)    Potassium 5.5 (*)    Chloride 93 (*)    Glucose, Bld 115 (*)    Calcium, Ion 1.01 (*)    All other components within normal limits  CULTURE, BLOOD (ROUTINE X 2)  CULTURE, BLOOD (ROUTINE X 2)  CLOSTRIDIUM DIFFICILE BY PCR  MRSA PCR SCREENING  URINE RAPID DRUG SCREEN (HOSP PERFORMED)  TROPONIN I  CK  ACETAMINOPHEN LEVEL  AMMONIA  TSH  T4, FREE  T3, FREE  I-STAT CG4 LACTIC ACID, ED   Imaging Review Dg Chest Portable 1 View  07/31/2013   CLINICAL DATA Drug overdose.  EXAM PORTABLE CHEST - 1 VIEW  COMPARISON DG CHEST 2 VIEW dated 10/21/2011  FINDINGS Cardiomediastinal silhouette is unremarkable. The lungs are  clear without pleural effusions or focal consolidations. Stable appearance the right midlung zone calcified granuloma. Trachea projects midline and there is no pneumothorax. Soft tissue planes and included osseous structures are non-suspicious. Multiple EKG lines overlie the patient and may obscure subtle underlying pathology.  IMPRESSION No acute cardiopulmonary process.  SIGNATURE  Electronically Signed   By: Elon Alas   On: 07/31/2013 23:58     EKG Interpretation   Date/Time:  Tuesday July 31 2013 22:26:24 EDT Ventricular Rate:  89 PR Interval:  148 QRS Duration: 87 QT Interval:  380 QTC Calculation: 462 R Axis:   22 Text Interpretation:  Sinus rhythm Atrial premature complex Nonspecific ST  abnormality Confirmed by Sydnie Sigmund  MD, Heleena Miceli ([id number]) on 08/01/2013 1:37:05 AM     IV fluids. IV Zofran. IV Rocephin.  Discussed with hospitalist, plan admit  MDM   Diagnosis: Altered mental status, UTI, dehydration  Question of inappropriate use of her medications, taking too much Tylenol and ambien Evaluated with labs and imaging review as above. EKG normal sinus rhythm Medications provided MED admit    Teressa Lower, MD 08/01/13 [phone]

## 2013-08-01 NOTE — Progress Notes (Signed)
ANTIBIOTIC CONSULT NOTE - INITIAL  Pharmacy Consult for Ceftriaxone Indication: UTI  Allergies  Allergen Reactions  . Hydrocodone-Acetaminophen Nausea And Vomiting    Patient Measurements: Height: 5\' 3"  (160 cm) Weight: 147 lb 14.9 oz (67.1 kg) IBW/kg (Calculated) : 52.4 Adjusted Body Weight:   Vital Signs: Temp: 97.9 F (36.6 C) (03/11 0348) Temp src: Oral (03/11 0348) BP: 171/65 mmHg (03/11 0400) Pulse Rate: 103 (03/11 0400) Intake/Output from previous day:   Intake/Output from this shift:    Labs:  Recent Labs  07/31/13 2348 07/31/13 2359 08/01/13 0026  WBC 14.5*  --   --   HGB 12.2 13.9  --   PLT 478*  --   --   CREATININE  --  0.80 0.65   Estimated Creatinine Clearance: 57.6 ml/min (by C-G formula based on Cr of 0.65). No results found for this basename: VANCOTROUGH, VANCOPEAK, VANCORANDOM, GENTTROUGH, GENTPEAK, GENTRANDOM, TOBRATROUGH, TOBRAPEAK, TOBRARND, AMIKACINPEAK, AMIKACINTROU, AMIKACIN,  in the last 72 hours   Microbiology: No results found for this or any previous visit (from the past 720 hour(s)).  Medical History: Past Medical History  Diagnosis Date  . PONV (postoperative nausea and vomiting)   . Hypertension     takes Metoprolol  . Blood transfusion age 24    tonsillectomy as child  . Headache(784.0)     takes Goody powders  . Arthritis     Hx osteoarthritis  . Anxiety     takes Ativan  . Anemia     comes and goes  . Depression     takes Cymbalta  . Fibromyalgia     pt. had polymyalgia not fibromyalgia    Medications:  Anti-infectives   Start     Dose/Rate Route Frequency Ordered Stop   08/01/13 0600  cefTRIAXone (ROCEPHIN) 1 g in dextrose 5 % 50 mL IVPB     1 g 100 mL/hr over 30 Minutes Intravenous Daily 08/01/13 0410       Assessment: Patient with UTI.  Goal of Therapy:   Rocephin based on manufacturer dosing recommendations.  Plan:   Ceftriaxone 1gm iv q24hr  Nani Skillern Crowford 08/01/2013,4:48 AM

## 2013-08-01 NOTE — Progress Notes (Signed)
CARE MANAGEMENT NOTE 08/01/2013  Patient:  Eileen Jennings, Eileen Jennings   Account Number:  [id number]  Date Initiated:  08/01/2013  Documentation initiated by:  Cesare Sumlin  Subjective/Objective Assessment:   substance abuse with overdose.     Action/Plan:   tbd   Anticipated DC Date:  08/04/2013   Anticipated DC Plan:  HOME/SELF CARE  In-house referral  Clinical Social Worker      DC Planning Services  NA      [facility] Choice  NA   Choice offered to / List presented to:  NA   DME arranged  NA      DME agency  NA     [place] arranged  NA      [place] agency  NA   Status of service:  In process, will continue to follow Medicare Important Message given?  NA - LOS <3 / Initial given by admissions (If response is "NO", the following Medicare IM given date fields will be blank) Date Medicare IM given:   Date Additional Medicare IM given:    Discharge Disposition:    Per UR Regulation:  Reviewed for med. necessity/level of care/duration of stay  If discussed at [place] of Stay Meetings, dates discussed:    Comments:  [id number]/Jayven Naill Eldridge Dace, [place], [place] [phone] Chart Reviewed for discharge and hospital needs. Discharge needs at time of review:  None present will follow for needs. Review of patient progress due on [id number].

## 2013-08-01 NOTE — Progress Notes (Signed)
Spoke with david from poison control and given update.

## 2013-08-01 NOTE — ED Notes (Signed)
Daughter's number [phone]

## 2013-08-01 NOTE — Progress Notes (Signed)
Conversation with husband Eileen Jennings, he has advised that their daughter Eileen Jennings is going to removed medications from the home and she will administer as needed. Eileen Jennings voiced that this has happened before and appeared very frustrated.   There is a list of medications in the paper chart that was prepared by [facility]. 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

## 2013-08-01 NOTE — ED Notes (Signed)
Pt going to CT before going to floor.

## 2013-08-01 NOTE — H&P (Signed)
[facility] History and Physical  [name] TKZ:[id number] DOB: November 25, 1940 DOA: 07/31/2013  Referring physician: ER physician. PCP: Mathews Argyle, MD   Chief Complaint: Confusion.  History obtained from patient's daughter.  HPI: Eileen Jennings is a 73 y.o. 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 Patient has been confused for last 2-3 days and was taken to her primary care physician on March 9. Over there it was found that patient's Ambien which was filled on March 5 with 30 tablets 10 mg had only 5 tablets left. Patient also had filled Toradol on February 17 60 tablets of which only 14 were left. Patient does admit to have taken Ambien. Patient's husband states that she has been telling that she does not want to live with so much pain but presently denies any suicidal ideation. Patient has been having persistent nausea but has not thrown up and has epigastric pain. Denies any chest pain or shortness of breath. At the PCPs office since patient has been having issues with sleep patient was instructed to give half dose of her regular Ambien of 10 mg and also was started on Celexa and Remeron both of which has not been taken yet. In the ER labs show mild hyponatremia and UA shows features consistent with UTI. Patient's daughter states that she has been having subjective feeling of fever and chills but in the ER patient was afebrile. Patient has fine tremors. Patient's daughter also states that she has had previous episodes where she had taken too much of her medications.  Review of Systems: As presented in the history of presenting illness, rest negative.  Past Medical History  Diagnosis Date  . PONV (postoperative nausea and vomiting)   . Hypertension     takes Metoprolol  . Blood transfusion age 3     tonsillectomy as child  . Headache(784.0)     takes Goody powders  . Arthritis     Hx osteoarthritis  . Anxiety     takes Ativan  . Anemia     comes and goes  . Depression     takes Cymbalta  . Fibromyalgia     pt. 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 Tonsillectomy      age 52  . Abdominal hysterectomy  20 yrs. ago  . Rectocele repair  age 59  . Incontinence surgery  4 yrs. ago  . Cystocele repair  age 89 with rectocele  . 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 Colonoscopy with propofol N/A 01/30/2013    Procedure: COLONOSCOPY WITH PROPOFOL;  Surgeon: Garlan Fair, MD;  Location: [facility];  Service: Endoscopy;  Laterality: N/A;  . Esophagogastroduodenoscopy (egd) with propofol N/A 01/30/2013    Procedure: ESOPHAGOGASTRODUODENOSCOPY (EGD) WITH PROPOFOL;  Surgeon: Garlan Fair, MD;  Location: [facility];  Service: Endoscopy;  Laterality: N/A;   Social History:  reports that she quit smoking about 42 years ago. She does not have any smokeless tobacco history on file. She reports that she does not drink alcohol or use illicit drugs. Where does patient live home. Can patient participate in ADLs? Yes.  Allergies  Allergen Reactions  . Hydrocodone-Acetaminophen Nausea And Vomiting  Family History:  Family History  Problem Relation Age of Onset  . CAD Father   . Stroke Other       Prior to Admission medications   Medication Sig Start Date End Date Taking? Authorizing Provider  citalopram (CELEXA) 10 MG tablet Take 10 mg by mouth every morning.   Yes Historical Provider, MD  estrogens, conjugated, (PREMARIN) 0.3 MG tablet Take 0.3 mg by mouth 2 (two) times daily.    Yes Historical Provider, MD  lisinopril (PRINIVIL,ZESTRIL) 5 MG tablet Take 5 mg by mouth every morning.   Yes Historical  Provider, MD  LORazepam (ATIVAN) 0.5 MG tablet Take 0.5-1 mg by mouth 2 (two) times daily as needed for anxiety.    Yes Historical Provider, MD  metoprolol (TOPROL-XL) 50 MG 24 hr tablet Take 50 mg by mouth every evening.    Yes Historical Provider, MD  mirtazapine (REMERON) 7.5 MG tablet Take 3.75 mg by mouth at bedtime.   Yes Historical Provider, MD  Multiple Vitamin (MULTIVITAMIN WITH MINERALS) TABS tablet Take 1 tablet by mouth every morning.   Yes Historical Provider, MD  ondansetron (ZOFRAN-ODT) 4 MG disintegrating tablet Take 4 mg by mouth every 8 (eight) hours as needed for nausea or vomiting.   Yes Historical Provider, MD  oxybutynin (DITROPAN-XL) 10 MG 24 hr tablet Take 10 mg by mouth at bedtime.    Yes Historical Provider, MD  predniSONE (DELTASONE) 5 MG tablet Take 5 mg by mouth every morning.    Yes Historical Provider, MD  traMADol (ULTRAM) 50 MG tablet Take 50 mg by mouth every 6 (six) hours as needed for pain.   Yes Historical Provider, MD  zolpidem (AMBIEN) 10 MG tablet Take 5-10 mg by mouth at bedtime.    Yes Historical Provider, MD    Physical Exam: Filed Vitals:   07/31/13 2335 07/31/13 2336 07/31/13 2337 08/01/13 0144  BP: 188/76 140/84 148/81 153/81  Pulse: 109 118 128 100  Temp:    98.2 F (36.8 C)  TempSrc:    Oral  Resp:    20  SpO2:    97%     General:  Well-developed well-nourished.  Eyes: Anicteric no pallor.  ENT: No discharge from the ears eyes nose mouth.  Neck: No mass felt.  Cardiovascular: S1-S2 heard.  Respiratory: No rhonchi or crepitations.  Abdomen: Soft nontender bowel sounds present. No guarding or rigidity.  Skin: No rash.  Musculoskeletal: No edema.  Psychiatric: Patient is confused but follows commands.  Neurologic: Patient is alert and awake and presently oriented to time place and person but appears confused and does not recall going to her doctor 2 days ago. Moves all extremities 5 x 5. No neck rigidity. PERRLA positive. No  facial symmetry.  Labs on Admission:  Basic Metabolic Panel:  Recent Labs Lab 07/31/13 2359 08/01/13 0026  NA 126* 129*  K 5.5* 3.8  CL 93* 90*  CO2  --  21  GLUCOSE 115* 116*  BUN 7 6  CREATININE 0.80 0.65  CALCIUM  --  8.9   Liver Function Tests:  Recent Labs Lab 08/01/13 0026  AST 20  ALT 11  ALKPHOS 41  BILITOT 0.3  PROT 7.9  ALBUMIN 3.9   No results found for this basename: LIPASE, AMYLASE,  in the last 168 hours No results found for this basename: AMMONIA,  in the last 168 hours CBC:  Recent Labs Lab 07/31/13 2348 07/31/13 2359  WBC 14.5*  --   HGB 12.2 13.9  HCT 35.5* 41.0  MCV 95.9  --   PLT 478*  --    Cardiac Enzymes: No results found for this basename: CKTOTAL, CKMB, CKMBINDEX, TROPONINI,  in the last 168 hours  BNP (last 3 results) No results found for this basename: PROBNP,  in the last 8760 hours CBG: No results found for this basename: GLUCAP,  in the last 168 hours  Radiological Exams on Admission: Dg Chest Portable 1 View  07/31/2013   CLINICAL DATA Drug overdose.  EXAM PORTABLE CHEST - 1 VIEW  COMPARISON DG CHEST 2 VIEW dated 10/21/2011  FINDINGS Cardiomediastinal silhouette is unremarkable. The lungs are clear without pleural effusions or focal consolidations. Stable appearance the right midlung zone calcified granuloma. Trachea projects midline and there is no pneumothorax. Soft tissue planes and included osseous structures are non-suspicious. Multiple EKG lines overlie the patient and may obscure subtle underlying pathology.  IMPRESSION No acute cardiopulmonary process.  SIGNATURE  Electronically Signed   By: Elon Alas   On: 07/31/2013 23:58    EKG: Independently reviewed. Normal sinus rhythm. QTC of 462.  Assessment/Plan Principal Problem:   Acute encephalopathy Active Problems:   Polymyalgia rheumatica   Drug overdose   UTI (lower urinary tract infection)   HTN (hypertension)   Hyponatremia   Depression   1. Acute  encephalopathy probably secondary to drug overdose and UTI -  Patient probably could have overdose with Ambien and also Tramadol. In addition patient has UTI for which ceftriaxone has been started. I have discussed with the poison control at this time they have recommended holding those medications and when necessary IV Ativan. Patient will be closely observed in step down overnight. CT head is pending. Ammonia levels, thyroid function Tylenol levels and salicylate levels are also pending. IV hydration. 2. Persistent nausea - could be related to adrenal crisis. CT abdomen and pelvis is pending. Follow LFTs closely. Continue to hydrate. 3. UTI - patient has been placed on ceftriaxone. 4. Depression and anxiety - I have held patient's Ambien tramadol Celexa and Remeron. For now patient will be on when necessary IV Ativan. Patient did talk about suicide last week but presently is not suicidal or homicidal. Consult psychiatry in a.m. 5. History of hypertension - since patient is orthostatic presently I'm holding off patient's antihypertensives and hydrating and placing patient on when necessary IV labetalol for systolic blood pressure more than 160. 6. History of polymyalgia rheumatica - since patient is placed n.p.o. for now until CT I have placed patient on stress dose steroids IV hydrocortisone. 7. Hyponatremia - mild probably from dehydration and adrenal crisis. 8. Leukocytosis - probably from stress and adrenal crisis and UTI. Blood cultures and urine cultures are pending.    Code Status: Full code.  Family Communication: Patient's daughter and husband.  Disposition Plan: Admit to inpatient.    KAKRAKANDY,ARSHAD N. [facility] Pager [phone].  If 7PM-7AM, please contact night-coverage [email or web address] Password [email or web address] 08/01/2013, 1:56 AM

## 2013-08-01 NOTE — Progress Notes (Addendum)
[facility] PROGRESS NOTE  Eileen Jennings DQQ:[id number] DOB: 09-Dec-1940 DOA: 07/31/2013 PCP: Mathews Argyle, MD  Assessment/Plan  1. Acute encephalopathy probably secondary to drug overdose and UTI  - Patient probably could have overdose with Ambien and also Tramadol - UTI on ceftriaxone has been started.  -  CT head is neg -   Ammonia level 23 -  TSH pending -  Tylenol < 15 and salicylate < 2 2. Persistent nausea and now with diarrhea.  May be side effect of overdose, withdrawal from citalopram, mirtazapine, adrenal crisis, gastroenteritis -  CT abdomen and pelvis neg for acute abl -  LFTs wnl. Continue to hydrate -  Continue stress dose steroids -  Start 3x3, prednisone 15mg  daily x 3 days tomorrow, then resume previous dose 3. UTI - patient has been placed on ceftriaxone. 4. Depression and anxiety -  -  Cont to hold Ambien tramadol Celexa and Remeron.  -  continue IV Ativan.  -  Patient did talk about suicide last week but presently is not suicidal or homicidal.  5. Hypertension - BP elevated -  Decrease steroids -  Restart metoprolol  -  Cont prn labatelol 6.  Polymyalgia rheumatica -  -  Continue steroids 7.  Hyponatremia - mild probably from dehydration and adrenal crisis. 8.  Leukocytosis - probably from stress and adrenal crisis and UTI. -   Blood cultures and urine cultures  -  C. Diff PCR pending -  Continue enteric precautions  Diet:  CLD Access:  PIV IVF:  yes Proph:  lovenox  Code Status: full Family Communication: patient and her husband Disposition Plan: pending improvement in mentation   Consultants:  None, but consider psychiatry consultation once mentation improved  Procedures:  CT head  CT abd/pelvis  Antibiotics:  Ceftriaxone 3/11>>   HPI/Subjective:  Still confused.  Had watery diarrhea overnight and persistent nausea.      Objective: Filed Vitals:   08/01/13 0348 08/01/13 0400 08/01/13 0500 08/01/13 0600  BP: 173/78  171/65 158/80 158/54  Pulse: 101 103 99 98  Temp: 97.9 F (36.6 C)  98.6 F (37 C) 99 F (37.2 C)  TempSrc: Oral  Core (Comment) Core (Comment)  Resp: 13 12 16 17   Height: 5\' 3"  (1.6 m)     Weight: 67.1 kg (147 lb 14.9 oz)     SpO2: 97% 96% 97% 96%    Intake/Output Summary (Last 24 hours) at 08/01/13 0751 Last data filed at 08/01/13 0600  Gross per 24 hour  Intake 277.08 ml  Output    350 ml  Net -72.92 ml   Filed Weights   08/01/13 0348  Weight: 67.1 kg (147 lb 14.9 oz)    Exam:   General:  CF, No acute distress  HEENT:  NCAT, MMM  Cardiovascular:  Mild tachycardia, RR, nl S1, S2 no mrg, 2+ pulses, warm extremities  Respiratory:  CTAB, no increased WOB  Abdomen:   Hyperactive BS, soft, NT/ND  MSK:   Normal tone and bulk, no LEE  Neuro:  Grossly moves all extremities  Psych:  Oriented to person, place, and reason for being here, but unclear about month, date, year.  Tremulous and anxious.  Calling for nurse frequently  Data Reviewed: Basic Metabolic Panel:  Recent Labs Lab 07/31/13 2359 08/01/13 0026 08/01/13 0515  NA 126* 129* 131*  K 5.5* 3.8 3.6*  CL 93* 90* 93*  CO2  --  21 21  GLUCOSE 115* 116* 138*  BUN 7 6  4*  CREATININE 0.80 0.65 0.60  CALCIUM  --  8.9 9.1   Liver Function Tests:  Recent Labs Lab 08/01/13 0026 08/01/13 0515  AST 20 19  ALT 11 11  ALKPHOS 41 38*  BILITOT 0.3 0.4  PROT 7.9 7.4  ALBUMIN 3.9 3.7   No results found for this basename: LIPASE, AMYLASE,  in the last 168 hours  Recent Labs Lab 08/01/13 0202  AMMONIA 23   CBC:  Recent Labs Lab 07/31/13 2348 07/31/13 2359 08/01/13 0515  WBC 14.5*  --  13.2*  NEUTROABS  --   --  11.5*  HGB 12.2 13.9 11.7*  HCT 35.5* 41.0 34.7*  MCV 95.9  --  96.1  PLT 478*  --  451*   Cardiac Enzymes:  Recent Labs Lab 08/01/13 0202  CKTOTAL 90  TROPONINI <0.30   BNP (last 3 results) No results found for this basename: PROBNP,  in the last 8760 hours CBG: No results  found for this basename: GLUCAP,  in the last 168 hours  Recent Results (from the past 240 hour(s))  MRSA PCR SCREENING     Status: None   Collection Time    08/01/13  5:10 AM      Result Value Ref Range Status   MRSA by PCR NEGATIVE  NEGATIVE Final   Comment:            The GeneXpert MRSA Assay (FDA     approved for NASAL specimens     only), is one component of a     comprehensive MRSA colonization     surveillance program. It is not     intended to diagnose MRSA     infection nor to guide or     monitor treatment for     MRSA infections.     Studies: Ct Abdomen Pelvis Wo Contrast  08/01/2013   CLINICAL DATA Abdominal pain, leukocytosis.  EXAM CT ABDOMEN AND PELVIS WITHOUT CONTRAST  TECHNIQUE Multidetector CT imaging of the abdomen and pelvis was performed following the standard protocol without intravenous contrast.  COMPARISON None.  FINDINGS Lung bases are predominantly clear.  Moderate-sized hiatal hernia.  Organ evaluation limited without intravenous contrast. Within this limitation, central hypodensity within the liver on image 19 is favored to reflect a biliary cyst or hamartoma.  Contracted gallbladder. No radiodense gallstones. No biliary ductal dilatation.  No appreciable abnormality of the pancreas, spleen, adrenal glands.  Symmetric renal size. There are several nonobstructing stones within the lower pole on the right. No hydroureteronephrosis. Difficult to follow the ureters in their entirety given the decompressed state.  Decompressed colon limits evaluation. Normal appendix. No bowel obstruction. Small bowel loops are of normal caliber. No perienteric fat stranding. Tiny fat containing umbilical hernia. Small duodenal diverticula. No free intraperitoneal air or fluid. No lymphadenopathy.  Scattered atherosclerotic disease of the aorta and branch vessels without aneurysmal dilatation.  Thin walled bladder. Absent uterus. No adnexal mass. Pelvic floor laxity.  Gentle leftward  curvature of the lumbar spine. No acute osseous finding.  IMPRESSION No acute abdominopelvic process identified by unenhanced CT.  Nonobstructing right renal stones. No perinephric fat stranding or hydroureteronephrosis.  SIGNATURE  Electronically Signed   By: Carlos Levering M.D.   On: 08/01/2013 03:38   Ct Head Wo Contrast  08/01/2013   CLINICAL DATA Altered mental status, medication overdose for 4 days.  EXAM CT HEAD WITHOUT CONTRAST  TECHNIQUE Contiguous axial images were obtained from the base of the skull through the  vertex without intravenous contrast.  COMPARISON MR HEAD WO/W CM dated 10/10/2007  FINDINGS The ventricles and sulci are normal for age. No intraparenchymal hemorrhage, mass effect nor midline shift. Patchy supratentorial white matter hypodensities are within normal range for patient's age and though non-specific suggest sequelae of chronic small vessel ischemic disease. No acute large vascular territory infarcts.  No abnormal extra-axial fluid collections. Basal cisterns are patent. Minimal calcific atherosclerosis of the carotid siphons.  No skull fracture. Small bilateral maxillary sinus air-fluid levels. Soft Frothy secretions in the sphenoid sinuses. tissue density within the right mastoid air cells. . The included ocular globes and orbital contents are non-suspicious.  IMPRESSION No acute intracranial process.  Acute paranasal sinusitis with right mastoid effusion.  SIGNATURE  Electronically Signed   By: Elon Alas   On: 08/01/2013 03:22   Dg Chest Portable 1 View  07/31/2013   CLINICAL DATA Drug overdose.  EXAM PORTABLE CHEST - 1 VIEW  COMPARISON DG CHEST 2 VIEW dated 10/21/2011  FINDINGS Cardiomediastinal silhouette is unremarkable. The lungs are clear without pleural effusions or focal consolidations. Stable appearance the right midlung zone calcified granuloma. Trachea projects midline and there is no pneumothorax. Soft tissue planes and included osseous structures are  non-suspicious. Multiple EKG lines overlie the patient and may obscure subtle underlying pathology.  IMPRESSION No acute cardiopulmonary process.  SIGNATURE  Electronically Signed   By: Elon Alas   On: 07/31/2013 23:58    Scheduled Meds: . cefTRIAXone (ROCEPHIN)  IV  1 g Intravenous Q0600  . hydrocortisone sod succinate (SOLU-CORTEF) inj  50 mg Intravenous Q8H  . sodium chloride  3 mL Intravenous Q12H   Continuous Infusions: . sodium chloride 1,000 mL (08/01/13 0411)    Principal Problem:   Acute encephalopathy Active Problems:   Polymyalgia rheumatica   Drug overdose   UTI (lower urinary tract infection)   HTN (hypertension)   Hyponatremia   Depression    Time spent: 30 min    Zachary Lovins, [facility] Pager [phone]. If 7PM-7AM, please contact night-coverage at [email or web address], password [email or web address] 08/01/2013, 7:51 AM  LOS: 1 day

## 2013-08-02 DIAGNOSIS — G9340 Encephalopathy, unspecified: Secondary | ICD-10-CM | POA: Diagnosis not present

## 2013-08-02 DIAGNOSIS — F329 Major depressive disorder, single episode, unspecified: Secondary | ICD-10-CM | POA: Diagnosis not present

## 2013-08-02 DIAGNOSIS — I10 Essential (primary) hypertension: Secondary | ICD-10-CM

## 2013-08-02 DIAGNOSIS — F3289 Other specified depressive episodes: Secondary | ICD-10-CM

## 2013-08-02 DIAGNOSIS — M353 Polymyalgia rheumatica: Secondary | ICD-10-CM

## 2013-08-02 LAB — CBC
HCT: 34.4 % — ABNORMAL LOW (ref 36.0–46.0)
HEMOGLOBIN: 11.3 g/dL — AB (ref 12.0–15.0)
MCH: 32.3 pg (ref 26.0–34.0)
MCHC: 32.8 g/dL (ref 30.0–36.0)
MCV: 98.3 fL (ref 78.0–100.0)
Platelets: 422 10*3/uL — ABNORMAL HIGH (ref 150–400)
RBC: 3.5 MIL/uL — AB (ref 3.87–5.11)
RDW: 14.8 % (ref 11.5–15.5)
WBC: 10.7 10*3/uL — ABNORMAL HIGH (ref 4.0–10.5)

## 2013-08-02 LAB — GLUCOSE, CAPILLARY
GLUCOSE-CAPILLARY: 102 mg/dL — AB (ref 70–99)
Glucose-Capillary: 98 mg/dL (ref 70–99)
Glucose-Capillary: 182 mg/dL — ABNORMAL HIGH (ref 70–99)

## 2013-08-02 LAB — BASIC METABOLIC PANEL
BUN: 2 mg/dL — ABNORMAL LOW (ref 6–23)
CHLORIDE: 102 meq/L (ref 96–112)
CO2: 25 meq/L (ref 19–32)
Calcium: 8.9 mg/dL (ref 8.4–10.5)
Creatinine, Ser: 0.66 mg/dL (ref 0.50–1.10)
GFR calc Af Amer: >90 mL/min (ref >90)
GFR calc non Af Amer: 86 mL/min — ABNORMAL LOW (ref >90)
Glucose, Bld: 118 mg/dL — ABNORMAL HIGH (ref 70–99)
POTASSIUM: 4.2 meq/L (ref 3.7–5.3)
Sodium: 139 mEq/L (ref 137–147)

## 2013-08-02 NOTE — Progress Notes (Signed)
[facility] PROGRESS NOTE  Eileen Jennings DJS:[id number] DOB: October 28, 1940 DOA: 07/31/2013 PCP: Mathews Argyle, MD  Assessment/Plan  1. Acute encephalopathy probably secondary to drug overdose and UTI  - Patient probably could have overdose with Ambien and also Tramadol -  UTI on ceftriaxone  -  CT head is neg -   Ammonia level 23 -  TSH 0.875 -  Tylenol < 15 and salicylate < 2 2. Persistent nausea and now with diarrhea.  May be side effect of overdose, withdrawal from citalopram, mirtazapine, adrenal crisis, gastroenteritis -  CT abdomen and pelvis neg for acute abl -  LFTs and lipase wnl -  C. Diff PCR neg -  Day 2 of 3 of higher dose steroids today -  D/c hydrocortisone, start prednisone 15mg  daily today and tomorrow -  Advance to healthy heart diet 3. UTI - continue ceftriaxone -  Urine cx not sent 4. Depression and anxiety -  -  Cont to hold Ambien tramadol Celexa and Remeron.  -  continue IV Ativan prn and plan a tapering dose once home -  Appreciate psychiatry recommendations Hypertension - BP elevated -  Continue metoprolol  -  Cont prn labatelol 6.  Polymyalgia rheumatica -  -  Continue steroids  7.  Hyponatremia - mild probably from dehydration and resolved 8.  Leukocytosis - probably from stress and adrenal crisis and UTI. -   Blood cultures NGTD 9.  Normocytic anemia, defer to PCP if work up not already complete.  No need for blood transfusion as hemoglobin well above 7mg /dl   Diet:  Healthy heart Access:  PIV IVF:  yes Proph:  lovenox  Code Status: full Family Communication: patient and her husband Disposition Plan:   If nausea, vomiting, diarrhea resolving, temperature trending down, tolerates removal of catheter, consider d/c later today vs. tomorrow   Consultants:  Psychiatry  Procedures:  CT head  CT abd/pelvis  Antibiotics:  Ceftriaxone 3/11>>   HPI/Subjective:  Nausea persistent, but diarrhea resolved.  Has some mild abdominal  soreness,  Thinking is clearer and she slept well last night with 1mg  of ativan.        Objective: Filed Vitals:   08/02/13 0400 08/02/13 0500 08/02/13 0600 08/02/13 0700  BP: 115/37  131/71   Pulse: 78 74 73 90  Temp: 99.3 F (37.4 C) 99.1 F (37.3 C) 99.1 F (37.3 C) 98.8 F (37.1 C)  TempSrc: Core (Comment)     Resp: 14 13 14 22   Height:      Weight:  65.8 kg (145 lb 1 oz)    SpO2: 92% 93% 94% 95%    Intake/Output Summary (Last 24 hours) at 08/02/13 0744 Last data filed at 08/02/13 0600  Gross per 24 hour  Intake   2465 ml  Output   3350 ml  Net   -885 ml   Filed Weights   08/01/13 0348 08/02/13 0500  Weight: 67.1 kg (147 lb 14.9 oz) 65.8 kg (145 lb 1 oz)    Exam:   General:  CF, No acute distress  HEENT:  NCAT, MMM, no tongue fasciculations  Cardiovascular:  RRR, nl S1, S2 no mrg, 2+ pulses, warm extremities  Respiratory:  CTAB, no increased WOB  Abdomen:   NABS, soft, NT/ND  MSK:   Normal tone and bulk, no LEE  Neuro:  Grossly moves all extremities, fine tremor  Psych:  Oriented to person, place, and reason for being here.  Less tremulous and anxious this morning  Data  Reviewed: Basic Metabolic Panel:  Recent Labs Lab 07/31/13 2359 08/01/13 0026 08/01/13 0515 08/02/13 0325  NA 126* 129* 131* 139  K 5.5* 3.8 3.6* 4.2  CL 93* 90* 93* 102  CO2  --  21 21 25   GLUCOSE 115* 116* 138* 118*  BUN 7 6 4* 2*  CREATININE 0.80 0.65 0.60 0.66  CALCIUM  --  8.9 9.1 8.9   Liver Function Tests:  Recent Labs Lab 08/01/13 0026 08/01/13 0515  AST 20 19  ALT 11 11  ALKPHOS 41 38*  BILITOT 0.3 0.4  PROT 7.9 7.4  ALBUMIN 3.9 3.7    Recent Labs Lab 08/01/13 0515  LIPASE 39    Recent Labs Lab 08/01/13 0202  AMMONIA 23   CBC:  Recent Labs Lab 07/31/13 2348 07/31/13 2359 08/01/13 0515 08/02/13 0325  WBC 14.5*  --  13.2* 10.7*  NEUTROABS  --   --  11.5*  --   HGB 12.2 13.9 11.7* 11.3*  HCT 35.5* 41.0 34.7* 34.4*  MCV 95.9  --  96.1  98.3  PLT 478*  --  451* 422*   Cardiac Enzymes:  Recent Labs Lab 08/01/13 0202  CKTOTAL 90  TROPONINI <0.30   BNP (last 3 results) No results found for this basename: PROBNP,  in the last 8760 hours CBG:  Recent Labs Lab 08/01/13 1215 08/02/13 0625  GLUCAP 194* 102*    Recent Results (from the past 240 hour(s))  CLOSTRIDIUM DIFFICILE BY PCR     Status: None   Collection Time    08/01/13  4:56 AM      Result Value Ref Range Status   C difficile by pcr NEGATIVE  NEGATIVE Final   Comment: Performed at [facility]  MRSA PCR SCREENING     Status: None   Collection Time    08/01/13  5:10 AM      Result Value Ref Range Status   MRSA by PCR NEGATIVE  NEGATIVE Final   Comment:            The GeneXpert MRSA Assay (FDA     approved for NASAL specimens     only), is one component of a     comprehensive MRSA colonization     surveillance program. It is not     intended to diagnose MRSA     infection nor to guide or     monitor treatment for     MRSA infections.     Studies: Ct Abdomen Pelvis Wo Contrast  08/01/2013   CLINICAL DATA Abdominal pain, leukocytosis.  EXAM CT ABDOMEN AND PELVIS WITHOUT CONTRAST  TECHNIQUE Multidetector CT imaging of the abdomen and pelvis was performed following the standard protocol without intravenous contrast.  COMPARISON None.  FINDINGS Lung bases are predominantly clear.  Moderate-sized hiatal hernia.  Organ evaluation limited without intravenous contrast. Within this limitation, central hypodensity within the liver on image 19 is favored to reflect a biliary cyst or hamartoma.  Contracted gallbladder. No radiodense gallstones. No biliary ductal dilatation.  No appreciable abnormality of the pancreas, spleen, adrenal glands.  Symmetric renal size. There are several nonobstructing stones within the lower pole on the right. No hydroureteronephrosis. Difficult to follow the ureters in their entirety given the decompressed state.  Decompressed  colon limits evaluation. Normal appendix. No bowel obstruction. Small bowel loops are of normal caliber. No perienteric fat stranding. Tiny fat containing umbilical hernia. Small duodenal diverticula. No free intraperitoneal air or fluid. No lymphadenopathy.  Scattered atherosclerotic  disease of the aorta and branch vessels without aneurysmal dilatation.  Thin walled bladder. Absent uterus. No adnexal mass. Pelvic floor laxity.  Gentle leftward curvature of the lumbar spine. No acute osseous finding.  IMPRESSION No acute abdominopelvic process identified by unenhanced CT.  Nonobstructing right renal stones. No perinephric fat stranding or hydroureteronephrosis.  SIGNATURE  Electronically Signed   By: Carlos Levering M.D.   On: 08/01/2013 03:38   Ct Head Wo Contrast  08/01/2013   CLINICAL DATA Altered mental status, medication overdose for 4 days.  EXAM CT HEAD WITHOUT CONTRAST  TECHNIQUE Contiguous axial images were obtained from the base of the skull through the vertex without intravenous contrast.  COMPARISON MR HEAD WO/W CM dated 10/10/2007  FINDINGS The ventricles and sulci are normal for age. No intraparenchymal hemorrhage, mass effect nor midline shift. Patchy supratentorial white matter hypodensities are within normal range for patient's age and though non-specific suggest sequelae of chronic small vessel ischemic disease. No acute large vascular territory infarcts.  No abnormal extra-axial fluid collections. Basal cisterns are patent. Minimal calcific atherosclerosis of the carotid siphons.  No skull fracture. Small bilateral maxillary sinus air-fluid levels. Soft Frothy secretions in the sphenoid sinuses. tissue density within the right mastoid air cells. . The included ocular globes and orbital contents are non-suspicious.  IMPRESSION No acute intracranial process.  Acute paranasal sinusitis with right mastoid effusion.  SIGNATURE  Electronically Signed   By: Elon Alas   On: 08/01/2013 03:22    Dg Chest Portable 1 View  07/31/2013   CLINICAL DATA Drug overdose.  EXAM PORTABLE CHEST - 1 VIEW  COMPARISON DG CHEST 2 VIEW dated 10/21/2011  FINDINGS Cardiomediastinal silhouette is unremarkable. The lungs are clear without pleural effusions or focal consolidations. Stable appearance the right midlung zone calcified granuloma. Trachea projects midline and there is no pneumothorax. Soft tissue planes and included osseous structures are non-suspicious. Multiple EKG lines overlie the patient and may obscure subtle underlying pathology.  IMPRESSION No acute cardiopulmonary process.  SIGNATURE  Electronically Signed   By: Elon Alas   On: 07/31/2013 23:58    Scheduled Meds: . cefTRIAXone (ROCEPHIN)  IV  1 g Intravenous Q0600  . enoxaparin (LOVENOX) injection  40 mg Subcutaneous Q24H  . metoprolol succinate  50 mg Oral Daily  . predniSONE  15 mg Oral Q breakfast  . sodium chloride  3 mL Intravenous Q12H   Continuous Infusions:    Principal Problem:   Acute encephalopathy Active Problems:   Polymyalgia rheumatica   Drug overdose   UTI (lower urinary tract infection)   HTN (hypertension)   Hyponatremia   Depression    Time spent: 30 min    Exilda Wilhite, [facility] Pager [phone]. If 7PM-7AM, please contact night-coverage at [email or web address], password [facility] 08/02/2013, 7:44 AM  LOS: 2 days

## 2013-08-03 DIAGNOSIS — G9340 Encephalopathy, unspecified: Secondary | ICD-10-CM | POA: Diagnosis not present

## 2013-08-03 DIAGNOSIS — D649 Anemia, unspecified: Secondary | ICD-10-CM

## 2013-08-03 DIAGNOSIS — N10 Acute tubulo-interstitial nephritis: Secondary | ICD-10-CM

## 2013-08-03 DIAGNOSIS — T50901A Poisoning by unspecified drugs, medicaments and biological substances, accidental (unintentional), initial encounter: Secondary | ICD-10-CM | POA: Diagnosis not present

## 2013-08-03 DIAGNOSIS — I10 Essential (primary) hypertension: Secondary | ICD-10-CM | POA: Diagnosis not present

## 2013-08-03 DIAGNOSIS — N390 Urinary tract infection, site not specified: Secondary | ICD-10-CM | POA: Diagnosis not present

## 2013-08-03 DIAGNOSIS — D72829 Elevated white blood cell count, unspecified: Secondary | ICD-10-CM

## 2013-08-03 LAB — BASIC METABOLIC PANEL
BUN: 5 mg/dL — AB (ref 6–23)
CHLORIDE: 101 meq/L (ref 96–112)
CO2: 25 meq/L (ref 19–32)
Calcium: 8.9 mg/dL (ref 8.4–10.5)
Creatinine, Ser: 0.59 mg/dL (ref 0.50–1.10)
GFR calc Af Amer: >90 mL/min (ref >90)
GFR calc non Af Amer: 89 mL/min — ABNORMAL LOW (ref >90)
Glucose, Bld: 86 mg/dL (ref 70–99)
Potassium: 3.3 mEq/L — ABNORMAL LOW (ref 3.7–5.3)
Sodium: 140 mEq/L (ref 137–147)

## 2013-08-03 LAB — CBC
HEMATOCRIT: 33.6 % — AB (ref 36.0–46.0)
Hemoglobin: 10.9 g/dL — ABNORMAL LOW (ref 12.0–15.0)
MCH: 32.2 pg (ref 26.0–34.0)
MCHC: 32.4 g/dL (ref 30.0–36.0)
MCV: 99.4 fL (ref 78.0–100.0)
Platelets: 423 10*3/uL — ABNORMAL HIGH (ref 150–400)
RBC: 3.38 MIL/uL — ABNORMAL LOW (ref 3.87–5.11)
RDW: 14.9 % (ref 11.5–15.5)
WBC: 11.8 10*3/uL — AB (ref 4.0–10.5)

## 2013-08-03 LAB — GLUCOSE, CAPILLARY
GLUCOSE-CAPILLARY: 98 mg/dL (ref 70–99)
GLUCOSE-CAPILLARY: 105 mg/dL — AB (ref 70–99)
GLUCOSE-CAPILLARY: 133 mg/dL — AB (ref 70–99)
Glucose-Capillary: 101 mg/dL — ABNORMAL HIGH (ref 70–99)

## 2013-08-03 MED ORDER — MELATONIN 3 MG PO CAPS: 3.0000 mg | ORAL_CAPSULE | Freq: Every evening | ORAL | Status: DC | PRN

## 2013-08-03 MED ORDER — ACETAMINOPHEN 325 MG PO TABS: 650.0000 mg | ORAL_TABLET | Freq: Four times a day (QID) | ORAL | Status: DC | PRN

## 2013-08-03 MED ORDER — PREDNISONE 5 MG PO TABS
5.0000 mg | ORAL_TABLET | Freq: Every day | ORAL | Status: DC
  Filled 2013-08-03: qty 1

## 2013-08-03 MED ORDER — CIPROFLOXACIN HCL 500 MG PO TABS
500.0000 mg | ORAL_TABLET | Freq: Two times a day (BID) | ORAL | Status: DC
  Filled 2013-08-03: qty 1

## 2013-08-03 MED ORDER — CIPROFLOXACIN HCL 500 MG PO TABS: 500.0000 mg | ORAL_TABLET | Freq: Two times a day (BID) | ORAL | Status: DC

## 2013-08-03 MED ORDER — POTASSIUM CHLORIDE CRYS ER 20 MEQ PO TBCR
40.0000 meq | EXTENDED_RELEASE_TABLET | Freq: Once | ORAL | Status: AC
  Administered 2013-08-03: 40 meq via ORAL
  Filled 2013-08-03: qty 2

## 2013-08-03 NOTE — Discharge Instructions (Signed)
Given to pt and daughter, Lemmie Evens.

## 2013-08-03 NOTE — Progress Notes (Signed)
Clinical Social Work  Patient has completed phone interview with [facility]. CSW met with patient at bedside who reports interview went well and family went to eat. CSW explained CSW will update patient once [facility] makes a decision about accepting patient. CSW updated RN on plans.  [place], [place] [phone]

## 2013-08-03 NOTE — Progress Notes (Signed)
Clinical Social Work  CSW spoke with Izora Gala at [facility] who reports patient can be accepted tomorrow. Izora Gala called dtr in room and spoke with her and patient re: DC plans. Patient will stay with dtr at a friend's house to ensure no access to any pills. Dtr will drive patient to [facility] tomorrow morning and has her assessment meeting at 1pm tomorrow. Patient and family aware of plans and thanked CSW for assistance. CSW updated RN and MD on DC plans.  CSW is signing off but available if needed.  [place], [place] [phone]
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
## 2013-08-03 NOTE — Discharge Summary (Addendum)
Physician Discharge Summary  [name] PPJ:[id number] DOB: [birth year] DOA: 07/31/2013  PCP: Mathews Argyle, MD  Admit date: 07/31/2013 Discharge date: 08/03/2013  Recommendations for Outpatient Follow-up:  1. Follow up with primary care doctor within 1 week of discharge for ongoing management of PMR, HTN.  Consider MMSE screening.  F/u pending blood cultures please.   2. Family to take home this evening and then transport to [facility] for ongoing drug rehab tomorrow morning.    Discharge Diagnoses:  Principal Problem:   Acute encephalopathy Active Problems:   Polymyalgia rheumatica   Drug overdose   UTI (lower urinary tract infection)   HTN (hypertension)   Hyponatremia   Depression   Acute pyelonephritis   Leukocytosis, unspecified   Normocytic anemia   Discharge Condition: stable, improved  Diet recommendation: healthy heart  Wt Readings from Last 3 Encounters:  08/03/13 65.4 kg (144 lb 2.9 oz)  01/12/13 68.493 kg (151 lb)  01/12/13 68.493 kg (151 lb)    History of present illness:  Eileen Jennings is a 73 y.o. 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 Patient has been confused for last 2-3 days and was taken to her primary care physician on March 9. Over there it was found that patient's Ambien which was filled on March 5 with 30 tablets 10 mg had only 5 tablets left. Patient also had filled Toradol on February 17 60 tablets of which only 14 were left. Patient does admit to have taken Ambien. Patient's husband states that she has been telling that she does not want to live with so much pain but presently denies any suicidal ideation. Patient has been having persistent nausea but has not thrown up and has epigastric pain. Denies any chest pain or shortness of breath. At the PCPs office since  patient has been having issues with sleep patient was instructed to give half dose of her regular Ambien of 10 mg and also was started on Celexa and Remeron both of which has not been taken yet. In the ER labs show mild hyponatremia and UA shows features consistent with UTI. Patient's daughter states that she has been having subjective feeling of fever and chills but in the ER patient was afebrile. Patient has fine tremors. Patient's daughter also states that she has had previous episodes where she had taken too much of her medications.   Hospital Course:   Acute encephalopathy likely secondary to dehydration, UTI and drug overdose with ambien and possibly tramadol.  She was started on IVF, ceftriaxone.  Her home psychiatric medications were discontinued and she was monitored on telemetry.  She was initially quite confused, but her mentation gradually improved.  Her CT head was negative, ammonia level 23, TSH 0.875, Tylenol < 15 and salicylate < 2.  Her family is concerned that the patient hides pills and has repeatedly overdosed on her sedative medications.  She was seen by psychiatry who recommended outpatient follow up and avoiding prescribing ambien.  Would add that she should probable have all of her medications supervised and she should not be prescribed addictive medications if possible.  Her family requested information about inpatient rehabilitation for drug abuse.    Nausea, vomiting, and diarrhea were likely secondary to drug overdose or withdrawal from her citalopram, mirtazapine.  She may also have had some gastroenteritis.  CT abd/pelvis was unremarkable.  Lipase, LFTs normal.  C. Diff negative.  She was given IVF and antiemetics and her symptoms resolved.  She was able to tolerate a regular diet prior to discharge.  Possible adrenal insufficiency.  She is on chronic steroids and given the severity of her initial illness, she was started on hydrocortisone stress dose on day 1.  On Day 2 and 3  her prednisone was given in increased dose 15mg  daily.  She may resume prednisone 5mg  daily at discharge.  Pyelonephritis UTI suggested by 21-50 WBC and many bacteria, large LE on UA with low grade fever and systemic symptoms such as nausea, vomiting, diarrhea.  Urine culture was not obtained prior to antibiotics.  She was started on ceftriaxone and has completed 3 days of therapy in the hospital.  She should continue ciprofloxacin for an additional 4 days to complete a 7-day course.    Fibromyalgia with depression and anxiety.  Her oxybutynin, citalopram, mirtazapine, ativan, and ambien were discontinued.  Recommend that she follow up with psychiatry and/or PCP to restart these medications if absolutely indicated.  Recommend minimizing medication list if possible.    PMR, continued steroids  HTN, blood pressures were elevated.  Continued metoprolol and added lisinopril.  She should follow up with her primary care doctor for further adjustments to her medications.    Hyponatremia and hypokalemia due to dehydration.  Resolved with IVF and potassium repletion.    Leukocytosis likely due to UTI, possible gastroenteritis or medication-related.  Trended down with IVF and stabilized near 11K.  May be chronically elevated due to steroid use.  Blood cultures NGTD.    Normocytic anemia, defer to PCP if work up not already complete. No need for blood transfusion as hemoglobin well above 7mg /dl.  Consultants:  Psychiatry Procedures:  CT head  CT abd/pelvis Antibiotics:  Ceftriaxone 3/11>> 3/13   Discharge Exam: Filed Vitals:   08/03/13 1200  BP:   Pulse:   Temp: 98.1 F (36.7 C)  Resp:    Filed Vitals:   08/03/13 0351 08/03/13 0800 08/03/13 1141 08/03/13 1200  BP: 140/60  153/61   Pulse: 83     Temp: 98.3 F (36.8 C) 97.6 F (36.4 C)  98.1 F (36.7 C)  TempSrc: Oral Oral  Oral  Resp: 14     Height:      Weight: 65.4 kg (144 lb 2.9 oz)     SpO2: 95%       General: CF, No acute  distress  HEENT: NCAT, MMM, no tongue fasciculations  Cardiovascular: RRR, nl S1, S2 no mrg, 2+ pulses, warm extremities  Respiratory: CTAB, no increased WOB  Abdomen: NABS, soft, NT/ND  MSK: Normal tone and bulk, no LEE  Neuro: Grossly moves all extremities, fine tremor  Psych: Oriented to person, place, and reason for being here   Discharge Instructions      Discharge Orders   Future Orders Complete By Expires   Call MD for:  difficulty breathing, headache or visual disturbances  As directed    Call MD for:  extreme fatigue  As directed    Call MD for:  hives  As directed    Call MD for:  persistant dizziness or light-headedness  As directed    Call MD for:  persistant nausea and vomiting  As directed    Call MD for:  severe uncontrolled pain  As directed    Call MD for:  temperature >100.4  As directed    Diet - low sodium heart healthy  As directed    Discharge instructions  As directed    Comments:     Ms. Emmerich was hospitalized with overdose of ambien and possibly tramadol.  Please find a psychiatrist that you trust to start counseling for depression and anxiety.  Agree with rehabilitation for substance abuse and dependence.  For urinary tract infection, she should continue ciprofloxacin for four more days to complete a 7-day course.  Follow up with primary care in 1 week for review of blood pressure, repeat blood work to check potassium and kidney function.   Increase activity slowly  As directed        Medication List    STOP taking these medications       citalopram 10 MG tablet  Commonly known as:  CELEXA     LORazepam 0.5 MG tablet  Commonly known as:  ATIVAN     mirtazapine 7.5 MG tablet  Commonly known as:  REMERON     ondansetron 4 MG disintegrating tablet  Commonly known as:  ZOFRAN-ODT     oxybutynin 10 MG 24 hr tablet  Commonly known as:  DITROPAN-XL     traMADol 50 MG tablet  Commonly known as:  ULTRAM     zolpidem 10 MG tablet  Commonly known  as:  AMBIEN      TAKE these medications       ciprofloxacin 500 MG tablet  Commonly known as:  CIPRO  Take 1 tablet (500 mg total) by mouth 2 (two) times daily.  Start taking on:  08/04/2013     estrogens (conjugated) 0.3 MG tablet  Commonly known as:  PREMARIN  Take 0.3 mg by mouth 2 (two) times daily.     lisinopril 5 MG tablet  Commonly known as:  PRINIVIL,ZESTRIL  Take 5 mg by mouth every morning.     Melatonin 3 MG Caps  Take 1 capsule (3 mg total) by mouth at bedtime as needed (insomnia).     metoprolol succinate 50 MG 24 hr tablet  Commonly known as:  TOPROL-XL  Take 50 mg by mouth every evening.     multivitamin with minerals Tabs tablet  Take 1 tablet by mouth every morning.     predniSONE 5 MG tablet  Commonly known as:  DELTASONE  Take 5 mg by mouth every morning.       Follow-up Information   Follow up with Mathews Argyle, MD. Schedule an appointment as soon as possible for a visit in 1 week.   Specialty:  Internal Medicine   Contact information:   [address] [place] [address] [phone]       Follow up with [facility]. (As needed)    Specialty:  Behavioral Health   Contact information:   [place] [address] [phone]       The results of significant diagnostics from this hospitalization (including imaging, microbiology, ancillary and laboratory) are listed below for reference.    Significant Diagnostic Studies: Ct Abdomen Pelvis Wo Contrast  08/01/2013   CLINICAL DATA Abdominal pain, leukocytosis.  EXAM CT ABDOMEN AND PELVIS WITHOUT CONTRAST  TECHNIQUE Multidetector CT imaging of the abdomen and pelvis was performed following the standard protocol without intravenous contrast.  COMPARISON None.  FINDINGS Lung bases are predominantly clear.  Moderate-sized hiatal hernia.  Organ evaluation limited without intravenous contrast. Within this limitation,  central hypodensity within the liver on image 19 is favored to reflect a biliary cyst or hamartoma.  Contracted gallbladder. No radiodense gallstones. No biliary ductal dilatation.  No appreciable abnormality of the pancreas, spleen, adrenal glands.  Symmetric renal size. There are several nonobstructing stones within the lower pole on the right. No hydroureteronephrosis. Difficult to follow the ureters in their entirety given the decompressed state.  Decompressed colon limits evaluation. Normal appendix. No bowel obstruction. Small bowel loops are of normal caliber. No perienteric fat stranding. Tiny fat containing umbilical hernia. Small duodenal diverticula. No free intraperitoneal air or fluid. No lymphadenopathy.  Scattered atherosclerotic disease of the aorta and branch vessels without aneurysmal dilatation.  Thin walled bladder. Absent uterus. No adnexal mass. Pelvic floor laxity.  Gentle leftward curvature of the lumbar spine. No acute osseous finding.  IMPRESSION No acute abdominopelvic process identified by unenhanced CT.  Nonobstructing right renal stones. No perinephric fat stranding or hydroureteronephrosis.  SIGNATURE  Electronically Signed   By: Carlos Levering M.D.   On: 08/01/2013 03:38   Ct Head Wo Contrast  08/01/2013   CLINICAL DATA Altered mental status, medication overdose for 4 days.  EXAM CT HEAD WITHOUT CONTRAST  TECHNIQUE Contiguous axial images were obtained from the base of the skull through the vertex without intravenous contrast.  COMPARISON MR HEAD WO/W CM dated 10/10/2007  FINDINGS The ventricles and sulci are normal for age. No intraparenchymal hemorrhage, mass effect nor midline shift. Patchy supratentorial white matter hypodensities are within normal range for patient's age and though non-specific suggest sequelae of chronic small vessel ischemic disease. No acute large vascular territory infarcts.  No abnormal extra-axial fluid collections. Basal cisterns are patent. Minimal  calcific atherosclerosis of the carotid siphons.  No skull fracture. Small bilateral maxillary sinus air-fluid levels. Soft Frothy secretions in the sphenoid sinuses. tissue density within the right mastoid air cells. . The included ocular globes and orbital contents are non-suspicious.  IMPRESSION No acute intracranial process.  Acute paranasal sinusitis with right mastoid effusion.  SIGNATURE  Electronically Signed   By: Elon Alas   On: 08/01/2013 03:22   Dg Chest Portable 1 View  07/31/2013   CLINICAL DATA Drug overdose.  EXAM PORTABLE CHEST - 1 VIEW  COMPARISON DG CHEST 2 VIEW dated 10/21/2011  FINDINGS Cardiomediastinal silhouette is unremarkable. The lungs are clear without pleural effusions or focal consolidations. Stable appearance the right midlung zone calcified granuloma. Trachea projects midline and there is no pneumothorax. Soft tissue planes and included osseous structures are non-suspicious. Multiple EKG lines overlie the patient and may obscure subtle underlying pathology.  IMPRESSION No acute cardiopulmonary process.  SIGNATURE  Electronically Signed   By: Elon Alas   On: 07/31/2013 23:58    Microbiology: Recent Results (from the past 240 hour(s))  CULTURE, BLOOD (ROUTINE X 2)     Status: None   Collection Time    08/01/13  2:02 AM      Result Value Ref Range Status   Specimen Description BLOOD LEFT ANTECUBITAL   Final   Special Requests BOTTLES DRAWN AEROBIC AND ANAEROBIC [facility]   Final   Culture  Setup Time     Final   Value: 08/01/2013 08:30     Performed at [facility]   Culture     Final   Value:        BLOOD CULTURE RECEIVED NO GROWTH TO DATE CULTURE WILL BE HELD FOR 5 DAYS BEFORE ISSUING A FINAL NEGATIVE REPORT     Performed at [facility]   Report Status PENDING   Incomplete  CULTURE, BLOOD (ROUTINE  X 2)     Status: None   Collection Time    08/01/13  2:08 AM      Result Value Ref Range Status   Specimen Description BLOOD LEFT FOREARM    Final   Special Requests BOTTLES DRAWN AEROBIC AND ANAEROBIC [facility]   Final   Culture  Setup Time     Final   Value: 08/01/2013 08:30     Performed at [facility]   Culture     Final   Value:        BLOOD CULTURE RECEIVED NO GROWTH TO DATE CULTURE WILL BE HELD FOR 5 DAYS BEFORE ISSUING A FINAL NEGATIVE REPORT     Performed at [facility]   Report Status PENDING   Incomplete  CLOSTRIDIUM DIFFICILE BY PCR     Status: None   Collection Time    08/01/13  4:56 AM      Result Value Ref Range Status   C difficile by pcr NEGATIVE  NEGATIVE Final   Comment: Performed at [facility]  MRSA PCR SCREENING     Status: None   Collection Time    08/01/13  5:10 AM      Result Value Ref Range Status   MRSA by PCR NEGATIVE  NEGATIVE Final   Comment:            The GeneXpert MRSA Assay (FDA     approved for NASAL specimens     only), is one component of a     comprehensive MRSA colonization     surveillance program. It is not     intended to diagnose MRSA     infection nor to guide or     monitor treatment for     MRSA infections.     Labs: Basic Metabolic Panel:  Recent Labs Lab 07/31/13 2359 08/01/13 0026 08/01/13 0515 08/02/13 0325 08/03/13 0315  NA 126* 129* 131* 139 140  K 5.5* 3.8 3.6* 4.2 3.3*  CL 93* 90* 93* 102 101  CO2  --  21 21 25 25   GLUCOSE 115* 116* 138* 118* 86  BUN 7 6 4* 2* 5*  CREATININE 0.80 0.65 0.60 0.66 0.59  CALCIUM  --  8.9 9.1 8.9 8.9   Liver Function Tests:  Recent Labs Lab 08/01/13 0026 08/01/13 0515  AST 20 19  ALT 11 11  ALKPHOS 41 38*  BILITOT 0.3 0.4  PROT 7.9 7.4  ALBUMIN 3.9 3.7    Recent Labs Lab 08/01/13 0515  LIPASE 39    Recent Labs Lab 08/01/13 0202  AMMONIA 23   CBC:  Recent Labs Lab 07/31/13 2348 07/31/13 2359 08/01/13 0515 08/02/13 0325 08/03/13 0315  WBC 14.5*  --  13.2* 10.7* 11.8*  NEUTROABS  --   --  11.5*  --   --   HGB 12.2 13.9 11.7* 11.3* 10.9*  HCT 35.5* 41.0 34.7* 34.4* 33.6*   MCV 95.9  --  96.1 98.3 99.4  PLT 478*  --  451* 422* 423*   Cardiac Enzymes:  Recent Labs Lab 08/01/13 0202  CKTOTAL 90  TROPONINI <0.30   BNP: BNP (last 3 results) No results found for this basename: PROBNP,  in the last 8760 hours CBG:  Recent Labs Lab 08/02/13 0625 08/02/13 1135 08/02/13 1807 08/02/13 2307 08/03/13 0552  GLUCAP 102* 98 182* 98 105*    Time coordinating discharge: 45 minutes  Signed:  Spruha Weight  [facility] 08/03/2013, 4:54 PM

## 2013-08-03 NOTE — Progress Notes (Signed)
[id number] Rosana Hoes, RN, BSN, CCM, [phone] Chart reviewed for update of needs and condition. Psych csw notified for the need and wanted of the family to obtain outpt rehab resources.

## 2013-08-06 DIAGNOSIS — R6889 Other general symptoms and signs: Secondary | ICD-10-CM | POA: Diagnosis not present

## 2013-08-07 DIAGNOSIS — E559 Vitamin D deficiency, unspecified: Secondary | ICD-10-CM | POA: Diagnosis not present

## 2013-08-07 DIAGNOSIS — R6889 Other general symptoms and signs: Secondary | ICD-10-CM | POA: Diagnosis not present

## 2013-08-07 DIAGNOSIS — R413 Other amnesia: Secondary | ICD-10-CM | POA: Diagnosis not present

## 2013-08-07 DIAGNOSIS — D449 Neoplasm of uncertain behavior of unspecified endocrine gland: Secondary | ICD-10-CM | POA: Diagnosis not present

## 2013-08-07 LAB — CULTURE, BLOOD (ROUTINE X 2)
CULTURE: NO GROWTH
Culture: NO GROWTH

## 2013-09-18 DIAGNOSIS — F339 Major depressive disorder, recurrent, unspecified: Secondary | ICD-10-CM | POA: Diagnosis not present

## 2013-09-18 DIAGNOSIS — G479 Sleep disorder, unspecified: Secondary | ICD-10-CM | POA: Diagnosis not present

## 2013-09-18 DIAGNOSIS — I10 Essential (primary) hypertension: Secondary | ICD-10-CM | POA: Diagnosis not present

## 2013-10-22 DIAGNOSIS — M353 Polymyalgia rheumatica: Secondary | ICD-10-CM | POA: Diagnosis not present

## 2013-10-22 DIAGNOSIS — H908 Mixed conductive and sensorineural hearing loss, unspecified: Secondary | ICD-10-CM | POA: Diagnosis not present

## 2013-10-22 DIAGNOSIS — H6980 Other specified disorders of Eustachian tube, unspecified ear: Secondary | ICD-10-CM | POA: Diagnosis not present

## 2013-10-22 DIAGNOSIS — H9190 Unspecified hearing loss, unspecified ear: Secondary | ICD-10-CM | POA: Diagnosis not present

## 2013-10-22 DIAGNOSIS — H905 Unspecified sensorineural hearing loss: Secondary | ICD-10-CM | POA: Diagnosis not present

## 2013-10-22 DIAGNOSIS — M810 Age-related osteoporosis without current pathological fracture: Secondary | ICD-10-CM | POA: Diagnosis not present

## 2013-10-22 DIAGNOSIS — H6520 Chronic serous otitis media, unspecified ear: Secondary | ICD-10-CM | POA: Diagnosis not present

## 2013-10-22 DIAGNOSIS — M159 Polyosteoarthritis, unspecified: Secondary | ICD-10-CM | POA: Diagnosis not present

## 2013-10-31 DIAGNOSIS — R82998 Other abnormal findings in urine: Secondary | ICD-10-CM | POA: Diagnosis not present

## 2013-10-31 DIAGNOSIS — T83721A Exposure of implanted vaginal mesh and other prosthetic materials into vagina, initial encounter: Secondary | ICD-10-CM | POA: Diagnosis not present

## 2013-10-31 DIAGNOSIS — Z0000 Encounter for general adult medical examination without abnormal findings: Secondary | ICD-10-CM | POA: Diagnosis not present

## 2013-10-31 DIAGNOSIS — Z01419 Encounter for gynecological examination (general) (routine) without abnormal findings: Secondary | ICD-10-CM | POA: Diagnosis not present

## 2013-11-20 DIAGNOSIS — I10 Essential (primary) hypertension: Secondary | ICD-10-CM | POA: Diagnosis not present

## 2013-11-20 DIAGNOSIS — F411 Generalized anxiety disorder: Secondary | ICD-10-CM | POA: Diagnosis not present

## 2013-11-20 DIAGNOSIS — F339 Major depressive disorder, recurrent, unspecified: Secondary | ICD-10-CM | POA: Diagnosis not present

## 2013-12-13 DIAGNOSIS — H2510 Age-related nuclear cataract, unspecified eye: Secondary | ICD-10-CM | POA: Diagnosis not present

## 2013-12-13 DIAGNOSIS — H40019 Open angle with borderline findings, low risk, unspecified eye: Secondary | ICD-10-CM | POA: Diagnosis not present

## 2013-12-13 DIAGNOSIS — H04129 Dry eye syndrome of unspecified lacrimal gland: Secondary | ICD-10-CM | POA: Diagnosis not present

## 2014-01-04 DIAGNOSIS — E669 Obesity, unspecified: Secondary | ICD-10-CM | POA: Diagnosis not present

## 2014-01-04 DIAGNOSIS — Z6829 Body mass index (BMI) 29.0-29.9, adult: Secondary | ICD-10-CM | POA: Diagnosis not present

## 2014-01-04 DIAGNOSIS — F411 Generalized anxiety disorder: Secondary | ICD-10-CM | POA: Diagnosis not present

## 2014-01-04 DIAGNOSIS — I10 Essential (primary) hypertension: Secondary | ICD-10-CM | POA: Diagnosis not present

## 2014-01-22 DIAGNOSIS — M810 Age-related osteoporosis without current pathological fracture: Secondary | ICD-10-CM | POA: Diagnosis not present

## 2014-01-22 DIAGNOSIS — M353 Polymyalgia rheumatica: Secondary | ICD-10-CM | POA: Diagnosis not present

## 2014-01-22 DIAGNOSIS — H908 Mixed conductive and sensorineural hearing loss, unspecified: Secondary | ICD-10-CM | POA: Diagnosis not present

## 2014-01-22 DIAGNOSIS — M159 Polyosteoarthritis, unspecified: Secondary | ICD-10-CM | POA: Diagnosis not present

## 2014-01-22 DIAGNOSIS — H6980 Other specified disorders of Eustachian tube, unspecified ear: Secondary | ICD-10-CM | POA: Diagnosis not present

## 2014-01-22 DIAGNOSIS — J301 Allergic rhinitis due to pollen: Secondary | ICD-10-CM | POA: Diagnosis not present

## 2014-01-22 DIAGNOSIS — H6520 Chronic serous otitis media, unspecified ear: Secondary | ICD-10-CM | POA: Diagnosis not present

## 2014-02-11 ENCOUNTER — Other Ambulatory Visit: Payer: Self-pay

## 2014-02-11 DIAGNOSIS — Z1231 Encounter for screening mammogram for malignant neoplasm of breast: Secondary | ICD-10-CM

## 2014-03-12 DIAGNOSIS — Z23 Encounter for immunization: Secondary | ICD-10-CM | POA: Diagnosis not present

## 2014-03-18 ENCOUNTER — Ambulatory Visit
Admission: RE | Admit: 2014-03-18 | Discharge: 2014-03-18 | Disposition: A | Discharge status: Home / Self Care | Payer: Medicare Other | Source: Ambulatory Visit

## 2014-03-18 DIAGNOSIS — Z1231 Encounter for screening mammogram for malignant neoplasm of breast: Secondary | ICD-10-CM | POA: Diagnosis not present

## 2014-04-12 DIAGNOSIS — H6520 Chronic serous otitis media, unspecified ear: Secondary | ICD-10-CM | POA: Diagnosis not present

## 2014-04-12 DIAGNOSIS — J301 Allergic rhinitis due to pollen: Secondary | ICD-10-CM | POA: Diagnosis not present

## 2014-04-12 DIAGNOSIS — H6980 Other specified disorders of Eustachian tube, unspecified ear: Secondary | ICD-10-CM | POA: Diagnosis not present

## 2014-05-27 DIAGNOSIS — M150 Primary generalized (osteo)arthritis: Secondary | ICD-10-CM | POA: Diagnosis not present

## 2014-05-27 DIAGNOSIS — M810 Age-related osteoporosis without current pathological fracture: Secondary | ICD-10-CM | POA: Diagnosis not present

## 2014-05-27 DIAGNOSIS — M353 Polymyalgia rheumatica: Secondary | ICD-10-CM | POA: Diagnosis not present

## 2014-05-31 DIAGNOSIS — F419 Anxiety disorder, unspecified: Secondary | ICD-10-CM | POA: Diagnosis not present

## 2014-05-31 DIAGNOSIS — F325 Major depressive disorder, single episode, in full remission: Secondary | ICD-10-CM | POA: Diagnosis not present

## 2014-05-31 DIAGNOSIS — Z79899 Other long term (current) drug therapy: Secondary | ICD-10-CM | POA: Diagnosis not present

## 2014-05-31 DIAGNOSIS — M353 Polymyalgia rheumatica: Secondary | ICD-10-CM | POA: Diagnosis not present

## 2014-05-31 DIAGNOSIS — I10 Essential (primary) hypertension: Secondary | ICD-10-CM | POA: Diagnosis not present

## 2014-05-31 DIAGNOSIS — M810 Age-related osteoporosis without current pathological fracture: Secondary | ICD-10-CM | POA: Diagnosis not present

## 2014-05-31 DIAGNOSIS — E780 Pure hypercholesterolemia: Secondary | ICD-10-CM | POA: Diagnosis not present

## 2014-05-31 DIAGNOSIS — Z0000 Encounter for general adult medical examination without abnormal findings: Secondary | ICD-10-CM | POA: Diagnosis not present

## 2014-08-30 DIAGNOSIS — M150 Primary generalized (osteo)arthritis: Secondary | ICD-10-CM | POA: Diagnosis not present

## 2014-08-30 DIAGNOSIS — M810 Age-related osteoporosis without current pathological fracture: Secondary | ICD-10-CM | POA: Diagnosis not present

## 2014-08-30 DIAGNOSIS — M353 Polymyalgia rheumatica: Secondary | ICD-10-CM | POA: Diagnosis not present

## 2014-11-12 DIAGNOSIS — Z1389 Encounter for screening for other disorder: Secondary | ICD-10-CM | POA: Diagnosis not present

## 2014-11-12 DIAGNOSIS — Z87440 Personal history of urinary (tract) infections: Secondary | ICD-10-CM | POA: Diagnosis not present

## 2014-11-12 DIAGNOSIS — Z130 Encounter for screening for diseases of the blood and blood-forming organs and certain disorders involving the immune mechanism: Secondary | ICD-10-CM | POA: Diagnosis not present

## 2014-11-12 DIAGNOSIS — Z01419 Encounter for gynecological examination (general) (routine) without abnormal findings: Secondary | ICD-10-CM | POA: Diagnosis not present

## 2014-11-12 DIAGNOSIS — T83711D Erosion of implanted vaginal mesh and other prosthetic materials to surrounding organ or tissue, subsequent encounter: Secondary | ICD-10-CM | POA: Diagnosis not present

## 2014-11-12 DIAGNOSIS — Z6830 Body mass index (BMI) 30.0-30.9, adult: Secondary | ICD-10-CM | POA: Diagnosis not present

## 2014-11-12 DIAGNOSIS — R8290 Unspecified abnormal findings in urine: Secondary | ICD-10-CM | POA: Diagnosis not present

## 2014-11-12 DIAGNOSIS — Z79890 Hormone replacement therapy (postmenopausal): Secondary | ICD-10-CM | POA: Diagnosis not present

## 2014-11-29 DIAGNOSIS — M353 Polymyalgia rheumatica: Secondary | ICD-10-CM | POA: Diagnosis not present

## 2014-11-29 DIAGNOSIS — E669 Obesity, unspecified: Secondary | ICD-10-CM | POA: Diagnosis not present

## 2014-11-29 DIAGNOSIS — F325 Major depressive disorder, single episode, in full remission: Secondary | ICD-10-CM | POA: Diagnosis not present

## 2014-11-29 DIAGNOSIS — I10 Essential (primary) hypertension: Secondary | ICD-10-CM | POA: Diagnosis not present

## 2014-11-29 DIAGNOSIS — Z6832 Body mass index (BMI) 32.0-32.9, adult: Secondary | ICD-10-CM | POA: Diagnosis not present

## 2014-11-29 DIAGNOSIS — Z79899 Other long term (current) drug therapy: Secondary | ICD-10-CM | POA: Diagnosis not present

## 2014-12-30 DIAGNOSIS — M1711 Unilateral primary osteoarthritis, right knee: Secondary | ICD-10-CM | POA: Diagnosis not present

## 2014-12-30 DIAGNOSIS — Z471 Aftercare following joint replacement surgery: Secondary | ICD-10-CM | POA: Diagnosis not present

## 2014-12-30 DIAGNOSIS — Z96652 Presence of left artificial knee joint: Secondary | ICD-10-CM | POA: Diagnosis not present

## 2015-01-02 DIAGNOSIS — M150 Primary generalized (osteo)arthritis: Secondary | ICD-10-CM | POA: Diagnosis not present

## 2015-01-02 DIAGNOSIS — M353 Polymyalgia rheumatica: Secondary | ICD-10-CM | POA: Diagnosis not present

## 2015-01-02 DIAGNOSIS — Z7952 Long term (current) use of systemic steroids: Secondary | ICD-10-CM | POA: Diagnosis not present

## 2015-01-31 DIAGNOSIS — M1711 Unilateral primary osteoarthritis, right knee: Secondary | ICD-10-CM | POA: Diagnosis not present

## 2015-02-05 DIAGNOSIS — M1711 Unilateral primary osteoarthritis, right knee: Secondary | ICD-10-CM | POA: Diagnosis not present

## 2015-02-10 DIAGNOSIS — J0100 Acute maxillary sinusitis, unspecified: Secondary | ICD-10-CM | POA: Diagnosis not present

## 2015-02-10 DIAGNOSIS — J40 Bronchitis, not specified as acute or chronic: Secondary | ICD-10-CM | POA: Diagnosis not present

## 2015-02-14 DIAGNOSIS — M1711 Unilateral primary osteoarthritis, right knee: Secondary | ICD-10-CM | POA: Diagnosis not present

## 2015-02-17 ENCOUNTER — Other Ambulatory Visit: Payer: Self-pay

## 2015-02-17 DIAGNOSIS — Z1231 Encounter for screening mammogram for malignant neoplasm of breast: Secondary | ICD-10-CM

## 2015-02-26 DIAGNOSIS — Z23 Encounter for immunization: Secondary | ICD-10-CM | POA: Diagnosis not present

## 2015-03-28 DIAGNOSIS — M1711 Unilateral primary osteoarthritis, right knee: Secondary | ICD-10-CM | POA: Diagnosis not present

## 2015-04-01 DIAGNOSIS — H04123 Dry eye syndrome of bilateral lacrimal glands: Secondary | ICD-10-CM | POA: Diagnosis not present

## 2015-04-01 DIAGNOSIS — H2513 Age-related nuclear cataract, bilateral: Secondary | ICD-10-CM | POA: Diagnosis not present

## 2015-04-01 DIAGNOSIS — H40013 Open angle with borderline findings, low risk, bilateral: Secondary | ICD-10-CM | POA: Diagnosis not present

## 2015-04-14 ENCOUNTER — Ambulatory Visit
Admission: RE | Admit: 2015-04-14 | Discharge: 2015-04-14 | Disposition: A | Discharge status: Home / Self Care | Payer: Medicare Other | Source: Ambulatory Visit

## 2015-04-14 DIAGNOSIS — Z1231 Encounter for screening mammogram for malignant neoplasm of breast: Secondary | ICD-10-CM | POA: Diagnosis not present

## 2015-04-16 DIAGNOSIS — M353 Polymyalgia rheumatica: Secondary | ICD-10-CM | POA: Diagnosis not present

## 2015-04-16 DIAGNOSIS — M810 Age-related osteoporosis without current pathological fracture: Secondary | ICD-10-CM | POA: Diagnosis not present

## 2015-04-16 DIAGNOSIS — M150 Primary generalized (osteo)arthritis: Secondary | ICD-10-CM | POA: Diagnosis not present

## 2015-04-16 DIAGNOSIS — Z7952 Long term (current) use of systemic steroids: Secondary | ICD-10-CM | POA: Diagnosis not present

## 2015-04-25 DIAGNOSIS — M810 Age-related osteoporosis without current pathological fracture: Secondary | ICD-10-CM | POA: Diagnosis not present

## 2015-07-07 DIAGNOSIS — Z0000 Encounter for general adult medical examination without abnormal findings: Secondary | ICD-10-CM | POA: Diagnosis not present

## 2015-07-07 DIAGNOSIS — E669 Obesity, unspecified: Secondary | ICD-10-CM | POA: Diagnosis not present

## 2015-07-07 DIAGNOSIS — F325 Major depressive disorder, single episode, in full remission: Secondary | ICD-10-CM | POA: Diagnosis not present

## 2015-07-07 DIAGNOSIS — Z1389 Encounter for screening for other disorder: Secondary | ICD-10-CM | POA: Diagnosis not present

## 2015-07-07 DIAGNOSIS — M353 Polymyalgia rheumatica: Secondary | ICD-10-CM | POA: Diagnosis not present

## 2015-07-07 DIAGNOSIS — Z6831 Body mass index (BMI) 31.0-31.9, adult: Secondary | ICD-10-CM | POA: Diagnosis not present

## 2015-07-07 DIAGNOSIS — E7800 Pure hypercholesterolemia, unspecified: Secondary | ICD-10-CM | POA: Diagnosis not present

## 2015-07-07 DIAGNOSIS — Z79899 Other long term (current) drug therapy: Secondary | ICD-10-CM | POA: Diagnosis not present

## 2015-07-07 DIAGNOSIS — M810 Age-related osteoporosis without current pathological fracture: Secondary | ICD-10-CM | POA: Diagnosis not present

## 2015-07-07 DIAGNOSIS — I10 Essential (primary) hypertension: Secondary | ICD-10-CM | POA: Diagnosis not present

## 2015-07-07 DIAGNOSIS — D509 Iron deficiency anemia, unspecified: Secondary | ICD-10-CM | POA: Diagnosis not present

## 2015-07-17 DIAGNOSIS — M353 Polymyalgia rheumatica: Secondary | ICD-10-CM | POA: Diagnosis not present

## 2015-07-17 DIAGNOSIS — M150 Primary generalized (osteo)arthritis: Secondary | ICD-10-CM | POA: Diagnosis not present

## 2015-07-17 DIAGNOSIS — M810 Age-related osteoporosis without current pathological fracture: Secondary | ICD-10-CM | POA: Diagnosis not present

## 2015-07-17 DIAGNOSIS — Z7952 Long term (current) use of systemic steroids: Secondary | ICD-10-CM | POA: Diagnosis not present

## 2015-08-05 DIAGNOSIS — D509 Iron deficiency anemia, unspecified: Secondary | ICD-10-CM | POA: Diagnosis not present

## 2015-08-20 DIAGNOSIS — M1711 Unilateral primary osteoarthritis, right knee: Secondary | ICD-10-CM | POA: Diagnosis not present

## 2015-08-27 DIAGNOSIS — M1711 Unilateral primary osteoarthritis, right knee: Secondary | ICD-10-CM | POA: Diagnosis not present

## 2015-09-03 DIAGNOSIS — M1711 Unilateral primary osteoarthritis, right knee: Secondary | ICD-10-CM | POA: Diagnosis not present

## 2015-10-14 DIAGNOSIS — M353 Polymyalgia rheumatica: Secondary | ICD-10-CM | POA: Diagnosis not present

## 2015-10-14 DIAGNOSIS — M810 Age-related osteoporosis without current pathological fracture: Secondary | ICD-10-CM | POA: Diagnosis not present

## 2015-10-14 DIAGNOSIS — M150 Primary generalized (osteo)arthritis: Secondary | ICD-10-CM | POA: Diagnosis not present

## 2015-10-14 DIAGNOSIS — Z7952 Long term (current) use of systemic steroids: Secondary | ICD-10-CM | POA: Diagnosis not present

## 2015-10-29 DIAGNOSIS — H6123 Impacted cerumen, bilateral: Secondary | ICD-10-CM | POA: Diagnosis not present

## 2015-10-29 DIAGNOSIS — H9193 Unspecified hearing loss, bilateral: Secondary | ICD-10-CM | POA: Diagnosis not present

## 2015-10-29 DIAGNOSIS — H6983 Other specified disorders of Eustachian tube, bilateral: Secondary | ICD-10-CM | POA: Diagnosis not present

## 2015-10-29 DIAGNOSIS — J302 Other seasonal allergic rhinitis: Secondary | ICD-10-CM | POA: Diagnosis not present

## 2015-12-09 DIAGNOSIS — M1711 Unilateral primary osteoarthritis, right knee: Secondary | ICD-10-CM | POA: Diagnosis not present

## 2015-12-11 DIAGNOSIS — Z01419 Encounter for gynecological examination (general) (routine) without abnormal findings: Secondary | ICD-10-CM | POA: Diagnosis not present

## 2015-12-11 DIAGNOSIS — N959 Unspecified menopausal and perimenopausal disorder: Secondary | ICD-10-CM | POA: Diagnosis not present

## 2015-12-11 DIAGNOSIS — Z1389 Encounter for screening for other disorder: Secondary | ICD-10-CM | POA: Diagnosis not present

## 2015-12-11 DIAGNOSIS — Z6828 Body mass index (BMI) 28.0-28.9, adult: Secondary | ICD-10-CM | POA: Diagnosis not present

## 2016-01-05 DIAGNOSIS — M353 Polymyalgia rheumatica: Secondary | ICD-10-CM | POA: Diagnosis not present

## 2016-01-05 DIAGNOSIS — I10 Essential (primary) hypertension: Secondary | ICD-10-CM | POA: Diagnosis not present

## 2016-01-05 DIAGNOSIS — Z79899 Other long term (current) drug therapy: Secondary | ICD-10-CM | POA: Diagnosis not present

## 2016-01-09 ENCOUNTER — Other Ambulatory Visit: Payer: Self-pay | Admitting: Geriatric Medicine

## 2016-01-09 DIAGNOSIS — Z1231 Encounter for screening mammogram for malignant neoplasm of breast: Secondary | ICD-10-CM

## 2016-02-09 DIAGNOSIS — Z23 Encounter for immunization: Secondary | ICD-10-CM | POA: Diagnosis not present

## 2016-03-05 DIAGNOSIS — M1711 Unilateral primary osteoarthritis, right knee: Secondary | ICD-10-CM | POA: Diagnosis not present

## 2016-03-12 DIAGNOSIS — M1711 Unilateral primary osteoarthritis, right knee: Secondary | ICD-10-CM | POA: Diagnosis not present

## 2016-03-19 DIAGNOSIS — M1711 Unilateral primary osteoarthritis, right knee: Secondary | ICD-10-CM | POA: Diagnosis not present

## 2016-04-13 DIAGNOSIS — Z7952 Long term (current) use of systemic steroids: Secondary | ICD-10-CM | POA: Diagnosis not present

## 2016-04-13 DIAGNOSIS — M353 Polymyalgia rheumatica: Secondary | ICD-10-CM | POA: Diagnosis not present

## 2016-04-13 DIAGNOSIS — M150 Primary generalized (osteo)arthritis: Secondary | ICD-10-CM | POA: Diagnosis not present

## 2016-04-13 DIAGNOSIS — M810 Age-related osteoporosis without current pathological fracture: Secondary | ICD-10-CM | POA: Diagnosis not present

## 2016-04-21 ENCOUNTER — Ambulatory Visit
Admission: RE | Admit: 2016-04-21 | Discharge: 2016-04-21 | Disposition: A | Discharge status: Home / Self Care | Payer: Medicare Other | Source: Ambulatory Visit | Attending: Geriatric Medicine | Admitting: Geriatric Medicine

## 2016-04-21 DIAGNOSIS — Z1231 Encounter for screening mammogram for malignant neoplasm of breast: Secondary | ICD-10-CM

## 2016-04-27 DIAGNOSIS — M810 Age-related osteoporosis without current pathological fracture: Secondary | ICD-10-CM | POA: Diagnosis not present

## 2016-05-25 DIAGNOSIS — R55 Syncope and collapse: Secondary | ICD-10-CM | POA: Diagnosis not present

## 2016-05-25 DIAGNOSIS — R404 Transient alteration of awareness: Secondary | ICD-10-CM | POA: Diagnosis not present

## 2016-06-23 DIAGNOSIS — H2513 Age-related nuclear cataract, bilateral: Secondary | ICD-10-CM | POA: Diagnosis not present

## 2016-06-23 DIAGNOSIS — H43393 Other vitreous opacities, bilateral: Secondary | ICD-10-CM | POA: Diagnosis not present

## 2016-06-23 DIAGNOSIS — H40013 Open angle with borderline findings, low risk, bilateral: Secondary | ICD-10-CM | POA: Diagnosis not present

## 2016-06-23 DIAGNOSIS — H04123 Dry eye syndrome of bilateral lacrimal glands: Secondary | ICD-10-CM | POA: Diagnosis not present

## 2016-07-09 DIAGNOSIS — Z1389 Encounter for screening for other disorder: Secondary | ICD-10-CM | POA: Diagnosis not present

## 2016-07-09 DIAGNOSIS — Z0000 Encounter for general adult medical examination without abnormal findings: Secondary | ICD-10-CM | POA: Diagnosis not present

## 2016-07-09 DIAGNOSIS — I10 Essential (primary) hypertension: Secondary | ICD-10-CM | POA: Diagnosis not present

## 2016-07-09 DIAGNOSIS — F325 Major depressive disorder, single episode, in full remission: Secondary | ICD-10-CM | POA: Diagnosis not present

## 2016-07-09 DIAGNOSIS — Z7189 Other specified counseling: Secondary | ICD-10-CM | POA: Diagnosis not present

## 2016-07-09 DIAGNOSIS — Z79899 Other long term (current) drug therapy: Secondary | ICD-10-CM | POA: Diagnosis not present

## 2016-07-09 DIAGNOSIS — M353 Polymyalgia rheumatica: Secondary | ICD-10-CM | POA: Diagnosis not present

## 2016-07-09 DIAGNOSIS — E7800 Pure hypercholesterolemia, unspecified: Secondary | ICD-10-CM | POA: Diagnosis not present

## 2016-07-14 DIAGNOSIS — M810 Age-related osteoporosis without current pathological fracture: Secondary | ICD-10-CM | POA: Diagnosis not present

## 2016-07-14 DIAGNOSIS — Z6829 Body mass index (BMI) 29.0-29.9, adult: Secondary | ICD-10-CM | POA: Diagnosis not present

## 2016-07-14 DIAGNOSIS — Z7952 Long term (current) use of systemic steroids: Secondary | ICD-10-CM | POA: Diagnosis not present

## 2016-07-14 DIAGNOSIS — M353 Polymyalgia rheumatica: Secondary | ICD-10-CM | POA: Diagnosis not present

## 2016-07-14 DIAGNOSIS — E663 Overweight: Secondary | ICD-10-CM | POA: Diagnosis not present

## 2016-07-14 DIAGNOSIS — M150 Primary generalized (osteo)arthritis: Secondary | ICD-10-CM | POA: Diagnosis not present

## 2016-09-23 DIAGNOSIS — M1711 Unilateral primary osteoarthritis, right knee: Secondary | ICD-10-CM | POA: Diagnosis not present

## 2016-09-29 DIAGNOSIS — M1711 Unilateral primary osteoarthritis, right knee: Secondary | ICD-10-CM | POA: Diagnosis not present

## 2016-10-06 DIAGNOSIS — M1711 Unilateral primary osteoarthritis, right knee: Secondary | ICD-10-CM | POA: Diagnosis not present

## 2016-10-11 DIAGNOSIS — M79641 Pain in right hand: Secondary | ICD-10-CM | POA: Diagnosis not present

## 2016-10-11 DIAGNOSIS — M353 Polymyalgia rheumatica: Secondary | ICD-10-CM | POA: Diagnosis not present

## 2016-10-11 DIAGNOSIS — Z7952 Long term (current) use of systemic steroids: Secondary | ICD-10-CM | POA: Diagnosis not present

## 2016-10-11 DIAGNOSIS — M150 Primary generalized (osteo)arthritis: Secondary | ICD-10-CM | POA: Diagnosis not present

## 2016-10-11 DIAGNOSIS — Z6829 Body mass index (BMI) 29.0-29.9, adult: Secondary | ICD-10-CM | POA: Diagnosis not present

## 2016-10-11 DIAGNOSIS — M810 Age-related osteoporosis without current pathological fracture: Secondary | ICD-10-CM | POA: Diagnosis not present

## 2016-10-11 DIAGNOSIS — E663 Overweight: Secondary | ICD-10-CM | POA: Diagnosis not present

## 2016-10-11 DIAGNOSIS — M79642 Pain in left hand: Secondary | ICD-10-CM | POA: Diagnosis not present

## 2016-10-11 DIAGNOSIS — M797 Fibromyalgia: Secondary | ICD-10-CM | POA: Diagnosis not present

## 2016-10-27 DIAGNOSIS — D509 Iron deficiency anemia, unspecified: Secondary | ICD-10-CM | POA: Diagnosis not present

## 2016-10-27 DIAGNOSIS — M810 Age-related osteoporosis without current pathological fracture: Secondary | ICD-10-CM | POA: Diagnosis not present

## 2016-10-27 DIAGNOSIS — I10 Essential (primary) hypertension: Secondary | ICD-10-CM | POA: Diagnosis not present

## 2016-10-27 DIAGNOSIS — F325 Major depressive disorder, single episode, in full remission: Secondary | ICD-10-CM | POA: Diagnosis not present

## 2016-12-28 DIAGNOSIS — Z1231 Encounter for screening mammogram for malignant neoplasm of breast: Secondary | ICD-10-CM | POA: Diagnosis not present

## 2016-12-28 DIAGNOSIS — Z1389 Encounter for screening for other disorder: Secondary | ICD-10-CM | POA: Diagnosis not present

## 2016-12-28 DIAGNOSIS — R8290 Unspecified abnormal findings in urine: Secondary | ICD-10-CM | POA: Diagnosis not present

## 2016-12-28 DIAGNOSIS — N959 Unspecified menopausal and perimenopausal disorder: Secondary | ICD-10-CM | POA: Diagnosis not present

## 2016-12-29 DIAGNOSIS — R8290 Unspecified abnormal findings in urine: Secondary | ICD-10-CM | POA: Diagnosis not present

## 2017-01-07 DIAGNOSIS — E669 Obesity, unspecified: Secondary | ICD-10-CM | POA: Diagnosis not present

## 2017-01-07 DIAGNOSIS — M25561 Pain in right knee: Secondary | ICD-10-CM | POA: Diagnosis not present

## 2017-01-07 DIAGNOSIS — I10 Essential (primary) hypertension: Secondary | ICD-10-CM | POA: Diagnosis not present

## 2017-01-07 DIAGNOSIS — Z79899 Other long term (current) drug therapy: Secondary | ICD-10-CM | POA: Diagnosis not present

## 2017-01-07 DIAGNOSIS — Z6830 Body mass index (BMI) 30.0-30.9, adult: Secondary | ICD-10-CM | POA: Diagnosis not present

## 2017-02-11 DIAGNOSIS — M810 Age-related osteoporosis without current pathological fracture: Secondary | ICD-10-CM | POA: Diagnosis not present

## 2017-02-11 DIAGNOSIS — E663 Overweight: Secondary | ICD-10-CM | POA: Diagnosis not present

## 2017-02-11 DIAGNOSIS — Z6828 Body mass index (BMI) 28.0-28.9, adult: Secondary | ICD-10-CM | POA: Diagnosis not present

## 2017-02-11 DIAGNOSIS — M150 Primary generalized (osteo)arthritis: Secondary | ICD-10-CM | POA: Diagnosis not present

## 2017-02-11 DIAGNOSIS — M797 Fibromyalgia: Secondary | ICD-10-CM | POA: Diagnosis not present

## 2017-02-11 DIAGNOSIS — M353 Polymyalgia rheumatica: Secondary | ICD-10-CM | POA: Diagnosis not present

## 2017-02-11 DIAGNOSIS — Z7952 Long term (current) use of systemic steroids: Secondary | ICD-10-CM | POA: Diagnosis not present

## 2017-02-16 DIAGNOSIS — M1711 Unilateral primary osteoarthritis, right knee: Secondary | ICD-10-CM | POA: Diagnosis not present

## 2017-03-10 ENCOUNTER — Other Ambulatory Visit: Payer: Self-pay | Admitting: Geriatric Medicine

## 2017-03-10 DIAGNOSIS — Z1231 Encounter for screening mammogram for malignant neoplasm of breast: Secondary | ICD-10-CM

## 2017-03-11 DIAGNOSIS — Z23 Encounter for immunization: Secondary | ICD-10-CM | POA: Diagnosis not present

## 2017-04-22 ENCOUNTER — Ambulatory Visit
Admission: RE | Admit: 2017-04-22 | Discharge: 2017-04-22 | Disposition: A | Discharge status: Home / Self Care | Payer: Medicare Other | Source: Ambulatory Visit | Attending: Geriatric Medicine | Admitting: Geriatric Medicine

## 2017-04-22 DIAGNOSIS — Z1231 Encounter for screening mammogram for malignant neoplasm of breast: Secondary | ICD-10-CM | POA: Diagnosis not present

## 2017-05-09 DIAGNOSIS — M810 Age-related osteoporosis without current pathological fracture: Secondary | ICD-10-CM | POA: Diagnosis not present

## 2017-05-25 DIAGNOSIS — M810 Age-related osteoporosis without current pathological fracture: Secondary | ICD-10-CM | POA: Diagnosis not present

## 2017-07-13 DIAGNOSIS — I10 Essential (primary) hypertension: Secondary | ICD-10-CM | POA: Diagnosis not present

## 2017-07-13 DIAGNOSIS — D539 Nutritional anemia, unspecified: Secondary | ICD-10-CM | POA: Diagnosis not present

## 2017-07-13 DIAGNOSIS — Z79899 Other long term (current) drug therapy: Secondary | ICD-10-CM | POA: Diagnosis not present

## 2017-07-13 DIAGNOSIS — Z0000 Encounter for general adult medical examination without abnormal findings: Secondary | ICD-10-CM | POA: Diagnosis not present

## 2017-07-13 DIAGNOSIS — E538 Deficiency of other specified B group vitamins: Secondary | ICD-10-CM | POA: Diagnosis not present

## 2017-07-13 DIAGNOSIS — R739 Hyperglycemia, unspecified: Secondary | ICD-10-CM | POA: Diagnosis not present

## 2017-07-13 DIAGNOSIS — F325 Major depressive disorder, single episode, in full remission: Secondary | ICD-10-CM | POA: Diagnosis not present

## 2017-07-13 DIAGNOSIS — Z1389 Encounter for screening for other disorder: Secondary | ICD-10-CM | POA: Diagnosis not present

## 2017-07-13 DIAGNOSIS — M353 Polymyalgia rheumatica: Secondary | ICD-10-CM | POA: Diagnosis not present

## 2017-07-14 DIAGNOSIS — R739 Hyperglycemia, unspecified: Secondary | ICD-10-CM | POA: Diagnosis not present

## 2017-07-14 DIAGNOSIS — E538 Deficiency of other specified B group vitamins: Secondary | ICD-10-CM | POA: Diagnosis not present

## 2017-07-14 DIAGNOSIS — D539 Nutritional anemia, unspecified: Secondary | ICD-10-CM | POA: Diagnosis not present

## 2017-07-26 DIAGNOSIS — D509 Iron deficiency anemia, unspecified: Secondary | ICD-10-CM | POA: Diagnosis not present

## 2017-07-26 DIAGNOSIS — I10 Essential (primary) hypertension: Secondary | ICD-10-CM | POA: Diagnosis not present

## 2017-08-11 DIAGNOSIS — M810 Age-related osteoporosis without current pathological fracture: Secondary | ICD-10-CM | POA: Diagnosis not present

## 2017-08-11 DIAGNOSIS — M797 Fibromyalgia: Secondary | ICD-10-CM | POA: Diagnosis not present

## 2017-08-11 DIAGNOSIS — M79644 Pain in right finger(s): Secondary | ICD-10-CM | POA: Diagnosis not present

## 2017-08-11 DIAGNOSIS — Z7952 Long term (current) use of systemic steroids: Secondary | ICD-10-CM | POA: Diagnosis not present

## 2017-08-11 DIAGNOSIS — E663 Overweight: Secondary | ICD-10-CM | POA: Diagnosis not present

## 2017-08-11 DIAGNOSIS — M79645 Pain in left finger(s): Secondary | ICD-10-CM | POA: Diagnosis not present

## 2017-08-11 DIAGNOSIS — M150 Primary generalized (osteo)arthritis: Secondary | ICD-10-CM | POA: Diagnosis not present

## 2017-08-11 DIAGNOSIS — M353 Polymyalgia rheumatica: Secondary | ICD-10-CM | POA: Diagnosis not present

## 2017-08-11 DIAGNOSIS — Z6828 Body mass index (BMI) 28.0-28.9, adult: Secondary | ICD-10-CM | POA: Diagnosis not present

## 2017-09-27 DIAGNOSIS — D649 Anemia, unspecified: Secondary | ICD-10-CM | POA: Diagnosis not present

## 2017-10-20 DIAGNOSIS — M1712 Unilateral primary osteoarthritis, left knee: Secondary | ICD-10-CM | POA: Diagnosis not present

## 2017-10-20 DIAGNOSIS — M1711 Unilateral primary osteoarthritis, right knee: Secondary | ICD-10-CM | POA: Diagnosis not present

## 2018-01-10 DIAGNOSIS — I10 Essential (primary) hypertension: Secondary | ICD-10-CM | POA: Diagnosis not present

## 2018-01-10 DIAGNOSIS — R413 Other amnesia: Secondary | ICD-10-CM | POA: Diagnosis not present

## 2018-01-25 DIAGNOSIS — M150 Primary generalized (osteo)arthritis: Secondary | ICD-10-CM | POA: Diagnosis not present

## 2018-01-25 DIAGNOSIS — E663 Overweight: Secondary | ICD-10-CM | POA: Diagnosis not present

## 2018-01-25 DIAGNOSIS — M79644 Pain in right finger(s): Secondary | ICD-10-CM | POA: Diagnosis not present

## 2018-01-25 DIAGNOSIS — M353 Polymyalgia rheumatica: Secondary | ICD-10-CM | POA: Diagnosis not present

## 2018-01-25 DIAGNOSIS — M810 Age-related osteoporosis without current pathological fracture: Secondary | ICD-10-CM | POA: Diagnosis not present

## 2018-01-25 DIAGNOSIS — M79645 Pain in left finger(s): Secondary | ICD-10-CM | POA: Diagnosis not present

## 2018-01-25 DIAGNOSIS — Z7952 Long term (current) use of systemic steroids: Secondary | ICD-10-CM | POA: Diagnosis not present

## 2018-01-25 DIAGNOSIS — Z6827 Body mass index (BMI) 27.0-27.9, adult: Secondary | ICD-10-CM | POA: Diagnosis not present

## 2018-01-25 DIAGNOSIS — M797 Fibromyalgia: Secondary | ICD-10-CM | POA: Diagnosis not present

## 2018-02-07 ENCOUNTER — Emergency Department (HOSPITAL_COMMUNITY): Payer: Medicare Other

## 2018-02-07 ENCOUNTER — Encounter (HOSPITAL_COMMUNITY): Payer: Self-pay | Admitting: Emergency Medicine

## 2018-02-07 ENCOUNTER — Emergency Department (HOSPITAL_COMMUNITY)
Admission: EM | Admit: 2018-02-07 | Discharge: 2018-02-07 | Disposition: A | Discharge status: Home / Self Care | Payer: Medicare Other | Attending: Emergency Medicine | Admitting: Emergency Medicine

## 2018-02-07 DIAGNOSIS — M545 Low back pain: Secondary | ICD-10-CM | POA: Diagnosis not present

## 2018-02-07 MED ORDER — CYCLOBENZAPRINE HCL 10 MG PO TABS
5.0000 mg | ORAL_TABLET | Freq: Once | ORAL | Status: AC
  Administered 2018-02-07: 5 mg via ORAL
  Filled 2018-02-07: qty 1

## 2018-02-07 MED ORDER — ACETAMINOPHEN 325 MG PO TABS
650.0000 mg | ORAL_TABLET | Freq: Once | ORAL | Status: AC
  Administered 2018-02-07: 650 mg via ORAL
  Filled 2018-02-07: qty 2

## 2018-02-07 MED ORDER — LIDOCAINE 5 % EX PTCH
1.0000 | MEDICATED_PATCH | Freq: Once | CUTANEOUS | Status: DC
  Administered 2018-02-07: 1 via TRANSDERMAL
  Filled 2018-02-07: qty 1

## 2018-02-07 MED ORDER — CYCLOBENZAPRINE HCL 5 MG PO TABS: 5.0000 mg | ORAL_TABLET | Freq: Three times a day (TID) | ORAL | 0 refills | Status: DC | PRN

## 2018-02-07 MED ORDER — LIDOCAINE 5 % EX PTCH: 1.0000 | MEDICATED_PATCH | CUTANEOUS | 0 refills | Status: DC

## 2018-02-07 NOTE — ED Provider Notes (Signed)
[facility] Emergency Department Provider Note MRN:  [id number]  Arrival date & time: 02/07/18     Chief Complaint   Back Pain and Leg Pain   History of Present Illness   Eileen Jennings is a 77 y.o. year-old female with a history of polymyalgia rheumatica presenting to the ED with chief complaint of back pain.  Pain is located in the midline in the left lumbar back.  Pain radiates to the anterior left thigh.  The pain began suddenly when she was attempting to pull herself up onto a farm cart 1 week ago.  Pain is been persistent since that time, worse with movement, largely unable to sleep last night due to the pain.  Denies recent fever, no headache or vision change, no chest pain or shortness of breath, no abdominal pain, no bowel or bladder dysfunction, no numbness or weakness of the arms or legs.  Review of Systems  A complete 10 system review of systems was obtained and all systems are negative except as noted in the HPI and PMH.   Patient's Health History    Past Medical History:  Diagnosis Date  . Anemia    comes and goes  . Anxiety    takes Ativan  . Arthritis    Hx osteoarthritis  . Blood transfusion age 50   tonsillectomy as child  . Depression    takes Cymbalta  . Fibromyalgia    pt. had polymyalgia not fibromyalgia  . Headache(784.0)    takes Goody powders  . Hypertension    takes Metoprolol  . PONV (postoperative nausea and vomiting)     Past Surgical History:  Procedure Laterality Date  . ABDOMINAL HYSTERECTOMY  20 yrs. ago  . COLONOSCOPY WITH PROPOFOL N/A 01/30/2013   Procedure: COLONOSCOPY WITH PROPOFOL;  Surgeon: Garlan Fair, MD;  Location: [facility];  Service: Endoscopy;  Laterality: N/A;  . CYSTOCELE REPAIR  age 89 with rectocele  . ESOPHAGOGASTRODUODENOSCOPY (EGD) WITH PROPOFOL N/A 01/30/2013   Procedure: ESOPHAGOGASTRODUODENOSCOPY (EGD) WITH PROPOFOL;  Surgeon: Garlan Fair, MD;  Location: [facility];  Service: Endoscopy;   Laterality: N/A;  . INCONTINENCE SURGERY  4 yrs. ago  . RECTOCELE REPAIR  age 42  . TONSILLECTOMY     age 89  . TOTAL KNEE ARTHROPLASTY  03/31/2011   Procedure: TOTAL KNEE ARTHROPLASTY;  Surgeon: Gearlean Alf;  Location: [facility];  Service: Orthopedics;  Laterality: Left;    Family History  Problem Relation Age of Onset  . CAD Father   . Depression Father   . Stroke Other   . Drug abuse Mother        prescription drug abuse  . Breast cancer Neg Hx     Social History   Socioeconomic History  . Marital status: Married    Spouse name: Not on file  . Number of children: Not on file  . Years of education: Not on file  . Highest education level: Not on file  Occupational History  . Not on file  Social Needs  . Financial resource strain: Not on file  . Food insecurity:    Worry: Not on file    Inability: Not on file  . Transportation needs:    Medical: Not on file    Non-medical: Not on file  Tobacco Use  . Smoking status: Former Smoker    Packs/day: 0.50    Years: 8.00    Pack years: 4.00    Last attempt to quit: 03/29/1971  Years since quitting: 46.8  . Smokeless tobacco: Never Used  Substance and Sexual Activity  . Alcohol use: No  . Drug use: No  . Sexual activity: Never  Lifestyle  . Physical activity:    Days per week: Not on file    Minutes per session: Not on file  . Stress: Not on file  Relationships  . Social connections:    Talks on phone: Not on file    Gets together: Not on file    Attends religious service: Not on file    Active member of club or organization: Not on file    Attends meetings of clubs or organizations: Not on file    Relationship status: Not on file  . Intimate partner violence:    Fear of current or ex partner: Not on file    Emotionally abused: Not on file    Physically abused: Not on file    Forced sexual activity: Not on file  Other Topics Concern  . Not on file  Social History Narrative  . Not on file     Physical Exam   Vital Signs and Nursing Notes reviewed Vitals:   02/07/18 1311  BP: 93/63  Pulse: 87  Resp: 18  Temp: 98.3 F (36.8 C)  SpO2: 96%    CONSTITUTIONAL: Well-appearing, NAD NEURO:  Alert and oriented x 3, no focal deficits EYES:  eyes equal and reactive ENT/NECK:  no LAD, no JVD CARDIO: Regular rate, well-perfused, normal S1 and S2 PULM:  CTAB no wheezing or rhonchi GI/GU:  normal bowel sounds, non-distended, non-tender MSK/SPINE:  No gross deformities, no edema, minimal lumbar midline tenderness, normal range of motion of bilateral hips SKIN:  no rash, atraumatic PSYCH:  Appropriate speech and behavior  Diagnostic and Interventional Summary    EKG Interpretation  Date/Time:    Ventricular Rate:    PR Interval:    QRS Duration:   QT Interval:    QTC Calculation:   R Axis:     Text Interpretation:        Labs Reviewed - No data to display  DG Lumbar Spine Complete  Final Result      Medications  lidocaine (LIDODERM) 5 % 1 patch (1 patch Transdermal Patch Applied 02/07/18 1752)  acetaminophen (TYLENOL) tablet 650 mg (650 mg Oral Given 02/07/18 1753)  cyclobenzaprine (FLEXERIL) tablet 5 mg (5 mg Oral Given 02/07/18 1754)     Procedures Critical Care  ED Course and Medical Decision Making  I have reviewed the triage vital signs and the nursing notes.  Pertinent labs & imaging results that were available during my care of the patient were reviewed by me and considered in my medical decision making (see below for details).    Favoring muscle strain or spasm in this 77 year old female with persistent back pain after pulling motion 1 week ago.  Radicular back pain also possible given the radiation to the anterior thigh.  No bowel or bladder dysfunction or neurological deficits to suggest myelopathy.  Given patient's age and chronic steroid use, will screen with x-ray to rule out compression fracture.  X-ray reassuring, given prescription for Lidoderm patches and low-dose  Flexeril.  Will follow up with PCP.  After the discussed management above, the patient was determined to be safe for discharge.  The patient was in agreement with this plan and all questions regarding their care were answered.  ED return precautions were discussed and the patient will return to the ED with any significant worsening  of condition.  Barth Kirks. Sedonia Small, [place] [email or web address]  Final Clinical Impressions(s) / ED Diagnoses     ICD-10-CM   1. Acute left-sided low back pain, with sciatica presence unspecified M54.5     ED Discharge Orders         Ordered    cyclobenzaprine (FLEXERIL) 5 MG tablet  3 times daily PRN     02/07/18 1805    lidocaine (LIDODERM) 5 %  Every 24 hours     02/07/18 1805             Maudie Flakes, MD 02/07/18 1806

## 2018-02-07 NOTE — Discharge Instructions (Addendum)
You were evaluated in the Emergency Department and after careful evaluation, we did not find any emergent condition requiring admission or further testing in the hospital.  Your symptoms today seem to be due to muscle strain or sprain.  Please use the medications provided as needed for pain.  Please return to the Emergency Department if you experience any worsening of your condition.  We encourage you to follow up with a primary care provider.  Thank you for allowing [place] to be a part of your care.

## 2018-02-07 NOTE — ED Triage Notes (Signed)
Pt was getting into a farm cart about week ago and when pulled herself up into it by holding on the handle got back pains with left leg pain since.

## 2018-03-17 DIAGNOSIS — M7072 Other bursitis of hip, left hip: Secondary | ICD-10-CM | POA: Diagnosis not present

## 2018-03-17 DIAGNOSIS — M25552 Pain in left hip: Secondary | ICD-10-CM | POA: Diagnosis not present

## 2018-03-17 DIAGNOSIS — R350 Frequency of micturition: Secondary | ICD-10-CM | POA: Diagnosis not present

## 2018-03-17 DIAGNOSIS — Z23 Encounter for immunization: Secondary | ICD-10-CM | POA: Diagnosis not present

## 2018-03-17 DIAGNOSIS — R413 Other amnesia: Secondary | ICD-10-CM | POA: Diagnosis not present

## 2018-03-23 DIAGNOSIS — R531 Weakness: Secondary | ICD-10-CM | POA: Diagnosis not present

## 2018-03-23 DIAGNOSIS — R51 Headache: Secondary | ICD-10-CM | POA: Diagnosis not present

## 2018-03-23 DIAGNOSIS — R1110 Vomiting, unspecified: Secondary | ICD-10-CM | POA: Diagnosis not present

## 2018-03-23 DIAGNOSIS — R404 Transient alteration of awareness: Secondary | ICD-10-CM | POA: Diagnosis not present

## 2018-03-23 DIAGNOSIS — H547 Unspecified visual loss: Secondary | ICD-10-CM | POA: Diagnosis not present

## 2018-03-23 DIAGNOSIS — I639 Cerebral infarction, unspecified: Secondary | ICD-10-CM | POA: Diagnosis not present

## 2018-03-23 DIAGNOSIS — G459 Transient cerebral ischemic attack, unspecified: Secondary | ICD-10-CM | POA: Diagnosis not present

## 2018-03-23 DIAGNOSIS — R29818 Other symptoms and signs involving the nervous system: Secondary | ICD-10-CM | POA: Diagnosis not present

## 2018-03-23 DIAGNOSIS — R52 Pain, unspecified: Secondary | ICD-10-CM | POA: Diagnosis not present

## 2018-03-23 DIAGNOSIS — Z87891 Personal history of nicotine dependence: Secondary | ICD-10-CM | POA: Diagnosis not present

## 2018-03-23 DIAGNOSIS — I63531 Cerebral infarction due to unspecified occlusion or stenosis of right posterior cerebral artery: Secondary | ICD-10-CM | POA: Diagnosis not present

## 2018-03-23 DIAGNOSIS — I63431 Cerebral infarction due to embolism of right posterior cerebral artery: Secondary | ICD-10-CM | POA: Diagnosis not present

## 2018-03-23 DIAGNOSIS — R402411 Glasgow coma scale score 13-15, in the field [EMT or ambulance]: Secondary | ICD-10-CM | POA: Diagnosis not present

## 2018-03-23 DIAGNOSIS — Z79899 Other long term (current) drug therapy: Secondary | ICD-10-CM | POA: Diagnosis not present

## 2018-03-23 DIAGNOSIS — K449 Diaphragmatic hernia without obstruction or gangrene: Secondary | ICD-10-CM | POA: Diagnosis not present

## 2018-03-23 DIAGNOSIS — G8190 Hemiplegia, unspecified affecting unspecified side: Secondary | ICD-10-CM | POA: Diagnosis not present

## 2018-03-23 DIAGNOSIS — I63411 Cerebral infarction due to embolism of right middle cerebral artery: Secondary | ICD-10-CM | POA: Diagnosis not present

## 2018-03-24 DIAGNOSIS — N3000 Acute cystitis without hematuria: Secondary | ICD-10-CM | POA: Diagnosis present

## 2018-03-24 DIAGNOSIS — Z7952 Long term (current) use of systemic steroids: Secondary | ICD-10-CM | POA: Diagnosis not present

## 2018-03-24 DIAGNOSIS — J329 Chronic sinusitis, unspecified: Secondary | ICD-10-CM | POA: Diagnosis not present

## 2018-03-24 DIAGNOSIS — I63431 Cerebral infarction due to embolism of right posterior cerebral artery: Secondary | ICD-10-CM | POA: Diagnosis present

## 2018-03-24 DIAGNOSIS — Z79899 Other long term (current) drug therapy: Secondary | ICD-10-CM | POA: Diagnosis not present

## 2018-03-24 DIAGNOSIS — R29818 Other symptoms and signs involving the nervous system: Secondary | ICD-10-CM | POA: Diagnosis not present

## 2018-03-24 DIAGNOSIS — Z87891 Personal history of nicotine dependence: Secondary | ICD-10-CM | POA: Diagnosis not present

## 2018-03-24 DIAGNOSIS — I059 Rheumatic mitral valve disease, unspecified: Secondary | ICD-10-CM | POA: Diagnosis not present

## 2018-03-24 DIAGNOSIS — I10 Essential (primary) hypertension: Secondary | ICD-10-CM | POA: Diagnosis present

## 2018-03-24 DIAGNOSIS — R918 Other nonspecific abnormal finding of lung field: Secondary | ICD-10-CM | POA: Diagnosis not present

## 2018-03-24 DIAGNOSIS — R29703 NIHSS score 3: Secondary | ICD-10-CM | POA: Diagnosis present

## 2018-03-24 DIAGNOSIS — F0390 Unspecified dementia without behavioral disturbance: Secondary | ICD-10-CM | POA: Diagnosis present

## 2018-03-24 DIAGNOSIS — R531 Weakness: Secondary | ICD-10-CM | POA: Diagnosis not present

## 2018-03-24 DIAGNOSIS — E785 Hyperlipidemia, unspecified: Secondary | ICD-10-CM | POA: Diagnosis present

## 2018-03-24 DIAGNOSIS — I639 Cerebral infarction, unspecified: Secondary | ICD-10-CM | POA: Diagnosis not present

## 2018-03-28 DIAGNOSIS — M797 Fibromyalgia: Secondary | ICD-10-CM | POA: Diagnosis not present

## 2018-03-28 DIAGNOSIS — Z87891 Personal history of nicotine dependence: Secondary | ICD-10-CM | POA: Diagnosis not present

## 2018-03-28 DIAGNOSIS — I10 Essential (primary) hypertension: Secondary | ICD-10-CM | POA: Diagnosis not present

## 2018-03-28 DIAGNOSIS — Z87440 Personal history of urinary (tract) infections: Secondary | ICD-10-CM | POA: Diagnosis not present

## 2018-03-28 DIAGNOSIS — H53462 Homonymous bilateral field defects, left side: Secondary | ICD-10-CM | POA: Diagnosis not present

## 2018-03-28 DIAGNOSIS — Z7952 Long term (current) use of systemic steroids: Secondary | ICD-10-CM | POA: Diagnosis not present

## 2018-03-28 DIAGNOSIS — Z79899 Other long term (current) drug therapy: Secondary | ICD-10-CM | POA: Diagnosis not present

## 2018-03-28 DIAGNOSIS — F0390 Unspecified dementia without behavioral disturbance: Secondary | ICD-10-CM | POA: Diagnosis not present

## 2018-03-28 DIAGNOSIS — I69398 Other sequelae of cerebral infarction: Secondary | ICD-10-CM | POA: Diagnosis not present

## 2018-03-28 DIAGNOSIS — R414 Neurologic neglect syndrome: Secondary | ICD-10-CM | POA: Diagnosis not present

## 2018-03-30 DIAGNOSIS — M797 Fibromyalgia: Secondary | ICD-10-CM | POA: Diagnosis not present

## 2018-03-30 DIAGNOSIS — I10 Essential (primary) hypertension: Secondary | ICD-10-CM | POA: Diagnosis not present

## 2018-03-30 DIAGNOSIS — I69398 Other sequelae of cerebral infarction: Secondary | ICD-10-CM | POA: Diagnosis not present

## 2018-03-30 DIAGNOSIS — F0390 Unspecified dementia without behavioral disturbance: Secondary | ICD-10-CM | POA: Diagnosis not present

## 2018-03-30 DIAGNOSIS — R414 Neurologic neglect syndrome: Secondary | ICD-10-CM | POA: Diagnosis not present

## 2018-03-30 DIAGNOSIS — H53462 Homonymous bilateral field defects, left side: Secondary | ICD-10-CM | POA: Diagnosis not present

## 2018-04-04 DIAGNOSIS — I10 Essential (primary) hypertension: Secondary | ICD-10-CM | POA: Diagnosis not present

## 2018-04-04 DIAGNOSIS — M797 Fibromyalgia: Secondary | ICD-10-CM | POA: Diagnosis not present

## 2018-04-04 DIAGNOSIS — H53462 Homonymous bilateral field defects, left side: Secondary | ICD-10-CM | POA: Diagnosis not present

## 2018-04-04 DIAGNOSIS — R414 Neurologic neglect syndrome: Secondary | ICD-10-CM | POA: Diagnosis not present

## 2018-04-04 DIAGNOSIS — I69398 Other sequelae of cerebral infarction: Secondary | ICD-10-CM | POA: Diagnosis not present

## 2018-04-04 DIAGNOSIS — F0390 Unspecified dementia without behavioral disturbance: Secondary | ICD-10-CM | POA: Diagnosis not present

## 2018-04-05 DIAGNOSIS — I498 Other specified cardiac arrhythmias: Secondary | ICD-10-CM | POA: Diagnosis not present

## 2018-04-05 DIAGNOSIS — I63431 Cerebral infarction due to embolism of right posterior cerebral artery: Secondary | ICD-10-CM | POA: Diagnosis not present

## 2018-04-05 DIAGNOSIS — E559 Vitamin D deficiency, unspecified: Secondary | ICD-10-CM | POA: Diagnosis not present

## 2018-04-06 DIAGNOSIS — H53462 Homonymous bilateral field defects, left side: Secondary | ICD-10-CM | POA: Diagnosis not present

## 2018-04-06 DIAGNOSIS — R414 Neurologic neglect syndrome: Secondary | ICD-10-CM | POA: Diagnosis not present

## 2018-04-06 DIAGNOSIS — F0390 Unspecified dementia without behavioral disturbance: Secondary | ICD-10-CM | POA: Diagnosis not present

## 2018-04-06 DIAGNOSIS — M797 Fibromyalgia: Secondary | ICD-10-CM | POA: Diagnosis not present

## 2018-04-06 DIAGNOSIS — I69398 Other sequelae of cerebral infarction: Secondary | ICD-10-CM | POA: Diagnosis not present

## 2018-04-06 DIAGNOSIS — I10 Essential (primary) hypertension: Secondary | ICD-10-CM | POA: Diagnosis not present

## 2018-04-10 DIAGNOSIS — M797 Fibromyalgia: Secondary | ICD-10-CM | POA: Diagnosis not present

## 2018-04-10 DIAGNOSIS — M25552 Pain in left hip: Secondary | ICD-10-CM | POA: Diagnosis not present

## 2018-04-10 DIAGNOSIS — H53462 Homonymous bilateral field defects, left side: Secondary | ICD-10-CM | POA: Diagnosis not present

## 2018-04-10 DIAGNOSIS — E7800 Pure hypercholesterolemia, unspecified: Secondary | ICD-10-CM | POA: Diagnosis not present

## 2018-04-10 DIAGNOSIS — Z8673 Personal history of transient ischemic attack (TIA), and cerebral infarction without residual deficits: Secondary | ICD-10-CM | POA: Diagnosis not present

## 2018-04-10 DIAGNOSIS — I69398 Other sequelae of cerebral infarction: Secondary | ICD-10-CM | POA: Diagnosis not present

## 2018-04-10 DIAGNOSIS — R413 Other amnesia: Secondary | ICD-10-CM | POA: Diagnosis not present

## 2018-04-10 DIAGNOSIS — R7309 Other abnormal glucose: Secondary | ICD-10-CM | POA: Diagnosis not present

## 2018-04-10 DIAGNOSIS — M353 Polymyalgia rheumatica: Secondary | ICD-10-CM | POA: Diagnosis not present

## 2018-04-10 DIAGNOSIS — F0390 Unspecified dementia without behavioral disturbance: Secondary | ICD-10-CM | POA: Diagnosis not present

## 2018-04-10 DIAGNOSIS — R414 Neurologic neglect syndrome: Secondary | ICD-10-CM | POA: Diagnosis not present

## 2018-04-10 DIAGNOSIS — I10 Essential (primary) hypertension: Secondary | ICD-10-CM | POA: Diagnosis not present

## 2018-04-14 DIAGNOSIS — M797 Fibromyalgia: Secondary | ICD-10-CM | POA: Diagnosis not present

## 2018-04-14 DIAGNOSIS — R414 Neurologic neglect syndrome: Secondary | ICD-10-CM | POA: Diagnosis not present

## 2018-04-14 DIAGNOSIS — H53462 Homonymous bilateral field defects, left side: Secondary | ICD-10-CM | POA: Diagnosis not present

## 2018-04-14 DIAGNOSIS — I69398 Other sequelae of cerebral infarction: Secondary | ICD-10-CM | POA: Diagnosis not present

## 2018-04-14 DIAGNOSIS — I10 Essential (primary) hypertension: Secondary | ICD-10-CM | POA: Diagnosis not present

## 2018-04-14 DIAGNOSIS — F0390 Unspecified dementia without behavioral disturbance: Secondary | ICD-10-CM | POA: Diagnosis not present

## 2018-05-08 DIAGNOSIS — I498 Other specified cardiac arrhythmias: Secondary | ICD-10-CM | POA: Diagnosis not present

## 2018-05-29 DIAGNOSIS — I10 Essential (primary) hypertension: Secondary | ICD-10-CM | POA: Diagnosis not present

## 2018-05-29 DIAGNOSIS — G301 Alzheimer's disease with late onset: Secondary | ICD-10-CM | POA: Diagnosis not present

## 2018-05-29 DIAGNOSIS — M810 Age-related osteoporosis without current pathological fracture: Secondary | ICD-10-CM | POA: Diagnosis not present

## 2018-05-29 DIAGNOSIS — F0280 Dementia in other diseases classified elsewhere without behavioral disturbance: Secondary | ICD-10-CM | POA: Diagnosis not present

## 2018-05-29 DIAGNOSIS — M353 Polymyalgia rheumatica: Secondary | ICD-10-CM | POA: Diagnosis not present

## 2018-06-07 DIAGNOSIS — M810 Age-related osteoporosis without current pathological fracture: Secondary | ICD-10-CM | POA: Diagnosis not present

## 2018-06-12 DIAGNOSIS — I639 Cerebral infarction, unspecified: Secondary | ICD-10-CM | POA: Diagnosis not present

## 2018-07-21 DIAGNOSIS — R7303 Prediabetes: Secondary | ICD-10-CM | POA: Diagnosis not present

## 2018-07-21 DIAGNOSIS — I10 Essential (primary) hypertension: Secondary | ICD-10-CM | POA: Diagnosis not present

## 2018-07-21 DIAGNOSIS — Z1389 Encounter for screening for other disorder: Secondary | ICD-10-CM | POA: Diagnosis not present

## 2018-07-21 DIAGNOSIS — Z79899 Other long term (current) drug therapy: Secondary | ICD-10-CM | POA: Diagnosis not present

## 2018-07-21 DIAGNOSIS — F325 Major depressive disorder, single episode, in full remission: Secondary | ICD-10-CM | POA: Diagnosis not present

## 2018-07-21 DIAGNOSIS — M353 Polymyalgia rheumatica: Secondary | ICD-10-CM | POA: Diagnosis not present

## 2018-07-21 DIAGNOSIS — G301 Alzheimer's disease with late onset: Secondary | ICD-10-CM | POA: Diagnosis not present

## 2018-07-21 DIAGNOSIS — F0280 Dementia in other diseases classified elsewhere without behavioral disturbance: Secondary | ICD-10-CM | POA: Diagnosis not present

## 2018-07-21 DIAGNOSIS — Z0000 Encounter for general adult medical examination without abnormal findings: Secondary | ICD-10-CM | POA: Diagnosis not present

## 2018-07-26 DIAGNOSIS — Z7952 Long term (current) use of systemic steroids: Secondary | ICD-10-CM | POA: Diagnosis not present

## 2018-07-26 DIAGNOSIS — M810 Age-related osteoporosis without current pathological fracture: Secondary | ICD-10-CM | POA: Diagnosis not present

## 2018-07-26 DIAGNOSIS — M353 Polymyalgia rheumatica: Secondary | ICD-10-CM | POA: Diagnosis not present

## 2018-07-26 DIAGNOSIS — M150 Primary generalized (osteo)arthritis: Secondary | ICD-10-CM | POA: Diagnosis not present

## 2018-07-26 DIAGNOSIS — E663 Overweight: Secondary | ICD-10-CM | POA: Diagnosis not present

## 2018-07-26 DIAGNOSIS — M79645 Pain in left finger(s): Secondary | ICD-10-CM | POA: Diagnosis not present

## 2018-07-26 DIAGNOSIS — M79644 Pain in right finger(s): Secondary | ICD-10-CM | POA: Diagnosis not present

## 2018-07-26 DIAGNOSIS — Z6825 Body mass index (BMI) 25.0-25.9, adult: Secondary | ICD-10-CM | POA: Diagnosis not present

## 2018-07-26 DIAGNOSIS — M797 Fibromyalgia: Secondary | ICD-10-CM | POA: Diagnosis not present

## 2019-01-22 DIAGNOSIS — I10 Essential (primary) hypertension: Secondary | ICD-10-CM | POA: Diagnosis not present

## 2019-01-26 DIAGNOSIS — M797 Fibromyalgia: Secondary | ICD-10-CM | POA: Diagnosis not present

## 2019-01-26 DIAGNOSIS — Z6827 Body mass index (BMI) 27.0-27.9, adult: Secondary | ICD-10-CM | POA: Diagnosis not present

## 2019-01-26 DIAGNOSIS — Z79899 Other long term (current) drug therapy: Secondary | ICD-10-CM | POA: Diagnosis not present

## 2019-01-26 DIAGNOSIS — E663 Overweight: Secondary | ICD-10-CM | POA: Diagnosis not present

## 2019-01-26 DIAGNOSIS — M353 Polymyalgia rheumatica: Secondary | ICD-10-CM | POA: Diagnosis not present

## 2019-02-22 DIAGNOSIS — R3911 Hesitancy of micturition: Secondary | ICD-10-CM | POA: Diagnosis not present

## 2019-02-22 DIAGNOSIS — R300 Dysuria: Secondary | ICD-10-CM | POA: Diagnosis not present

## 2019-04-04 DIAGNOSIS — Z23 Encounter for immunization: Secondary | ICD-10-CM | POA: Diagnosis not present

## 2019-06-12 DIAGNOSIS — M810 Age-related osteoporosis without current pathological fracture: Secondary | ICD-10-CM | POA: Diagnosis not present

## 2019-07-27 DIAGNOSIS — M797 Fibromyalgia: Secondary | ICD-10-CM | POA: Diagnosis not present

## 2019-07-27 DIAGNOSIS — M353 Polymyalgia rheumatica: Secondary | ICD-10-CM | POA: Diagnosis not present

## 2019-07-27 DIAGNOSIS — Z79899 Other long term (current) drug therapy: Secondary | ICD-10-CM | POA: Diagnosis not present

## 2019-08-06 DIAGNOSIS — M353 Polymyalgia rheumatica: Secondary | ICD-10-CM | POA: Diagnosis not present

## 2019-08-06 DIAGNOSIS — F0280 Dementia in other diseases classified elsewhere without behavioral disturbance: Secondary | ICD-10-CM | POA: Diagnosis not present

## 2019-08-06 DIAGNOSIS — E7800 Pure hypercholesterolemia, unspecified: Secondary | ICD-10-CM | POA: Diagnosis not present

## 2019-08-06 DIAGNOSIS — Z1389 Encounter for screening for other disorder: Secondary | ICD-10-CM | POA: Diagnosis not present

## 2019-08-06 DIAGNOSIS — I10 Essential (primary) hypertension: Secondary | ICD-10-CM | POA: Diagnosis not present

## 2019-08-06 DIAGNOSIS — Z79899 Other long term (current) drug therapy: Secondary | ICD-10-CM | POA: Diagnosis not present

## 2019-08-06 DIAGNOSIS — G301 Alzheimer's disease with late onset: Secondary | ICD-10-CM | POA: Diagnosis not present

## 2019-08-06 DIAGNOSIS — Z0000 Encounter for general adult medical examination without abnormal findings: Secondary | ICD-10-CM | POA: Diagnosis not present

## 2019-09-18 DIAGNOSIS — D473 Essential (hemorrhagic) thrombocythemia: Secondary | ICD-10-CM | POA: Diagnosis not present

## 2019-10-15 DIAGNOSIS — D473 Essential (hemorrhagic) thrombocythemia: Secondary | ICD-10-CM | POA: Diagnosis not present

## 2019-12-10 DIAGNOSIS — M810 Age-related osteoporosis without current pathological fracture: Secondary | ICD-10-CM | POA: Diagnosis not present

## 2020-01-24 DIAGNOSIS — M150 Primary generalized (osteo)arthritis: Secondary | ICD-10-CM | POA: Diagnosis not present

## 2020-01-24 DIAGNOSIS — Z6830 Body mass index (BMI) 30.0-30.9, adult: Secondary | ICD-10-CM | POA: Diagnosis not present

## 2020-01-24 DIAGNOSIS — M797 Fibromyalgia: Secondary | ICD-10-CM | POA: Diagnosis not present

## 2020-01-24 DIAGNOSIS — M353 Polymyalgia rheumatica: Secondary | ICD-10-CM | POA: Diagnosis not present

## 2020-01-24 DIAGNOSIS — M810 Age-related osteoporosis without current pathological fracture: Secondary | ICD-10-CM | POA: Diagnosis not present

## 2020-01-24 DIAGNOSIS — E669 Obesity, unspecified: Secondary | ICD-10-CM | POA: Diagnosis not present

## 2020-07-28 DIAGNOSIS — Z6829 Body mass index (BMI) 29.0-29.9, adult: Secondary | ICD-10-CM | POA: Diagnosis not present

## 2020-07-28 DIAGNOSIS — M810 Age-related osteoporosis without current pathological fracture: Secondary | ICD-10-CM | POA: Diagnosis not present

## 2020-07-28 DIAGNOSIS — M353 Polymyalgia rheumatica: Secondary | ICD-10-CM | POA: Diagnosis not present

## 2020-07-28 DIAGNOSIS — E663 Overweight: Secondary | ICD-10-CM | POA: Diagnosis not present

## 2020-07-28 DIAGNOSIS — M150 Primary generalized (osteo)arthritis: Secondary | ICD-10-CM | POA: Diagnosis not present

## 2020-07-28 DIAGNOSIS — M797 Fibromyalgia: Secondary | ICD-10-CM | POA: Diagnosis not present

## 2020-09-06 IMAGING — CR DG LUMBAR SPINE COMPLETE 4+V
5 series · 5 of 5 positions shown · non-contrast
Comparison: CT abdomen pelvis 08/01/2013

CLINICAL DATA: Low back pain.

EXAM:
LUMBAR SPINE - COMPLETE 4+ VIEW

[t lumbar spine ap]
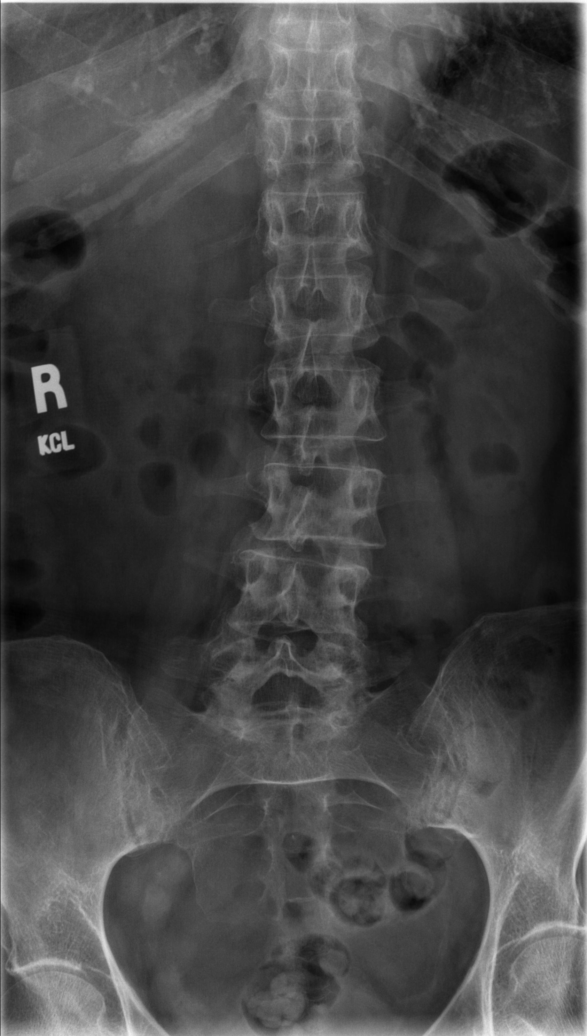

[t lumbar spine obl (1 of 2)]
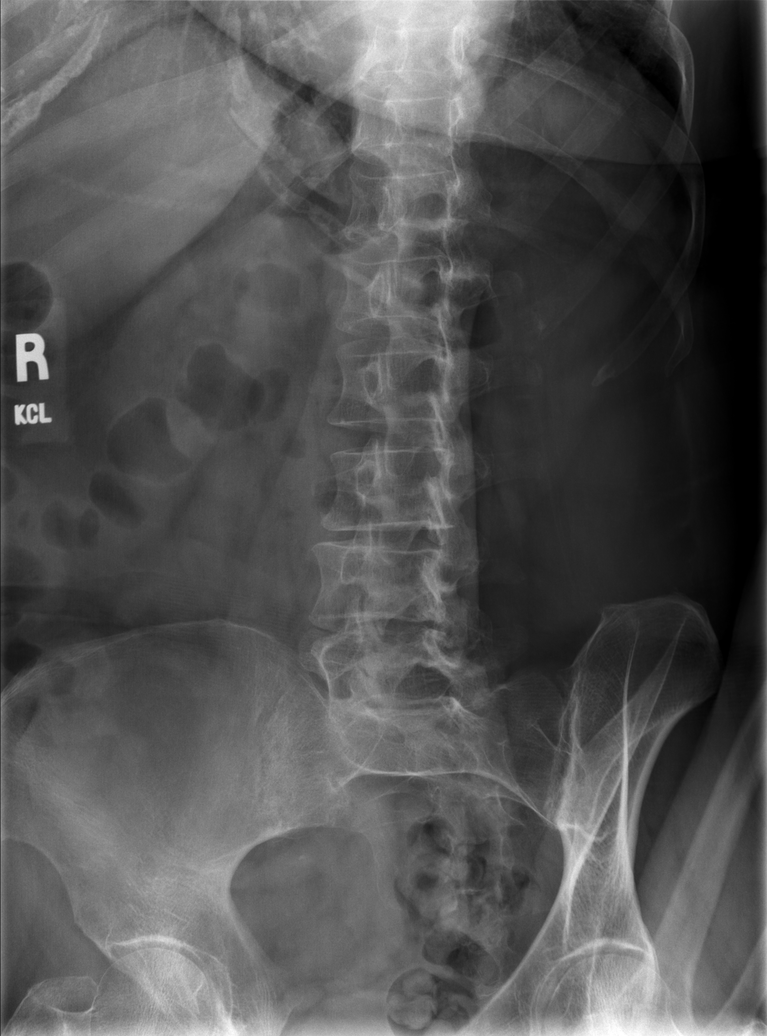

[t lumbar spine obl (2 of 2)]
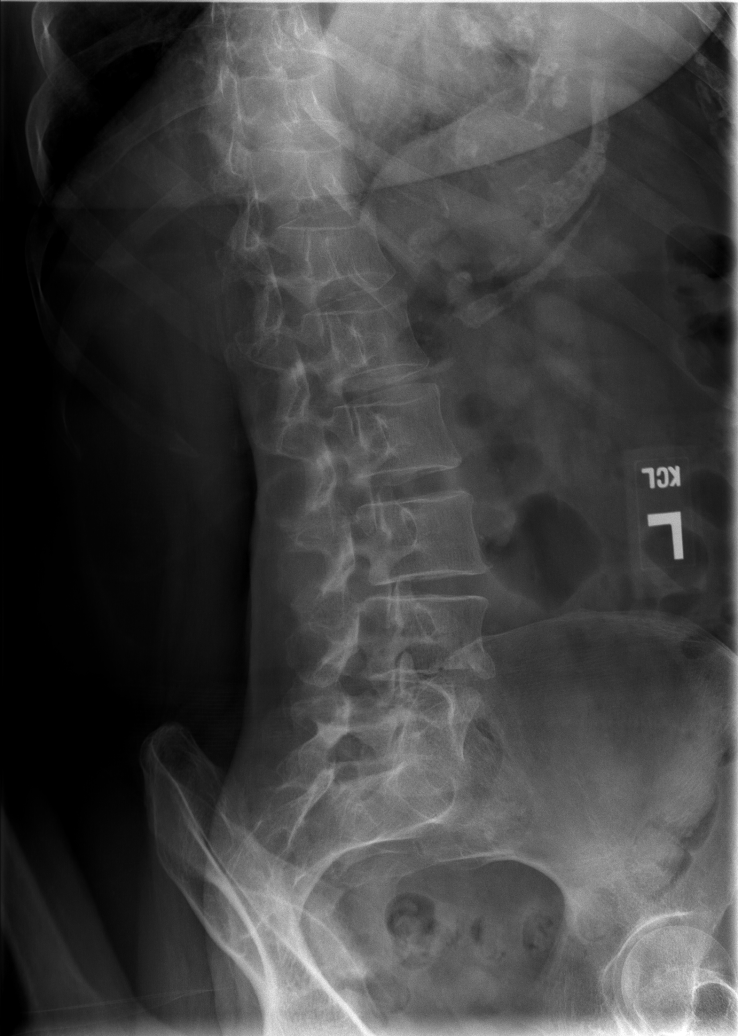

[t lumbar spine lat]
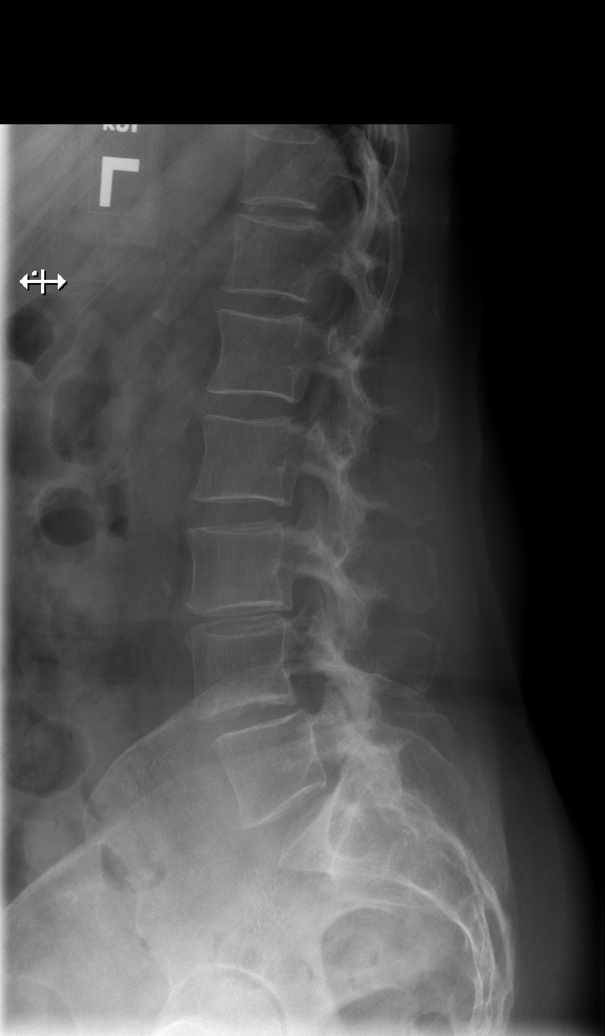

[t lumbar l-5 s-1 spot]
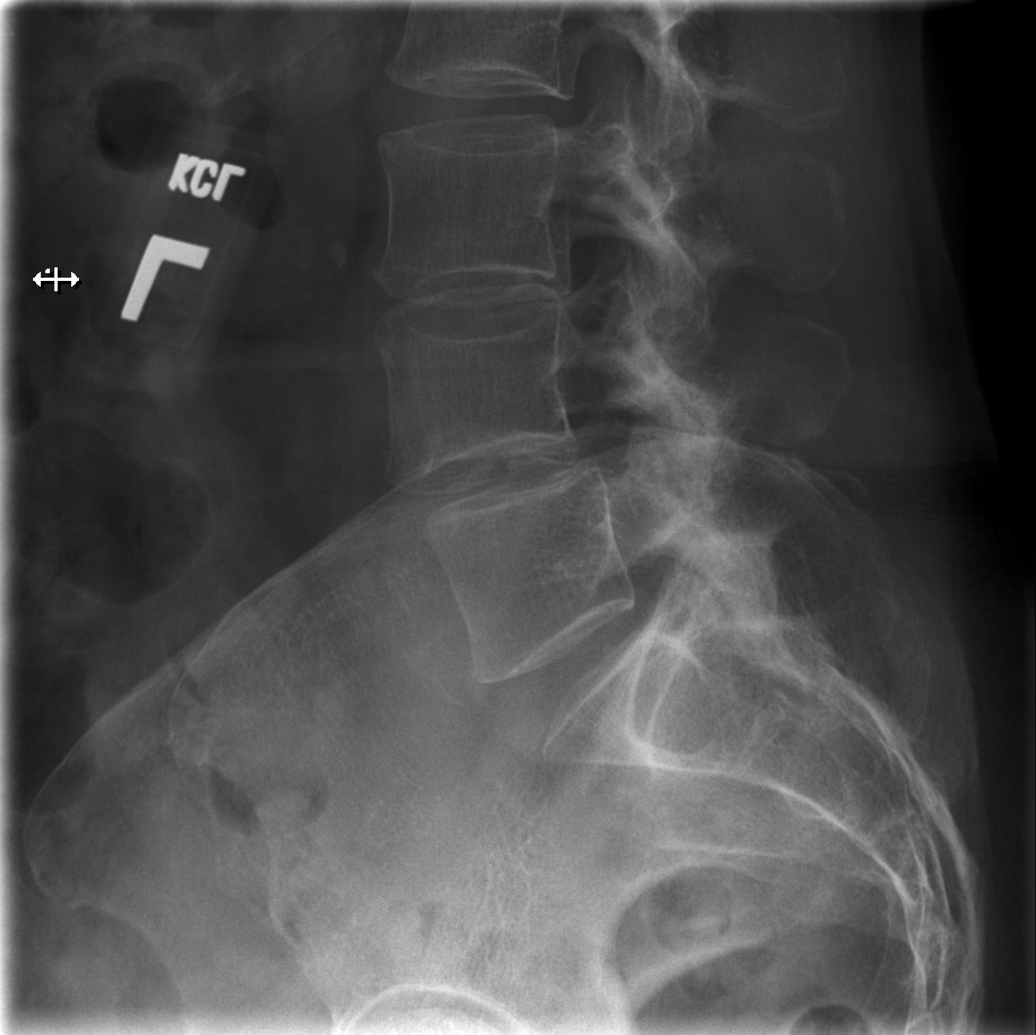

[5 of 5 positions shown; findings below may reference images not displayed]

FINDINGS: There is grade 1 anterolisthesis at L4-L5, likely due to facet
hypertrophy. There is no compression fracture or vertebral body
height loss. Intervertebral disc spaces are preserved. No pars
interarticularis fracture. There are 5 lumbar vertebral bodies.
Hypoplastic ribs at T12.
IMPRESSION: Grade 1 anterolisthesis, likely degenerative and due to facet
hypertrophy. No acute abnormality.

## 2021-01-28 ENCOUNTER — Other Ambulatory Visit (HOSPITAL_COMMUNITY): Payer: Self-pay | Admitting: Internal Medicine

## 2021-01-28 DIAGNOSIS — M150 Primary generalized (osteo)arthritis: Secondary | ICD-10-CM | POA: Diagnosis not present

## 2021-01-28 DIAGNOSIS — M797 Fibromyalgia: Secondary | ICD-10-CM | POA: Diagnosis not present

## 2021-01-28 DIAGNOSIS — M810 Age-related osteoporosis without current pathological fracture: Secondary | ICD-10-CM | POA: Diagnosis not present

## 2021-01-28 DIAGNOSIS — M353 Polymyalgia rheumatica: Secondary | ICD-10-CM | POA: Diagnosis not present

## 2021-01-28 DIAGNOSIS — Z6828 Body mass index (BMI) 28.0-28.9, adult: Secondary | ICD-10-CM | POA: Diagnosis not present

## 2021-01-28 DIAGNOSIS — E663 Overweight: Secondary | ICD-10-CM | POA: Diagnosis not present

## 2021-02-09 ENCOUNTER — Other Ambulatory Visit: Payer: Self-pay

## 2021-02-09 ENCOUNTER — Ambulatory Visit (HOSPITAL_COMMUNITY)
Admission: RE | Admit: 2021-02-09 | Discharge: 2021-02-09 | Disposition: A | Discharge status: Home / Self Care | Payer: Medicare Other | Source: Ambulatory Visit | Attending: Internal Medicine | Admitting: Internal Medicine

## 2021-02-09 DIAGNOSIS — Z780 Asymptomatic menopausal state: Secondary | ICD-10-CM | POA: Diagnosis not present

## 2021-02-09 DIAGNOSIS — M8588 Other specified disorders of bone density and structure, other site: Secondary | ICD-10-CM | POA: Diagnosis not present

## 2021-02-09 DIAGNOSIS — M810 Age-related osteoporosis without current pathological fracture: Secondary | ICD-10-CM | POA: Diagnosis not present

## 2021-03-30 DIAGNOSIS — Z20822 Contact with and (suspected) exposure to covid-19: Secondary | ICD-10-CM | POA: Diagnosis not present

## 2021-04-07 DIAGNOSIS — E7800 Pure hypercholesterolemia, unspecified: Secondary | ICD-10-CM | POA: Diagnosis not present

## 2021-04-07 DIAGNOSIS — D509 Iron deficiency anemia, unspecified: Secondary | ICD-10-CM | POA: Diagnosis not present

## 2021-04-07 DIAGNOSIS — M353 Polymyalgia rheumatica: Secondary | ICD-10-CM | POA: Diagnosis not present

## 2021-04-07 DIAGNOSIS — G301 Alzheimer's disease with late onset: Secondary | ICD-10-CM | POA: Diagnosis not present

## 2021-04-07 DIAGNOSIS — Z23 Encounter for immunization: Secondary | ICD-10-CM | POA: Diagnosis not present

## 2021-04-07 DIAGNOSIS — I10 Essential (primary) hypertension: Secondary | ICD-10-CM | POA: Diagnosis not present

## 2021-04-07 DIAGNOSIS — D649 Anemia, unspecified: Secondary | ICD-10-CM | POA: Diagnosis not present

## 2021-04-07 DIAGNOSIS — Z79899 Other long term (current) drug therapy: Secondary | ICD-10-CM | POA: Diagnosis not present

## 2021-04-07 DIAGNOSIS — F325 Major depressive disorder, single episode, in full remission: Secondary | ICD-10-CM | POA: Diagnosis not present

## 2021-04-07 DIAGNOSIS — F0280 Dementia in other diseases classified elsewhere without behavioral disturbance: Secondary | ICD-10-CM | POA: Diagnosis not present

## 2021-05-25 DIAGNOSIS — Z20822 Contact with and (suspected) exposure to covid-19: Secondary | ICD-10-CM | POA: Diagnosis not present

## 2021-05-29 DIAGNOSIS — Z20822 Contact with and (suspected) exposure to covid-19: Secondary | ICD-10-CM | POA: Diagnosis not present

## 2021-07-10 DIAGNOSIS — Z20822 Contact with and (suspected) exposure to covid-19: Secondary | ICD-10-CM | POA: Diagnosis not present

## 2021-07-27 DIAGNOSIS — Z20822 Contact with and (suspected) exposure to covid-19: Secondary | ICD-10-CM | POA: Diagnosis not present

## 2021-07-29 DIAGNOSIS — Z6827 Body mass index (BMI) 27.0-27.9, adult: Secondary | ICD-10-CM | POA: Diagnosis not present

## 2021-07-29 DIAGNOSIS — M1991 Primary osteoarthritis, unspecified site: Secondary | ICD-10-CM | POA: Diagnosis not present

## 2021-07-29 DIAGNOSIS — M797 Fibromyalgia: Secondary | ICD-10-CM | POA: Diagnosis not present

## 2021-07-29 DIAGNOSIS — M810 Age-related osteoporosis without current pathological fracture: Secondary | ICD-10-CM | POA: Diagnosis not present

## 2021-07-29 DIAGNOSIS — M353 Polymyalgia rheumatica: Secondary | ICD-10-CM | POA: Diagnosis not present

## 2021-07-29 DIAGNOSIS — E663 Overweight: Secondary | ICD-10-CM | POA: Diagnosis not present

## 2021-08-10 DIAGNOSIS — M810 Age-related osteoporosis without current pathological fracture: Secondary | ICD-10-CM | POA: Diagnosis not present

## 2021-08-28 DIAGNOSIS — Z20822 Contact with and (suspected) exposure to covid-19: Secondary | ICD-10-CM | POA: Diagnosis not present

## 2021-09-08 DIAGNOSIS — Z20822 Contact with and (suspected) exposure to covid-19: Secondary | ICD-10-CM | POA: Diagnosis not present

## 2021-09-11 DIAGNOSIS — Z20822 Contact with and (suspected) exposure to covid-19: Secondary | ICD-10-CM | POA: Diagnosis not present

## 2021-10-05 DIAGNOSIS — I10 Essential (primary) hypertension: Secondary | ICD-10-CM | POA: Diagnosis not present

## 2021-10-05 DIAGNOSIS — Z79899 Other long term (current) drug therapy: Secondary | ICD-10-CM | POA: Diagnosis not present

## 2021-10-05 DIAGNOSIS — G301 Alzheimer's disease with late onset: Secondary | ICD-10-CM | POA: Diagnosis not present

## 2021-10-05 DIAGNOSIS — F0280 Dementia in other diseases classified elsewhere without behavioral disturbance: Secondary | ICD-10-CM | POA: Diagnosis not present

## 2021-12-08 ENCOUNTER — Encounter: Payer: Self-pay | Admitting: Family Medicine

## 2021-12-08 ENCOUNTER — Ambulatory Visit (INDEPENDENT_AMBULATORY_CARE_PROVIDER_SITE_OTHER): Payer: Medicare Other | Admitting: Family Medicine

## 2021-12-08 VITALS — BP 107/63 | HR 65 | Temp 97.2°F | Ht 63.0 in | Wt 149.8 lb

## 2021-12-08 DIAGNOSIS — F411 Generalized anxiety disorder: Secondary | ICD-10-CM | POA: Diagnosis not present

## 2021-12-08 DIAGNOSIS — Z8673 Personal history of transient ischemic attack (TIA), and cerebral infarction without residual deficits: Secondary | ICD-10-CM | POA: Diagnosis not present

## 2021-12-08 DIAGNOSIS — M797 Fibromyalgia: Secondary | ICD-10-CM | POA: Diagnosis not present

## 2021-12-08 DIAGNOSIS — M353 Polymyalgia rheumatica: Secondary | ICD-10-CM

## 2021-12-08 DIAGNOSIS — I10 Essential (primary) hypertension: Secondary | ICD-10-CM | POA: Diagnosis not present

## 2021-12-08 DIAGNOSIS — G301 Alzheimer's disease with late onset: Secondary | ICD-10-CM | POA: Diagnosis not present

## 2021-12-08 DIAGNOSIS — F02A0 Dementia in other diseases classified elsewhere, mild, without behavioral disturbance, psychotic disturbance, mood disturbance, and anxiety: Secondary | ICD-10-CM

## 2021-12-08 MED ORDER — BUSPIRONE HCL 15 MG PO TABS: 15.0000 mg | ORAL_TABLET | Freq: Two times a day (BID) | ORAL | 1 refills | Status: DC

## 2021-12-08 NOTE — Patient Instructions (Signed)
Discontinue Lisinopril.  BP too low. Let me know if she is DNR or not and will fill out form Buspar increased to '15mg'$  twice daily for nerves.

## 2021-12-08 NOTE — Progress Notes (Signed)
Subjective: EX:HBZJIRCVE care, GAD, PMR HPI: Eileen Jennings is a 81 y.o. female who is brought to the office by her daughter-in-law.  She is presenting to clinic today for:  1. 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  She is currently treated with BuSpar 10 mg twice daily.  Her daughter-in-law thinks that she might feel that it benefit from a little bit higher of a dose since she continues to have some anxiety symptoms that prohibit her from doing things like she would normally do.  She does note that patient has a history of dependence disorder and does not wish to have anything that may be potentially habit-forming given to the patient.  She is required rehabilitation in the past.  She has known late onset Alzheimer's dementia. Her medications are managed by her daughter-in-law.  Both the patient and her husband reside independently but he does all of the driving.  She does wear depends as she suffers from urinary incontinence.  They noticed some urinary odor but patient denies any dysuria, change in urinary frequency, urgency.  No mental status changes.  No new back pain or fevers.  No hematuria.  She is compliant with her Lipitor, Namenda, metoprolol and lisinopril.  Do not monitor blood pressures at home but no reports of loss of consciousness, chest pain or shortness of breath.    2. Fibromyalgia/ Polymyalgia rheumatica She uses a walker at baseline due to fibromyalgia.  She also has polymyalgia rheumatica and is treated with both gabapentin and chronic prednisone.  She sees Dr. Amil Amen for her rheumatologic care  Past Medical History:  Diagnosis Date   Anemia    comes and goes   Anxiety    takes Ativan   Arthritis    Hx osteoarthritis   Blood transfusion age 35   tonsillectomy as child   Depression    takes Cymbalta   Drug overdose 08/01/2013   Fibromyalgia    pt. had polymyalgia  not fibromyalgia   Headache(784.0)    takes Goody powders   Hypertension    takes Metoprolol   PONV (postoperative nausea and vomiting)    UTI (lower urinary tract infection) 08/01/2013   Past Surgical History:  Procedure Laterality Date   ABDOMINAL HYSTERECTOMY  20 yrs. ago   COLONOSCOPY WITH PROPOFOL N/A 01/30/2013   Procedure: COLONOSCOPY WITH PROPOFOL;  Surgeon: Garlan Fair, MD;  Location: [facility];  Service: Endoscopy;  Laterality: N/A;   CYSTOCELE REPAIR  age 83 with rectocele   ESOPHAGOGASTRODUODENOSCOPY (EGD) WITH PROPOFOL N/A 01/30/2013   Procedure: ESOPHAGOGASTRODUODENOSCOPY (EGD) WITH PROPOFOL;  Surgeon: Garlan Fair, MD;  Location: [facility];  Service: Endoscopy;  Laterality: N/A;   INCONTINENCE SURGERY  4 yrs. ago   RECTOCELE REPAIR  age 80   TONSILLECTOMY     age 89   TOTAL KNEE ARTHROPLASTY  03/31/2011   Procedure: TOTAL KNEE ARTHROPLASTY;  Surgeon: Dione Plover Aluisio;  Location: [facility];  Service: Orthopedics;  Laterality: Left;   Social History   Socioeconomic History   Marital status: Married    Spouse name: Not on file   Number of children: Not on file   Years of education: Not on file   Highest education level: Not on file  Occupational History   Not on file  Tobacco Use   Smoking status: Former    Packs/day: 0.50    Years: 8.00    Total pack  years: 4.00    Types: Cigarettes    Quit date: 03/29/1971    Years since quitting: 50.7   Smokeless tobacco: Never  Vaping Use   Vaping Use: Never used  Substance and Sexual Activity   Alcohol use: No   Drug use: No   Sexual activity: Never  Other Topics Concern   Not on file  Social History Narrative   Not on file   Social Determinants of Health   Financial Resource Strain: Not on file  Food Insecurity: Not on file  Transportation Needs: Not on file  Physical Activity: Not on file  Stress: Not on file  Social Connections: Not on file  Intimate Partner Violence: Not on file   No outpatient  medications have been marked as taking for the 12/08/21 encounter (Office Visit) with Janora Norlander, DO.   Family History  Problem Relation Age of Onset   CAD Father    Depression Father    Stroke Other    Drug abuse Mother        prescription drug abuse   Breast cancer Neg Hx    Allergies  Allergen Reactions   Hydrocodone-Acetaminophen Nausea And Vomiting   ROS: Per HPI  Objective: Office vital signs reviewed. BP 107/63   Pulse 65   Temp (!) 97.2 F (36.2 C)   Ht '5\' 3"'$  (1.6 m)   Wt 149 lb 12.8 oz (67.9 kg)   SpO2 93%   BMI 26.54 kg/m   Physical Examination:  General: Awake, alert, well nourished, well-appearing elderly female, No acute distress HEENT: Sclera white.  Moist mucous membranes Cardio: regular rate and rhythm, S1S2 heard, no murmurs appreciated Pulm: clear to auscultation bilaterally, no wheezes, rhonchi or rales; normal work of breathing on room air Extremities: warm, well perfused, No edema, cyanosis or clubbing; +2 pulses bilaterally MSK: Slow gait and station.  Requires assistance for ambulation.  Utilizing rolling walker Skin: dry; intact; no rashes or lesions Neuro: No tremor.  Follows commands.  Speech normal.  Assessment/ Plan: 81 y.o. female   Polymyalgia rheumatica ([place])  Fibromyalgia  Primary hypertension  History of CVA in adulthood  Generalized anxiety disorder - Plan: busPIRone (BUSPAR) 15 MG tablet  Mild late onset Alzheimer's dementia without behavioral disturbance, psychotic disturbance, mood disturbance, or anxiety (HCC)  PMR chronic and stable.  Continue to follow-up with Dr. Amil Amen as scheduled.  ROI completed for labs and notes  Continue gabapentin for fibromyalgia.  Okay to send refill request to me if needed  Blood pressure is too low in my opinion for advanced age so she will discontinue lisinopril and continue metoprolol as prescribed.  We will await lab results from specialist but if no recent labs we will plan to  get these checked with her next visit  Anxiety disorder is not well controlled so advance the BuSpar to 15 mg twice daily.  New Rx sent  Alzheimer's dementia is chronic and stable.  Continue Namenda.  We will gladly refill scription's as needed.  Release of information form completed for Dr. Carlyle Lipa office.  Plan for MMSE at next visit  Janora Norlander, [place] [phone]

## 2022-02-09 DIAGNOSIS — M810 Age-related osteoporosis without current pathological fracture: Secondary | ICD-10-CM | POA: Diagnosis not present

## 2022-02-09 DIAGNOSIS — M1991 Primary osteoarthritis, unspecified site: Secondary | ICD-10-CM | POA: Diagnosis not present

## 2022-02-09 DIAGNOSIS — Z6825 Body mass index (BMI) 25.0-25.9, adult: Secondary | ICD-10-CM | POA: Diagnosis not present

## 2022-02-09 DIAGNOSIS — M797 Fibromyalgia: Secondary | ICD-10-CM | POA: Diagnosis not present

## 2022-02-09 DIAGNOSIS — M353 Polymyalgia rheumatica: Secondary | ICD-10-CM | POA: Diagnosis not present

## 2022-02-09 DIAGNOSIS — E663 Overweight: Secondary | ICD-10-CM | POA: Diagnosis not present

## 2022-03-22 ENCOUNTER — Ambulatory Visit (INDEPENDENT_AMBULATORY_CARE_PROVIDER_SITE_OTHER): Payer: Medicare Other

## 2022-03-22 VITALS — Ht 62.0 in | Wt 140.0 lb

## 2022-03-22 DIAGNOSIS — Z0000 Encounter for general adult medical examination without abnormal findings: Secondary | ICD-10-CM

## 2022-03-22 NOTE — Progress Notes (Signed)
Subjective:   Eileen Jennings is a 81 y.o. female who presents for Medicare Annual (Subsequent) preventive examination.   I connected with  Conni Elliot on 03/22/22 by a audio enabled telemedicine application and verified that I am speaking with the correct person using two identifiers.  Patient Location: Home  Provider Location: Home Office  I discussed the limitations of evaluation and management by telemedicine. The patient expressed understanding and agreed to proceed.  Review of Systems     Cardiac Risk Factors include: advanced age (>27mn, >>21women)     Objective:    Today's Vitals   03/22/22 1035  Weight: 140 lb (63.5 kg)  Height: '5\' 2"'$  (1.575 m)   Body mass index is 25.61 kg/m.     03/22/2022   10:39 AM 02/07/2018    1:12 PM 08/01/2013    9:54 AM 01/12/2013    4:24 PM 04/01/2011   10:29 PM 03/31/2011   10:51 AM 03/29/2011   11:45 AM  Advanced Directives  Does Patient Have a Medical Advance Directive? Yes No Patient does not have advance directive;Patient would not like information Patient does not have advance directive;Patient would not like information Patient does not have advance directive Patient does not have advance directive Patient does not have advance directive;Patient would not like information  Type of AScientist, forensicPower of AE[place]Living will        Copy of [place] Chart? No - copy requested        Pre-existing out of facility DNR order (yellow form or pink MOST form)   No  No      Current Medications (verified) Outpatient Encounter Medications as of 03/22/2022  Medication Sig   atorvastatin (LIPITOR) 80 MG tablet Take 80 mg by mouth daily.   B Complex Vitamins (VITAMIN B COMPLEX PO) Take by mouth.   busPIRone (BUSPAR) 15 MG tablet Take 1 tablet (15 mg total) by mouth 2 (two) times daily. For anxiety   cholecalciferol (VITAMIN D3) 25 MCG (1000 UNIT) tablet Take 1,000 Units by mouth daily.   ferrous sulfate  325 (65 FE) MG tablet Take 325 mg by mouth daily with breakfast.   gabapentin (NEURONTIN) 300 MG capsule Take 300 mg by mouth 2 (two) times daily.    Melatonin 3 MG CAPS Take 1 capsule (3 mg total) by mouth at bedtime as needed (insomnia).   memantine (NAMENDA) 10 MG tablet Take 10 mg by mouth 2 (two) times daily.   metoprolol succinate (TOPROL-XL) 100 MG 24 hr tablet Take 100 mg by mouth every evening.    predniSONE (DELTASONE) 5 MG tablet Take 5 mg by mouth every morning.    No facility-administered encounter medications on file as of 03/22/2022.    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 ago   COLONOSCOPY  WITH PROPOFOL N/A 01/30/2013   Procedure: COLONOSCOPY WITH PROPOFOL;  Surgeon: Garlan Fair, MD;  Location: [facility];  Service: Endoscopy;  Laterality: N/A;   CYSTOCELE REPAIR  age 48 with rectocele   ESOPHAGOGASTRODUODENOSCOPY (EGD) WITH PROPOFOL N/A 01/30/2013   Procedure: ESOPHAGOGASTRODUODENOSCOPY (EGD) WITH PROPOFOL;  Surgeon: Garlan Fair, MD;  Location: [facility];  Service: Endoscopy;  Laterality: N/A;   INCONTINENCE SURGERY  4 yrs. ago   RECTOCELE REPAIR  age 53   TONSILLECTOMY     age 61   TOTAL KNEE ARTHROPLASTY  03/31/2011   Procedure: TOTAL KNEE ARTHROPLASTY;  Surgeon: Dione Plover Aluisio;  Location: [facility];  Service: Orthopedics;  Laterality: Left;   Family History  Problem Relation Age of Onset    CAD Father    Depression Father    Stroke Other    Drug abuse Mother        prescription drug abuse   Breast cancer Neg Hx    Social History   Socioeconomic History   Marital status: Married    Spouse name: Not on file   Number of children: Not on file   Years of education: Not on file   Highest education level: Not on file  Occupational History   Not on file  Tobacco Use   Smoking status: Former    Packs/day: 0.50    Years: 8.00    Total pack years: 4.00    Types: Cigarettes    Quit date: 03/29/1971    Years since quitting: 51.0   Smokeless tobacco: Never  Vaping Use   Vaping Use: Never used  Substance and Sexual Activity   Alcohol use: No   Drug use: No   Sexual activity: Never  Other Topics Concern   Not on file  Social History Narrative   Not on file   Social Determinants of Health   Financial Resource Strain: Low Risk  (03/22/2022)   Overall Financial Resource Strain (CARDIA)    Difficulty of Paying Living Expenses: Not hard at all  Food Insecurity: No Food Insecurity (03/22/2022)   Hunger Vital Sign    Worried About Running Out of Food in the Last Year: Never true    [place] in the Last Year: Never true  Transportation Needs: No Transportation Needs (03/22/2022)   PRAPARE - Hydrologist (Medical): No    Lack of Transportation (Non-Medical): No  Physical Activity: Inactive (03/22/2022)   Exercise Vital Sign    Days of Exercise per Week: 0 days    Minutes of Exercise per Session: 0 min  Stress: No Stress Concern Present (03/22/2022)   [place]    Feeling of Stress : Not at all  Social Connections: Moderately Integrated (03/22/2022)   Social Connection and Isolation Panel [NHANES]    Frequency of Communication with Friends and Family: More than three times a week    Frequency of Social Gatherings with Friends and Family: More than three times a week     Attends Religious Services: More than 4 times per year    Active Member of [employer] or Organizations: No    Attends Archivist Meetings: Never    Marital Status: Married    Tobacco Counseling Counseling given: Not Answered   Clinical Intake:  Pre-visit preparation completed: Yes  Pain : No/denies pain     Nutritional Risks: None Diabetes: No  How often do you need to have someone  help you when you read instructions, pamphlets, or other written materials from your doctor or pharmacy?: 1 - Never  Diabetic?no   Interpreter Needed?: No  Information entered by :: Jadene Pierini, LPN   Activities of Daily Living    03/22/2022   10:39 AM  In your present state of health, do you have any difficulty performing the following activities:  Hearing? 0  Vision? 0  Difficulty concentrating or making decisions? 0  Walking or climbing stairs? 0  Dressing or bathing? 0  Doing errands, shopping? 0  Preparing Food and eating ? N  Using the Toilet? N  In the past six months, have you accidently leaked urine? N  Do you have problems with loss of bowel control? N  Managing your Medications? N  Managing your Finances? N  Housekeeping or managing your Housekeeping? N    Patient Care Team: Janora Norlander, DO as PCP - General (Family Medicine)  Indicate any recent Medical Services you may have received from other than [facility] providers in the past year (date may be approximate).     Assessment:   This is a routine wellness examination for Dreya.  Hearing/Vision screen Vision Screening - Comments:: Annual eye doctors exams wear glasses   Dietary issues and exercise activities discussed: Current Exercise Habits: The patient does not participate in regular exercise at present, Exercise limited by: neurologic condition(s)   Goals Addressed             This Visit's Progress    Exercise 3x per week (30 min per time)         Depression Screen    03/22/2022    10:38 AM 12/08/2021    1:51 PM  PHQ 2/9 Scores  PHQ - 2 Score 0 1  PHQ- 9 Score  5    Fall Risk    03/22/2022   10:35 AM 12/08/2021    1:51 PM  [place] in the past year? 0 0  Number falls in past yr: 0   Injury with Fall? 0   Risk for fall due to : No Fall Risks   Follow up Falls prevention discussed     [place]:  Any stairs in or around the home? No  If so, are there any without handrails? No  Home free of loose throw rugs in walkways, pet beds, electrical cords, etc? Yes  Adequate lighting in your home to reduce risk of falls? Yes   ASSISTIVE DEVICES UTILIZED TO PREVENT FALLS:  Life alert? No  Use of a cane, walker or w/c? No  Grab bars in the bathroom? Yes  Shower chair or bench in shower? No  Elevated toilet seat or a handicapped toilet? Yes        03/22/2022   10:39 AM  6CIT Screen  What Year? 4 points  What month? 0 points  Count back from 20 2 points  Months in reverse 2 points  Repeat phrase 4 points    Immunizations Immunization History  Administered Date(s) Administered   Influenza Split 04/21/2011, 02/27/2012, 03/12/2014, 02/26/2015, 02/23/2016, 02/21/2017, 04/04/2019   Influenza, High Dose Seasonal PF 03/17/2018, 04/07/2021   Influenza,inj,quad, With Preservative 03/11/2017   Moderna Sars-Covid-2 Vaccination 07/19/2019, 08/15/2019   Pneumococcal Conjugate-13 11/20/2013   Pneumococcal Polysaccharide-23 09/30/2004   Td 06/26/2001   Tdap 07/22/2011   Zoster, Live 01/19/2008, 04/07/2021, 10/05/2021    TDAP status: Due, Education has been provided regarding the importance of this vaccine.  Advised may receive this vaccine at local pharmacy or Health Dept. Aware to provide a copy of the vaccination record if obtained from local pharmacy or Health Dept. Verbalized acceptance and understanding.  Flu Vaccine status: Due, Education has been provided regarding the importance of this vaccine. Advised may receive  this vaccine at local pharmacy or Health Dept. Aware to provide a copy of the vaccination record if obtained from local pharmacy or Health Dept. Verbalized acceptance and understanding.  Pneumococcal vaccine status: Up to date  Covid-19 vaccine status: Completed vaccines  Qualifies for Shingles Vaccine? Yes   Zostavax completed No   Shingrix Completed?: No.    Education has been provided regarding the importance of this vaccine. Patient has been advised to call insurance company to determine out of pocket expense if they have not yet received this vaccine. Advised may also receive vaccine at local pharmacy or Health Dept. Verbalized acceptance and understanding.  Screening Tests Health Maintenance  Topic Date Due   Zoster Vaccines- Shingrix (1 of 2) Never done   COVID-19 Vaccine (3 - Moderna risk series) 09/12/2019   INFLUENZA VACCINE  12/22/2021   Pneumonia Vaccine 47+ Years old (3 - PPSV23 or PCV20) 12/09/2022 (Originally 11/21/2014)   TETANUS/TDAP  12/09/2022 (Originally 07/21/2021)   Medicare Annual Wellness (AWV)  03/23/2023   DEXA SCAN  Completed   HPV VACCINES  Aged Out    Health Maintenance  Health Maintenance Due  Topic Date Due   Zoster Vaccines- Shingrix (1 of 2) Never done   COVID-19 Vaccine (3 - Moderna risk series) 09/12/2019   INFLUENZA VACCINE  12/22/2021    Colorectal cancer screening: No longer required.   Mammogram status: No longer required due to age.  Bone Density status: Completed 02/09/2021. Results reflect: Bone density results: OSTEOPENIA. Repeat every 5 years.  Lung Cancer Screening: (Low Dose CT Chest recommended if Age 72-80 years, 30 pack-year currently smoking OR have quit w/in 15years.) does not qualify.   Lung Cancer Screening Referral: n/a  Additional Screening:  Hepatitis C Screening: does not qualify;   Vision Screening: Recommended annual ophthalmology exams for early detection of glaucoma and other disorders of the eye. Is the patient  up to date with their annual eye exam?  Yes  Who is the provider or what is the name of the office in which the patient attends annual eye exams? [facility]  If pt is not established with a provider, would they like to be referred to a provider to establish care? No .   Dental Screening: Recommended annual dental exams for proper oral hygiene  Community Resource Referral / Chronic Care Management: CRR required this visit?  No   CCM required this visit?  No      Plan:     I have personally reviewed and noted the following in the patient's chart:   Medical and social history Use of alcohol, tobacco or illicit drugs  Current medications and supplements including opioid prescriptions. Patient is not currently taking opioid prescriptions. Functional ability and status Nutritional status Physical activity Advanced directives List of other physicians Hospitalizations, surgeries, and ER visits in previous 12 months Vitals Screenings to include cognitive, depression, and falls Referrals and appointments  In addition, I have reviewed and discussed with patient certain preventive protocols, quality metrics, and best practice recommendations. A written personalized care plan for preventive services as well as general preventive health recommendations were provided to patient.     Daphane Shepherd, LPN   03/88/[id number]  Nurse Notes: none

## 2022-03-22 NOTE — Patient Instructions (Signed)
Ms. Hamler , Thank you for taking time to come for your Medicare Wellness Visit. I appreciate your ongoing commitment to your health goals. Please review the following plan we discussed and let me know if I can assist you in the future.   These are the goals we discussed:  Goals      Exercise 3x per week (30 min per time)        This is a list of the screening recommended for you and due dates:  Health Maintenance  Topic Date Due   Zoster (Shingles) Vaccine (1 of 2) Never done   COVID-19 Vaccine (3 - Moderna risk series) 09/12/2019   Flu Shot  12/22/2021   Pneumonia Vaccine (3 - PPSV23 or PCV20) 12/09/2022*   Tetanus Vaccine  12/09/2022*   Medicare Annual Wellness Visit  03/23/2023   DEXA scan (bone density measurement)  Completed   HPV Vaccine  Aged Out  *Topic was postponed. The date shown is not the original due date.    Advanced directives: Please bring a copy of your health care power of attorney and living will to the office to be added to your chart at your convenience.   Conditions/risks identified: Aim for 30 minutes of exercise or brisk walking, 6-8 glasses of water, and 5 servings of fruits and vegetables each day.   Next appointment: Follow up in one year for your annual wellness visit    Preventive Care 65 Years and Older, Female Preventive care refers to lifestyle choices and visits with your health care provider that can promote health and wellness. What does preventive care include? A yearly physical exam. This is also called an annual well check. Dental exams once or twice a year. Routine eye exams. Ask your health care provider how often you should have your eyes checked. Personal lifestyle choices, including: Daily care of your teeth and gums. Regular physical activity. Eating a healthy diet. Avoiding tobacco and drug use. Limiting alcohol use. Practicing safe sex. Taking low-dose aspirin every day. Taking vitamin and mineral supplements as recommended  by your health care provider. What happens during an annual well check? The services and screenings done by your health care provider during your annual well check will depend on your age, overall health, lifestyle risk factors, and family history of disease. Counseling  Your health care provider may ask you questions about your: Alcohol use. Tobacco use. Drug use. Emotional well-being. Home and relationship well-being. Sexual activity. Eating habits. History of falls. Memory and ability to understand (cognition). Work and work Statistician. Reproductive health. Screening  You may have the following tests or measurements: Height, weight, and BMI. Blood pressure. Lipid and cholesterol levels. These may be checked every 5 years, or more frequently if you are over 22 years old. Skin check. Lung cancer screening. You may have this screening every year starting at age 29 if you have a 30-pack-year history of smoking and currently smoke or have quit within the past 15 years. Fecal occult blood test (FOBT) of the stool. You may have this test every year starting at age 51. Flexible sigmoidoscopy or colonoscopy. You may have a sigmoidoscopy every 5 years or a colonoscopy every 10 years starting at age 44. Hepatitis C blood test. Hepatitis B blood test. Sexually transmitted disease (STD) testing. Diabetes screening. This is done by checking your blood sugar (glucose) after you have not eaten for a while (fasting). You may have this done every 1-3 years. Bone density scan. This is done to  screen for osteoporosis. You may have this done starting at age 15. Mammogram. This may be done every 1-2 years. Talk to your health care provider about how often you should have regular mammograms. Talk with your health care provider about your test results, treatment options, and if necessary, the need for more tests. Vaccines  Your health care provider may recommend certain vaccines, such as: Influenza  vaccine. This is recommended every year. Tetanus, diphtheria, and acellular pertussis (Tdap, Td) vaccine. You may need a Td booster every 10 years. Zoster vaccine. You may need this after age 67. Pneumococcal 13-valent conjugate (PCV13) vaccine. One dose is recommended after age 40. Pneumococcal polysaccharide (PPSV23) vaccine. One dose is recommended after age 62. Talk to your health care provider about which screenings and vaccines you need and how often you need them. This information is not intended to replace advice given to you by your health care provider. Make sure you discuss any questions you have with your health care provider. Document Released: 06/06/2015 Document Revised: 01/28/2016 Document Reviewed: 03/11/2015 Elsevier Interactive Patient Education  2017 [employer] Prevention in the Home Falls can cause injuries. They can happen to people of all ages. There are many things you can do to make your home safe and to help prevent falls. What can I do on the outside of my home? Regularly fix the edges of walkways and driveways and fix any cracks. Remove anything that might make you trip as you walk through a door, such as a raised step or threshold. Trim any bushes or trees on the path to your home. Use bright outdoor lighting. Clear any walking paths of anything that might make someone trip, such as rocks or tools. Regularly check to see if handrails are loose or broken. Make sure that both sides of any steps have handrails. Any raised decks and porches should have guardrails on the edges. Have any leaves, snow, or ice cleared regularly. Use sand or salt on walking paths during winter. Clean up any spills in your garage right away. This includes oil or grease spills. What can I do in the bathroom? Use night lights. Install grab bars by the toilet and in the tub and shower. Do not use towel bars as grab bars. Use non-skid mats or decals in the tub or shower. If you  need to sit down in the shower, use a plastic, non-slip stool. Keep the floor dry. Clean up any water that spills on the floor as soon as it happens. Remove soap buildup in the tub or shower regularly. Attach bath mats securely with double-sided non-slip rug tape. Do not have throw rugs and other things on the floor that can make you trip. What can I do in the bedroom? Use night lights. Make sure that you have a light by your bed that is easy to reach. Do not use any sheets or blankets that are too big for your bed. They should not hang down onto the floor. Have a firm chair that has side arms. You can use this for support while you get dressed. Do not have throw rugs and other things on the floor that can make you trip. What can I do in the kitchen? Clean up any spills right away. Avoid walking on wet floors. Keep items that you use a lot in easy-to-reach places. If you need to reach something above you, use a strong step stool that has a grab bar. Keep electrical cords out of the way.  Do not use floor polish or wax that makes floors slippery. If you must use wax, use non-skid floor wax. Do not have throw rugs and other things on the floor that can make you trip. What can I do with my stairs? Do not leave any items on the stairs. Make sure that there are handrails on both sides of the stairs and use them. Fix handrails that are broken or loose. Make sure that handrails are as long as the stairways. Check any carpeting to make sure that it is firmly attached to the stairs. Fix any carpet that is loose or worn. Avoid having throw rugs at the top or bottom of the stairs. If you do have throw rugs, attach them to the floor with carpet tape. Make sure that you have a light switch at the top of the stairs and the bottom of the stairs. If you do not have them, ask someone to add them for you. What else can I do to help prevent falls? Wear shoes that: Do not have high heels. Have rubber  bottoms. Are comfortable and fit you well. Are closed at the toe. Do not wear sandals. If you use a stepladder: Make sure that it is fully opened. Do not climb a closed stepladder. Make sure that both sides of the stepladder are locked into place. Ask someone to hold it for you, if possible. Clearly mark and make sure that you can see: Any grab bars or handrails. First and last steps. Where the edge of each step is. Use tools that help you move around (mobility aids) if they are needed. These include: Canes. Walkers. Scooters. Crutches. Turn on the lights when you go into a dark area. Replace any light bulbs as soon as they burn out. Set up your furniture so you have a clear path. Avoid moving your furniture around. If any of your floors are uneven, fix them. If there are any pets around you, be aware of where they are. Review your medicines with your doctor. Some medicines can make you feel dizzy. This can increase your chance of falling. Ask your doctor what other things that you can do to help prevent falls. This information is not intended to replace advice given to you by your health care provider. Make sure you discuss any questions you have with your health care provider. Document Released: 03/06/2009 Document Revised: 10/16/2015 Document Reviewed: 06/14/2014 Elsevier Interactive Patient Education  2017 [employer].

## 2022-04-12 ENCOUNTER — Ambulatory Visit: Payer: Medicare Other | Admitting: Family Medicine

## 2022-04-20 ENCOUNTER — Encounter: Payer: Self-pay | Admitting: Family Medicine

## 2022-04-20 ENCOUNTER — Ambulatory Visit (INDEPENDENT_AMBULATORY_CARE_PROVIDER_SITE_OTHER): Payer: Medicare Other | Admitting: Family Medicine

## 2022-04-20 VITALS — BP 104/66 | HR 66 | Temp 97.4°F | Ht 62.0 in | Wt 149.6 lb

## 2022-04-20 DIAGNOSIS — F411 Generalized anxiety disorder: Secondary | ICD-10-CM | POA: Diagnosis not present

## 2022-04-20 DIAGNOSIS — H6123 Impacted cerumen, bilateral: Secondary | ICD-10-CM | POA: Diagnosis not present

## 2022-04-20 DIAGNOSIS — Z131 Encounter for screening for diabetes mellitus: Secondary | ICD-10-CM | POA: Diagnosis not present

## 2022-04-20 DIAGNOSIS — Z23 Encounter for immunization: Secondary | ICD-10-CM

## 2022-04-20 DIAGNOSIS — I10 Essential (primary) hypertension: Secondary | ICD-10-CM | POA: Diagnosis not present

## 2022-04-20 DIAGNOSIS — M353 Polymyalgia rheumatica: Secondary | ICD-10-CM

## 2022-04-20 DIAGNOSIS — R739 Hyperglycemia, unspecified: Secondary | ICD-10-CM | POA: Diagnosis not present

## 2022-04-20 LAB — BAYER DCA HB A1C WAIVED: HB A1C (BAYER DCA - WAIVED): 6.1 % — ABNORMAL HIGH (ref 4.8–5.6)

## 2022-04-20 MED ORDER — MEMANTINE HCL 10 MG PO TABS: 10.0000 mg | ORAL_TABLET | Freq: Two times a day (BID) | ORAL | 3 refills | Status: DC

## 2022-04-20 MED ORDER — GABAPENTIN 300 MG PO CAPS: 300.0000 mg | ORAL_CAPSULE | Freq: Three times a day (TID) | ORAL | 3 refills | Status: DC

## 2022-04-20 MED ORDER — ATORVASTATIN CALCIUM 80 MG PO TABS: 80.0000 mg | ORAL_TABLET | Freq: Every day | ORAL | 3 refills | Status: DC

## 2022-04-20 MED ORDER — METOPROLOL SUCCINATE ER 100 MG PO TB24: 100.0000 mg | ORAL_TABLET | Freq: Every evening | ORAL | 3 refills | Status: DC

## 2022-04-20 MED ORDER — BUSPIRONE HCL 15 MG PO TABS: 15.0000 mg | ORAL_TABLET | Freq: Two times a day (BID) | ORAL | 1 refills | Status: DC

## 2022-04-20 NOTE — Progress Notes (Signed)
I have separately seen and examined the patient. I have discussed the findings and exam with student Dr Eileen Jennings and agree with the below note.  My changes/additions are outlined in BLUE.    S: Patient is brought to the office by her daughter and accompanied by her spouse.  They worry about her anxiety.  Some mornings she will wake up extremely anxious and have a subsequent headache.  She often will think the headache is stroke and ruminates on this.  At last visit we increased her buspirone from 10 mg to 15 mg and apparently that order was never filled by the pharmacy and she continues to only take 10 mg.  Her daughter also voices concern about personal hygiene as she thinks that her mother actually forgets to brush her teeth.  They will have an appointment with the dentist soon to talk about this further.  Concerns that she may be having some hearing difficulties and would like the ears evaluated today  O: Vitals:   04/20/22 1127  BP: 104/66  Pulse: 66  Temp: (!) 97.4 F (36.3 C)  SpO2: 96%    General appearance: alert, cooperative, appears stated age, and no distress ENT: Bilateral TMs obscured by cerumen Head: Normocephalic, without obvious abnormality, atraumatic Eyes: negative findings: lids and lashes normal, conjunctivae and sclerae normal, corneas clear, and pupils equal, round, reactive to light and accomodation Lungs: clear to auscultation bilaterally Heart: regular rate and rhythm, S1, S2 normal, no murmur, click, rub or gallop Psych: Very pleasant, interactive.  Does not appear to be responding to internal stimuli   A/P:  Generalized anxiety disorder - Plan: busPIRone (BUSPAR) 15 MG tablet, TSH  Need for immunization against influenza - Plan: Flu Vaccine QUAD High Dose(Fluad)  Polymyalgia rheumatica (HCC) - Plan: CMP14+EGFR, TSH, CBC  Primary hypertension - Plan: CMP14+EGFR, TSH  Screening for diabetes mellitus - Plan: Bayer DCA Hb A1c Waived  Bilateral impacted  cerumen  Never did increase BuSpar to 15 mg so I advised in recent her medication in efforts to alleviate some of the anxiety she has been experiencing.  Influenza vaccination administered  Check labs  Cerumen impaction identified bilaterally and we were successful with irrigating 1 ear but the other ear would not release so she will use some Debrox and return to have that repeated.  A1c does demonstrate prediabetic levels would certainly limit excess sugar in efforts to avoid progression into type 2 diabetes.   Eileen Jennings, [facility]   -------------------------------------------------------------------------------------------------------------------------------------------------------------------------------------     Subjective: CC:  PCP: Eileen Jennings, Eileen Jennings NWG:NFAO R Eileen Jennings is a 81 y.o. female presenting to clinic today for:  GAD/late onset Alzheimer's dementia  Patient is compliant with her medications, including buspirone, atorvastatin, Namenda, metoprolol, and lisinopril. Patient and her family report that she continues to experience increased anxiety. They state that the patient feels panicky every morning and informs her husband of her concerns about having a stroke. She expresses a reluctance to go to the hospital if that were the case. This pattern occurs daily and is causing concern for her family. Otherwise, the patient reports that she is doing well and does not have any concerns regarding her chronic conditions.  The patient's daughter reports that her mother remains forgetful about daily tasks, such as brushing her teeth, but does not perceive this as a significant deviation from the patient's Alzheimer's path.  The patient also reports some decreased hearing and increased wax in both ears. She would like  this addressed and the wax disimpacted today.  ROS: Per HPI  Allergies  Allergen Reactions   Hydrocodone-Acetaminophen  Nausea And Vomiting   Past Medical History:  Diagnosis Date   Anemia    comes and goes   Anxiety    takes Ativan   Arthritis    Hx osteoarthritis   Blood transfusion age 71   tonsillectomy as child   Depression    takes Cymbalta   Drug overdose 08/01/2013   Fibromyalgia    pt. had polymyalgia not fibromyalgia   Headache(784.0)    takes Goody powders   Hypertension    takes Metoprolol   PONV (postoperative nausea and vomiting)    UTI (lower urinary tract infection) 08/01/2013    Current Outpatient Medications:    B Complex Vitamins (VITAMIN B COMPLEX PO), Take by mouth., Disp: , Rfl:    cholecalciferol (VITAMIN D3) 25 MCG (1000 UNIT) tablet, Take 1,000 Units by mouth daily., Disp: , Rfl:    ferrous sulfate 325 (65 FE) MG tablet, Take 325 mg by mouth daily with breakfast., Disp: , Rfl:    Melatonin 3 MG CAPS, Take 1 capsule (3 mg total) by mouth at bedtime as needed (insomnia)., Disp: 30 capsule, Rfl: 0   predniSONE (DELTASONE) 5 MG tablet, Take 5 mg by mouth every morning. , Disp: , Rfl:    atorvastatin (LIPITOR) 80 MG tablet, Take 1 tablet (80 mg total) by mouth daily., Disp: 90 tablet, Rfl: 3   busPIRone (BUSPAR) 15 MG tablet, Take 1 tablet (15 mg total) by mouth 2 (two) times daily. For anxiety **please note change in dose!, Disp: 180 tablet, Rfl: 1   gabapentin (NEURONTIN) 300 MG capsule, Take 1 capsule (300 mg total) by mouth 3 (three) times daily., Disp: 270 capsule, Rfl: 3   memantine (NAMENDA) 10 MG tablet, Take 1 tablet (10 mg total) by mouth 2 (two) times daily., Disp: 180 tablet, Rfl: 3   metoprolol succinate (TOPROL-XL) 100 MG 24 hr tablet, Take 1 tablet (100 mg total) by mouth every evening., Disp: 90 tablet, Rfl: 3 Social History   Socioeconomic History   Marital status: Married    Spouse name: Not on file   Number of children: Not on file   Years of education: Not on file   Highest education level: Not on file  Occupational History   Not on file  Tobacco Use    Smoking status: Former    Packs/day: 0.50    Years: 8.00    Total pack years: 4.00    Types: Cigarettes    Quit date: 03/29/1971    Years since quitting: 51.0   Smokeless tobacco: Never  Vaping Use   Vaping Use: Never used  Substance and Sexual Activity   Alcohol use: No   Drug use: No   Sexual activity: Never  Other Topics Concern   Not on file  Social History Narrative   Not on file   Social Determinants of Health   Financial Resource Strain: Low Risk  (03/22/2022)   Overall Financial Resource Strain (CARDIA)    Difficulty of Paying Living Expenses: Not hard at all  Food Insecurity: No Food Insecurity (03/22/2022)   Hunger Vital Sign    Worried About Running Out of Food in the Last Year: Never true    Ran Out of Food in the Last Year: Never true  Transportation Needs: No Transportation Needs (03/22/2022)   PRAPARE - Hydrologist (Medical): No  Lack of Transportation (Non-Medical): No  Physical Activity: Inactive (03/22/2022)   Exercise Vital Sign    Days of Exercise per Week: 0 days    Minutes of Exercise per Session: 0 min  Stress: No Stress Concern Present (03/22/2022)   [place]    Feeling of Stress : Not at all  Social Connections: Moderately Integrated (03/22/2022)   Social Connection and Isolation Panel [NHANES]    Frequency of Communication with Friends and Family: More than three times a week    Frequency of Social Gatherings with Friends and Family: More than three times a week    Attends Religious Services: More than 4 times per year    Active Member of [employer] or Organizations: No    Attends Archivist Meetings: Never    Marital Status: Married  Human resources officer Violence: Not At Risk (03/22/2022)   Humiliation, Afraid, Rape, and Kick questionnaire    Fear of Current or Ex-Partner: No    Emotionally Abused: No    Physically Abused: No    Sexually  Abused: No   Family History  Problem Relation Age of Onset   CAD Father    Depression Father    Stroke Other    Drug abuse Mother        prescription drug abuse   Breast cancer Neg Hx     Objective: Office vital signs reviewed. BP 104/66   Pulse 66   Temp (!) 97.4 F (36.3 C)   Ht 5' 2" (1.575 m)   Wt 149 lb 9.6 oz (67.9 kg)   SpO2 96%   BMI 27.36 kg/m   Physical Exam General: Awake, alert, well nourished, No acute distress HEENT: Normal    Neck: No masses palpated. No lymphadenopathy    Right Ear: Decreased hearing noted. No laceration, drainage, swelling or tenderness. There is impacted cerumen.     Left Ear: Decreased hearing noted. Tenderness present. No laceration, drainage or swelling. There is impacted cerumen.     Eyes: PERRLA, extraocular membranes intact, sclera white    Nose: nasal turbinates moist, no nasal discharge     Throat: moist mucus membranes, no erythema, no tonsillar exudate.  Airway is patent Cardio: regular rate and rhythm, S1S2 heard, no murmurs appreciated Pulm: clear to auscultation bilaterally, no wheezes, rhonchi or rales; normal work of breathing on room air GI: soft, non-tender, non-distended, bowel sounds present x4, no hepatomegaly, no splenomegaly, no masses Extremities: warm, well perfused, No edema, cyanosis or clubbing; +1 pulses bilaterally Skin: dry; intact; no rashes or lesions  Assessment/ Plan: 81 y.o. female   Patient acknowledges that she continues to have increased anxiety. She reports that she is concerned about increasing her anxiety medications due to her personal and family history of dependence disorder. Counseled patient extensively about the role of medications in improving mental health symptoms. Patient and her family expressed understanding. From reviewing the patient's chart, it appears that we attempted to increase the patient's buspirone at her last appointment; upon reviewing the patient's medications, I understand  that this was not increased by her pharmacist. Counseled the patient's daughter to increase the dose to 15 mg from 10 mg. If there is no improvement at this dose, will plan to increase to 30 mg.  The patient had a significant amount of dried cerumen impacting ears bilaterally. Attempts to clean ears were unsuccessful. Counseled the patient and her daughter to utilize Debrox to soften earwax; will plan to follow  up to attempt disimpaction again in the future.  Will check labs today, including A1c, CMP, CBC, and TSH. Dispensed flu vaccine to the patient and her husband.  Orders Placed This Encounter  Procedures   Flu Vaccine QUAD High Dose(Fluad)   Bayer DCA Hb A1c Waived   CMP14+EGFR   TSH   CBC   Meds ordered this encounter  Medications   atorvastatin (LIPITOR) 80 MG tablet    Sig: Take 1 tablet (80 mg total) by mouth daily.    Dispense:  90 tablet    Refill:  3   busPIRone (BUSPAR) 15 MG tablet    Sig: Take 1 tablet (15 mg total) by mouth 2 (two) times daily. For anxiety **please note change in dose!    Dispense:  180 tablet    Refill:  1   gabapentin (NEURONTIN) 300 MG capsule    Sig: Take 1 capsule (300 mg total) by mouth 3 (three) times daily.    Dispense:  270 capsule    Refill:  3   memantine (NAMENDA) 10 MG tablet    Sig: Take 1 tablet (10 mg total) by mouth 2 (two) times daily.    Dispense:  180 tablet    Refill:  3   metoprolol succinate (TOPROL-XL) 100 MG 24 hr tablet    Sig: Take 1 tablet (100 mg total) by mouth every evening.    Dispense:  90 tablet    Refill:  Nellie, MS3

## 2022-04-20 NOTE — Patient Instructions (Signed)
DEBROX in each ear x1-2 week to help soften wax

## 2022-04-21 LAB — CBC
Hematocrit: 42.3 % (ref 34.0–46.6)
Hemoglobin: 14.7 g/dL (ref 11.1–15.9)
MCH: 35.2 pg — ABNORMAL HIGH (ref 26.6–33.0)
MCHC: 34.8 g/dL (ref 31.5–35.7)
MCV: 101 fL — ABNORMAL HIGH (ref 79–97)
Platelets: 407 10*3/uL (ref 150–450)
RBC: 4.18 x10E6/uL (ref 3.77–5.28)
RDW: 12.6 % (ref 11.7–15.4)
WBC: 9.8 10*3/uL (ref 3.4–10.8)

## 2022-04-21 LAB — CMP14+EGFR
ALT: 30 IU/L (ref 0–32)
AST: 32 IU/L (ref 0–40)
Albumin/Globulin Ratio: 1 — ABNORMAL LOW (ref 1.2–2.2)
Albumin: 3.7 g/dL (ref 3.7–4.7)
Alkaline Phosphatase: 65 IU/L (ref 44–121)
BUN/Creatinine Ratio: 13 (ref 12–28)
BUN: 11 mg/dL (ref 8–27)
Bilirubin Total: 0.4 mg/dL (ref 0.0–1.2)
CO2: 24 mmol/L (ref 20–29)
Calcium: 9.8 mg/dL (ref 8.7–10.3)
Chloride: 102 mmol/L (ref 96–106)
Creatinine, Ser: 0.86 mg/dL (ref 0.57–1.00)
Globulin, Total: 3.8 g/dL (ref 1.5–4.5)
Glucose: 96 mg/dL (ref 70–99)
Potassium: 4.2 mmol/L (ref 3.5–5.2)
Sodium: 140 mmol/L (ref 134–144)
Total Protein: 7.5 g/dL (ref 6.0–8.5)
eGFR: 67 mL/min/{1.73_m2} (ref >59)

## 2022-04-21 LAB — TSH: TSH: 4.26 u[IU]/mL (ref 0.450–4.500)

## 2022-04-27 ENCOUNTER — Telehealth: Payer: Self-pay | Admitting: Family Medicine

## 2022-04-27 DIAGNOSIS — H9193 Unspecified hearing loss, bilateral: Secondary | ICD-10-CM

## 2022-04-27 NOTE — Telephone Encounter (Signed)
REFERRAL REQUEST Telephone Note  Have you been seen at our office for this problem? Hearing test (Advise that they may need an appointment with their PCP before a referral can be done)  Reason for Referral: hearing test Referral discussed with patient: daughter says yes at last apt Best contact number of patient for referral team: [phone]    Has patient been seen by a specialist for this issue before: no  Patient provider preference for referral: [facility] Patient location preference for referral: [facility]   Patient notified that referrals can take up to a week or longer to process. If they haven't heard anything within a week they should call back and speak with the referral department.

## 2022-04-27 NOTE — Telephone Encounter (Signed)
Pt aware by vm. 

## 2022-04-27 NOTE — Telephone Encounter (Signed)
Referral placed.

## 2022-05-03 ENCOUNTER — Ambulatory Visit: Payer: Medicare Other | Attending: Audiologist | Admitting: Audiologist

## 2022-05-03 DIAGNOSIS — H6123 Impacted cerumen, bilateral: Secondary | ICD-10-CM | POA: Insufficient documentation

## 2022-05-03 NOTE — Procedures (Signed)
  Outpatient Audiology and [place], [place]  [id number] [phone]  AUDIOLOGICAL  EVALUATION  NAME: Eileen Jennings     DOB:   06/03/1940      MRN: [id number]                                                                                     DATE: 05/03/2022     REFERENT: Janora Norlander, DO STATUS: Outpatient DIAGNOSIS: Impacted Cerumen Bilateral    History: Eileen Jennings was seen for an audiological evaluation. Eileen Jennings was accompanied to the appointment by her husband. They were seen back to back for hearing tests. Eileen Jennings has always heard better than her husband. Eileen Jennings woke up recently and could not hear from the right ear. This was after using Debrox drops for excessive wax.  No other relevant case history reported.   Evaluation:  Otoscopy occluded cerumen, bilaterally Tympanometry results were consistent with flat responses with small volume bilaterally, wax is occluding  Audiometric testing was not completed due to impacted wax.   Results:  The test results were reviewed with Eileen Jennings. Sound is not bypassing the wax she has in both ears. Eileen Jennings needs the wax removed before her hearing can be accurately tested. She can reschedule here after having it removed, or have the test done where ever they go for her husband's hearing aids. Eileen Jennings agreed to plan and reported understanding.   Recommendations: Amplification is likely necessary for both ears, however wax must be removed for accurate assessment. Continue use of Debrox, see PCP for wax removal, then have audiologic evaluation.   Debrox Earwax Removal Drops are a safe and inexpensive in-home solution for wax removal. Debrox Earwax Removal Kit includes a soft rubber bulb syringe to rinse your ear after using Debrox Earwax Removal Drops. Excessive earwax build-up can lead to ear discomfort and reduced hearing, which can affect your day-to-day life. The kit can be purchased over the counter at [place], [place],  [facility], and most other pharmacies.  How to use the Debrox Earwax Removal Drops Kit:  tilt head sideways. place 5 to 10 drops into ear. tip of applicator should not enter ear canal. keep drops in ear for several minutes by keeping head tilted or placing cotton in the ear. use twice daily for up to four days  gently flush ear with water, using soft rubber bulb syringe after final treatment (on 4th day)       18 minutes spent testing and counseling on results.   Alfonse Alpers  Audiologist, Au.D., CCC-A 05/03/2022  3:14 PM  Cc: Janora Norlander, DO

## 2022-05-14 ENCOUNTER — Encounter: Payer: Self-pay | Admitting: Family Medicine

## 2022-05-14 ENCOUNTER — Ambulatory Visit (INDEPENDENT_AMBULATORY_CARE_PROVIDER_SITE_OTHER): Payer: Medicare Other | Admitting: Family Medicine

## 2022-05-14 VITALS — BP 122/70 | HR 64 | Temp 97.7°F | Ht 62.0 in | Wt 151.0 lb

## 2022-05-14 DIAGNOSIS — H6123 Impacted cerumen, bilateral: Secondary | ICD-10-CM | POA: Diagnosis not present

## 2022-05-14 NOTE — Progress Notes (Signed)
Subjective: CC: Impacted cerumen, difficulty hearing PCP: Janora Norlander, DO UXN:ATFT Eileen Jennings is a 81 y.o. female presenting to clinic today for:  1.  Impacted cerumen Patient is brought to the office by her daughter.  She is accompanied by her spouse.  She is here for ear irrigation as during her last visit it was noted that she had bilateral cerumen impaction.  They have been utilizing Debrox.   ROS: Per HPI  Allergies  Allergen Reactions   Hydrocodone-Acetaminophen Nausea And Vomiting   Past Medical History:  Diagnosis Date   Anemia    comes and goes   Anxiety    takes Ativan   Arthritis    Hx osteoarthritis   Blood transfusion age 5   tonsillectomy as child   Depression    takes Cymbalta   Drug overdose 08/01/2013   Fibromyalgia    pt. had polymyalgia not fibromyalgia   Headache(784.0)    takes Goody powders   Hypertension    takes Metoprolol   PONV (postoperative nausea and vomiting)    UTI (lower urinary tract infection) 08/01/2013    Current Outpatient Medications:    atorvastatin (LIPITOR) 80 MG tablet, Take 1 tablet (80 mg total) by mouth daily., Disp: 90 tablet, Rfl: 3   B Complex Vitamins (VITAMIN B COMPLEX PO), Take by mouth., Disp: , Rfl:    busPIRone (BUSPAR) 15 MG tablet, Take 1 tablet (15 mg total) by mouth 2 (two) times daily. For anxiety **please note change in dose!, Disp: 180 tablet, Rfl: 1   cholecalciferol (VITAMIN D3) 25 MCG (1000 UNIT) tablet, Take 1,000 Units by mouth daily., Disp: , Rfl:    ferrous sulfate 325 (65 FE) MG tablet, Take 325 mg by mouth daily with breakfast., Disp: , Rfl:    gabapentin (NEURONTIN) 300 MG capsule, Take 1 capsule (300 mg total) by mouth 3 (three) times daily., Disp: 270 capsule, Rfl: 3   Melatonin 3 MG CAPS, Take 1 capsule (3 mg total) by mouth at bedtime as needed (insomnia)., Disp: 30 capsule, Rfl: 0   memantine (NAMENDA) 10 MG tablet, Take 1 tablet (10 mg total) by mouth 2 (two) times daily., Disp: 180  tablet, Rfl: 3   metoprolol succinate (TOPROL-XL) 100 MG 24 hr tablet, Take 1 tablet (100 mg total) by mouth every evening., Disp: 90 tablet, Rfl: 3   predniSONE (DELTASONE) 5 MG tablet, Take 5 mg by mouth every morning. , Disp: , Rfl:  Social History   Socioeconomic History   Marital status: Married    Spouse name: Not on file   Number of children: Not on file   Years of education: Not on file   Highest education level: Not on file  Occupational History   Not on file  Tobacco Use   Smoking status: Former    Packs/day: 0.50    Years: 8.00    Total pack years: 4.00    Types: Cigarettes    Quit date: 03/29/1971    Years since quitting: 51.1   Smokeless tobacco: Never  Vaping Use   Vaping Use: Never used  Substance and Sexual Activity   Alcohol use: No   Drug use: No   Sexual activity: Never  Other Topics Concern   Not on file  Social History Narrative   Not on file   Social Determinants of Health   Financial Resource Strain: Low Risk  (03/22/2022)   Overall Financial Resource Strain (CARDIA)    Difficulty of Paying Living Expenses: Not  hard at all  Food Insecurity: No Food Insecurity (03/22/2022)   Hunger Vital Sign    Worried About Running Out of Food in the Last Year: Never true    Ran Out of Food in the Last Year: Never true  Transportation Needs: No Transportation Needs (03/22/2022)   PRAPARE - Hydrologist (Medical): No    Lack of Transportation (Non-Medical): No  Physical Activity: Inactive (03/22/2022)   Exercise Vital Sign    Days of Exercise per Week: 0 days    Minutes of Exercise per Session: 0 min  Stress: No Stress Concern Present (03/22/2022)   [place]    Feeling of Stress : Not at all  Social Connections: Moderately Integrated (03/22/2022)   Social Connection and Isolation Panel [NHANES]    Frequency of Communication with Friends and Family: More than  three times a week    Frequency of Social Gatherings with Friends and Family: More than three times a week    Attends Religious Services: More than 4 times per year    Active Member of [employer] or Organizations: No    Attends Archivist Meetings: Never    Marital Status: Married  Human resources officer Violence: Not At Risk (03/22/2022)   Humiliation, Afraid, Rape, and Kick questionnaire    Fear of Current or Ex-Partner: No    Emotionally Abused: No    Physically Abused: No    Sexually Abused: No   Family History  Problem Relation Age of Onset   CAD Father    Depression Father    Stroke Other    Drug abuse Mother        prescription drug abuse   Breast cancer Neg Hx     Objective: Office vital signs reviewed. BP 122/70   Pulse 64   Temp 97.7 F (36.5 C)   Ht '5\' 2"'$  (1.575 m)   Wt 151 lb (68.5 kg)   SpO2 97%   BMI 27.62 kg/m   Physical Examination:  General: Awake, alert, well nourished, No acute distress HEENT: Bilateral TMs well-visualized.  No ongoing cerumen.  No significant erythema or complications related to irrigation  Assessment/ Plan: 81 y.o. female   Bilateral impacted cerumen Irrigated successfully with no complications.  Follow-up as needed  No orders of the defined types were placed in this encounter.  No orders of the defined types were placed in this encounter.    Janora Norlander, DO [place] [phone]

## 2022-10-25 ENCOUNTER — Ambulatory Visit (INDEPENDENT_AMBULATORY_CARE_PROVIDER_SITE_OTHER): Payer: Medicare Other | Admitting: Family Medicine

## 2022-10-25 ENCOUNTER — Encounter: Payer: Self-pay | Admitting: Family Medicine

## 2022-10-25 VITALS — BP 115/72 | HR 80 | Temp 98.6°F | Ht 62.0 in | Wt 145.0 lb

## 2022-10-25 DIAGNOSIS — Z131 Encounter for screening for diabetes mellitus: Secondary | ICD-10-CM

## 2022-10-25 DIAGNOSIS — Z8673 Personal history of transient ischemic attack (TIA), and cerebral infarction without residual deficits: Secondary | ICD-10-CM

## 2022-10-25 DIAGNOSIS — G301 Alzheimer's disease with late onset: Secondary | ICD-10-CM | POA: Diagnosis not present

## 2022-10-25 DIAGNOSIS — F411 Generalized anxiety disorder: Secondary | ICD-10-CM

## 2022-10-25 DIAGNOSIS — M353 Polymyalgia rheumatica: Secondary | ICD-10-CM | POA: Diagnosis not present

## 2022-10-25 DIAGNOSIS — L810 Postinflammatory hyperpigmentation: Secondary | ICD-10-CM | POA: Diagnosis not present

## 2022-10-25 DIAGNOSIS — Z23 Encounter for immunization: Secondary | ICD-10-CM

## 2022-10-25 DIAGNOSIS — F02A0 Dementia in other diseases classified elsewhere, mild, without behavioral disturbance, psychotic disturbance, mood disturbance, and anxiety: Secondary | ICD-10-CM

## 2022-10-25 LAB — BAYER DCA HB A1C WAIVED: HB A1C (BAYER DCA - WAIVED): 5.5 % (ref 4.8–5.6)

## 2022-10-25 MED ORDER — ATORVASTATIN CALCIUM 80 MG PO TABS: 80.0000 mg | ORAL_TABLET | Freq: Every day | ORAL | 3 refills | Status: DC

## 2022-10-25 MED ORDER — METOPROLOL SUCCINATE ER 100 MG PO TB24: 100.0000 mg | ORAL_TABLET | Freq: Every evening | ORAL | 3 refills | Status: DC

## 2022-10-25 MED ORDER — GABAPENTIN 300 MG PO CAPS: 300.0000 mg | ORAL_CAPSULE | Freq: Three times a day (TID) | ORAL | 3 refills | Status: DC

## 2022-10-25 MED ORDER — MEMANTINE HCL 10 MG PO TABS: 10.0000 mg | ORAL_TABLET | Freq: Two times a day (BID) | ORAL | 3 refills | Status: DC

## 2022-10-25 MED ORDER — BUSPIRONE HCL 15 MG PO TABS: 15.0000 mg | ORAL_TABLET | Freq: Three times a day (TID) | ORAL | 3 refills | Status: DC

## 2022-10-25 NOTE — Progress Notes (Signed)
Subjective: CC: Anxiety PCP: Raliegh Ip, DO WUJ:WJXB R [place] is a 82 y.o. female presenting to clinic today for:  1.  Anxiety disorder Patient is brought to the office by her daughter.  She continues to have intermittent anxiety, particularly when it comes to consistency in her medication.  She had a mother who apparently had a substance use disorder and she worries about not taking her medication exactly as prescribed and developing the same.  She takes the buspirone 50 mg twice daily but again still has some breakthrough issues.  Memory loss is unchanged.  She is compliant with Namenda.  She continues to crave sugar and eat a lot of sugary type foods  Her daughter notes that she has what appears to be discoloration on the thighs and she thinks this is due to use of a heating pad pretty much every day.  She continues to see rheumatology for polymyalgia   ROS: Per HPI  Allergies  Allergen Reactions   Hydrocodone-Acetaminophen Nausea And Vomiting   Past Medical History:  Diagnosis Date   Anemia    comes and goes   Anxiety    takes Ativan   Arthritis    Hx osteoarthritis   Blood transfusion age 66   tonsillectomy as child   Depression    takes Cymbalta   Drug overdose 08/01/2013   Fibromyalgia    pt. had polymyalgia not fibromyalgia   Headache(784.0)    takes Goody powders   Hypertension    takes Metoprolol   PONV (postoperative nausea and vomiting)    UTI (lower urinary tract infection) 08/01/2013    Current Outpatient Medications:    atorvastatin (LIPITOR) 80 MG tablet, Take 1 tablet (80 mg total) by mouth daily., Disp: 90 tablet, Rfl: 3   B Complex Vitamins (VITAMIN B COMPLEX PO), Take by mouth., Disp: , Rfl:    busPIRone (BUSPAR) 15 MG tablet, Take 1 tablet (15 mg total) by mouth 2 (two) times daily. For anxiety **please note change in dose!, Disp: 180 tablet, Rfl: 1   cholecalciferol (VITAMIN D3) 25 MCG (1000 UNIT) tablet, Take 1,000 Units by mouth daily.,  Disp: , Rfl:    ferrous sulfate 325 (65 FE) MG tablet, Take 325 mg by mouth daily with breakfast., Disp: , Rfl:    gabapentin (NEURONTIN) 300 MG capsule, Take 1 capsule (300 mg total) by mouth 3 (three) times daily., Disp: 270 capsule, Rfl: 3   Melatonin 3 MG CAPS, Take 1 capsule (3 mg total) by mouth at bedtime as needed (insomnia)., Disp: 30 capsule, Rfl: 0   memantine (NAMENDA) 10 MG tablet, Take 1 tablet (10 mg total) by mouth 2 (two) times daily., Disp: 180 tablet, Rfl: 3   metoprolol succinate (TOPROL-XL) 100 MG 24 hr tablet, Take 1 tablet (100 mg total) by mouth every evening., Disp: 90 tablet, Rfl: 3   predniSONE (DELTASONE) 5 MG tablet, Take 5 mg by mouth every morning. , Disp: , Rfl:  Social History   Socioeconomic History   Marital status: Married    Spouse name: Not on file   Number of children: Not on file   Years of education: Not on file   Highest education level: Not on file  Occupational History   Not on file  Tobacco Use   Smoking status: Former    Packs/day: 0.50    Years: 8.00    Additional pack years: 0.00    Total pack years: 4.00    Types: Cigarettes  Quit date: 03/29/1971    Years since quitting: 51.6   Smokeless tobacco: Never  Vaping Use   Vaping Use: Never used  Substance and Sexual Activity   Alcohol use: No   Drug use: No   Sexual activity: Never  Other Topics Concern   Not on file  Social History Narrative   Not on file   Social Determinants of Health   Financial Resource Strain: Low Risk  (03/22/2022)   Overall Financial Resource Strain (CARDIA)    Difficulty of Paying Living Expenses: Not hard at all  Food Insecurity: No Food Insecurity (03/22/2022)   Hunger Vital Sign    Worried About Running Out of Food in the Last Year: Never true    Ran Out of Food in the Last Year: Never true  Transportation Needs: No Transportation Needs (03/22/2022)   PRAPARE - Administrator, Civil Service (Medical): No    Lack of Transportation  (Non-Medical): No  Physical Activity: Inactive (03/22/2022)   Exercise Vital Sign    Days of Exercise per Week: 0 days    Minutes of Exercise per Session: 0 min  Stress: No Stress Concern Present (03/22/2022)   [employer] of Occupational Health - Occupational Stress Questionnaire    Feeling of Stress : Not at all  Social Connections: Moderately Integrated (03/22/2022)   Social Connection and Isolation Panel [NHANES]    Frequency of Communication with Friends and Family: More than three times a week    Frequency of Social Gatherings with Friends and Family: More than three times a week    Attends Religious Services: More than 4 times per year    Active Member of [employer] or Organizations: No    Attends Banker Meetings: Never    Marital Status: Married  Catering manager Violence: Not At Risk (03/22/2022)   Humiliation, Afraid, Rape, and Kick questionnaire    Fear of Current or Ex-Partner: No    Emotionally Abused: No    Physically Abused: No    Sexually Abused: No   Family History  Problem Relation Age of Onset   CAD Father    Depression Father    Stroke Other    Drug abuse Mother        prescription drug abuse   Breast cancer Neg Hx     Objective: Office vital signs reviewed. BP 115/72   Pulse 80   Temp 98.6 F (37 C)   Ht 5\' 2"  (1.575 m)   Wt 145 lb (65.8 kg)   SpO2 92%   BMI 26.52 kg/m   Physical Examination:  General: Awake, alert, well nourished, No acute distress HEENT: sclera white, MMM Cardio: regular rate and rhythm, S1S2 heard, no murmurs appreciated Pulm: clear to auscultation bilaterally, no wheezes, rhonchi or rales; normal work of breathing on room air MSK: Uses rolling walker for ambulation. Skin: Brown hyperpigmentation noted along the medial anterior thighs bilaterally     10/25/2022    3:27 PM 05/14/2022   11:48 AM 04/20/2022   11:47 AM  Depression screen PHQ 2/9  Decreased Interest 0 0 0  Down, Depressed, Hopeless 0 0 0   PHQ - 2 Score 0 0 0  Altered sleeping 2    Tired, decreased energy 2    Change in appetite 0    Feeling bad or failure about yourself  0    Trouble concentrating 1    Moving slowly or fidgety/restless 0    Suicidal thoughts 0  PHQ-9 Score 5    Difficult doing work/chores Somewhat difficult        10/25/2022    3:27 PM 05/14/2022   11:48 AM 04/20/2022   11:47 AM 12/08/2021    1:51 PM  GAD 7 : Generalized Anxiety Score  Nervous, Anxious, on Edge 0 0 0 2  Control/stop worrying 0 0 0 2  Worry too much - different things 0 0 0 2  Trouble relaxing 0 0 0 0  Restless 0 0 0 0  Easily annoyed or irritable 0 0 0 0  Afraid - awful might happen 0 0 0 1  Total GAD 7 Score 0 0 0 7  Anxiety Difficulty Not difficult at all Not difficult at all Not difficult at all Somewhat difficult    Assessment/ Plan: 82 y.o. female   Generalized anxiety disorder - Plan: busPIRone (BUSPAR) 15 MG tablet  Polymyalgia rheumatica (HCC) - Plan: gabapentin (NEURONTIN) 300 MG capsule, CMP14+EGFR, CBC  Screening for diabetes mellitus - Plan: Bayer DCA Hb A1c Waived  Postinflammatory hyperpigmentation  Mild late onset Alzheimer's dementia without behavioral disturbance, psychotic disturbance, mood disturbance, or anxiety (HCC) - Plan: memantine (NAMENDA) 10 MG tablet  History of CVA in adulthood - Plan: atorvastatin (LIPITOR) 80 MG tablet, metoprolol succinate (TOPROL-XL) 100 MG 24 hr tablet  Advance buspirone to 15 mg 3 times daily  Check CMP, CBC.  Will CC to Dr. Dierdre Forth  Screening A1c shows improvement in prediabetic levels that were noted on last visit.  Suspect some hyperpigmentation secondary to chronic use of heating pad.  Advised against ongoing, regular use if not needed   No orders of the defined types were placed in this encounter.  No orders of the defined types were placed in this encounter.    Raliegh Ip, DO [facility] [phone]

## 2022-10-26 LAB — CBC
Hematocrit: 40.4 % (ref 34.0–46.6)
Hemoglobin: 13.6 g/dL (ref 11.1–15.9)
MCH: 33.7 pg — ABNORMAL HIGH (ref 26.6–33.0)
MCHC: 33.7 g/dL (ref 31.5–35.7)
MCV: 100 fL — ABNORMAL HIGH (ref 79–97)
Platelets: 341 10*3/uL (ref 150–450)
RBC: 4.04 x10E6/uL (ref 3.77–5.28)
RDW: 12.6 % (ref 11.7–15.4)
WBC: 10.8 10*3/uL (ref 3.4–10.8)

## 2022-10-26 LAB — CMP14+EGFR
ALT: 23 IU/L (ref 0–32)
AST: 33 IU/L (ref 0–40)
Albumin/Globulin Ratio: 1.1 — ABNORMAL LOW (ref 1.2–2.2)
Albumin: 3.9 g/dL (ref 3.7–4.7)
Alkaline Phosphatase: 61 IU/L (ref 44–121)
BUN/Creatinine Ratio: 12 (ref 12–28)
BUN: 11 mg/dL (ref 8–27)
Bilirubin Total: 0.5 mg/dL (ref 0.0–1.2)
CO2: 22 mmol/L (ref 20–29)
Calcium: 9.3 mg/dL (ref 8.7–10.3)
Chloride: 107 mmol/L — ABNORMAL HIGH (ref 96–106)
Creatinine, Ser: 0.92 mg/dL (ref 0.57–1.00)
Globulin, Total: 3.4 g/dL (ref 1.5–4.5)
Glucose: 94 mg/dL (ref 70–99)
Potassium: 4.9 mmol/L (ref 3.5–5.2)
Sodium: 145 mmol/L — ABNORMAL HIGH (ref 134–144)
Total Protein: 7.3 g/dL (ref 6.0–8.5)
eGFR: 62 mL/min/{1.73_m2} (ref >59)

## 2023-03-14 ENCOUNTER — Ambulatory Visit: Payer: Medicare Other | Admitting: Family Medicine

## 2023-03-14 ENCOUNTER — Encounter: Payer: Self-pay | Admitting: Family Medicine

## 2023-03-14 VITALS — BP 110/72 | HR 90 | Temp 97.9°F | Ht 62.0 in | Wt 140.2 lb

## 2023-03-14 DIAGNOSIS — Z23 Encounter for immunization: Secondary | ICD-10-CM

## 2023-03-14 DIAGNOSIS — R399 Unspecified symptoms and signs involving the genitourinary system: Secondary | ICD-10-CM

## 2023-03-14 MED ORDER — CEPHALEXIN 500 MG PO CAPS: 500.0000 mg | ORAL_CAPSULE | Freq: Two times a day (BID) | ORAL | 0 refills | Status: DC

## 2023-03-14 NOTE — Progress Notes (Signed)
   Acute Office Visit  Subjective:     Patient ID: Eileen Jennings, female    DOB: 03-Mar-1941, 82 y.o.   MRN: [id number]  Chief Complaint  Patient presents with   foul smelling urine    HPI Patient is in today for UTI symptoms for 1 week. Reports foul smelling urine with dark color. Also has urgency and hesitancy. Denies fever, chills, nausea, vomiting, flank pain, hematuria. Remote history of UTI.   ROS As per HPI.      Objective:    BP 110/72   Pulse 90   Temp 97.9 F (36.6 C) (Temporal)   Ht 5\' 2"  (1.575 m)   Wt 140 lb 4 oz (63.6 kg)   SpO2 94%   BMI 25.65 kg/m    Physical Exam Vitals and nursing note reviewed.  Constitutional:      General: She is not in acute distress.    Appearance: She is not ill-appearing, toxic-appearing or diaphoretic.  Cardiovascular:     Rate and Rhythm: Normal rate and regular rhythm.     Heart sounds: No murmur heard. Pulmonary:     Effort: Pulmonary effort is normal.  Abdominal:     General: Bowel sounds are normal. There is no distension.     Palpations: Abdomen is soft.     Tenderness: There is no abdominal tenderness. There is no right CVA tenderness, left CVA tenderness, guarding or rebound.  Neurological:     Mental Status: She is alert and oriented to person, place, and time. Mental status is at baseline.     Gait: Gait abnormal (using walker).  Psychiatric:        Mood and Affect: Mood normal.        Behavior: Behavior normal.     No results found for any visits on 03/14/23.      Assessment & Plan:    Eileen Jennings was seen today for foul smelling urine.  Diagnoses and all orders for this visit:  UTI symptoms Patient was unable to leave specimen today while at the office. She took specimen cup home to return with by the end of today. Sent keflex to start after dropping off urine to treat empirically for UTI pending culture.  -     Urinalysis, Routine w reflex microscopic -     cephALEXin (KEFLEX) 500 MG capsule; Take 1  capsule (500 mg total) by mouth 2 (two) times daily.  Encounter for immunization -     Flu Vaccine Trivalent High Dose (Fluad)  Return to office for new or worsening symptoms, or if symptoms persist.   The patient indicates understanding of these issues and agrees with the plan.  Gabriel Earing, FNP

## 2023-03-15 ENCOUNTER — Other Ambulatory Visit: Payer: Medicare Other

## 2023-03-15 LAB — URINALYSIS, ROUTINE W REFLEX MICROSCOPIC
Bilirubin, UA: NEGATIVE
Glucose, UA: NEGATIVE
Nitrite, UA: NEGATIVE
RBC, UA: NEGATIVE
Specific Gravity, UA: 1.025 (ref 1.005–1.030)
Urobilinogen, Ur: 0.2 mg/dL (ref 0.2–1.0)
pH, UA: 7 (ref 5.0–7.5)

## 2023-03-15 LAB — MICROSCOPIC EXAMINATION
Renal Epithel, UA: NONE SEEN /[HPF]
Yeast, UA: NONE SEEN

## 2023-03-21 ENCOUNTER — Telehealth (INDEPENDENT_AMBULATORY_CARE_PROVIDER_SITE_OTHER): Payer: Medicare Other | Admitting: Family Medicine

## 2023-03-21 ENCOUNTER — Encounter: Payer: Self-pay | Admitting: Family Medicine

## 2023-03-21 DIAGNOSIS — G301 Alzheimer's disease with late onset: Secondary | ICD-10-CM

## 2023-03-21 DIAGNOSIS — M353 Polymyalgia rheumatica: Secondary | ICD-10-CM

## 2023-03-21 DIAGNOSIS — F02A0 Dementia in other diseases classified elsewhere, mild, without behavioral disturbance, psychotic disturbance, mood disturbance, and anxiety: Secondary | ICD-10-CM

## 2023-03-21 NOTE — Progress Notes (Signed)
MyChart Video visit  Subjective: CC:F2F PCP: Raliegh Ip, DO WUJ:WJXB R [place] is a 82 y.o. female. Patient provides verbal consent for consult held via video.  Due to COVID-19 pandemic this visit was conducted virtually. This visit type was conducted due to national recommendations for restrictions regarding the COVID-19 Pandemic (e.g. social distancing, sheltering in place) in an effort to limit this patient's exposure and mitigate transmission in our community. All issues noted in this document were discussed and addressed.  A physical exam was not performed with this format.   Location of patient: home Location of provider: [facility] Others present for call: daughter, Leia Alf  1. Needs home health Patient needs some help with toileting, bathing.  Has PMR and alzheimer's.  Her primary caregiver is usually her spouse but he has been ill.  Needs help temporarily. No falls recently.  ROS: Per HPI  Allergies  Allergen Reactions   Hydrocodone-Acetaminophen Nausea And Vomiting   Past Medical History:  Diagnosis Date   Anemia    comes and goes   Anxiety    takes Ativan   Arthritis    Hx osteoarthritis   Blood transfusion age 63   tonsillectomy as child   Depression    takes Cymbalta   Drug overdose 08/01/2013   Fibromyalgia    pt. had polymyalgia not fibromyalgia   Headache(784.0)    takes Goody powders   Hypertension    takes Metoprolol   PONV (postoperative nausea and vomiting)    UTI (lower urinary tract infection) 08/01/2013    Current Outpatient Medications:    atorvastatin (LIPITOR) 80 MG tablet, Take 1 tablet (80 mg total) by mouth daily., Disp: 90 tablet, Rfl: 3   B Complex Vitamins (VITAMIN B COMPLEX PO), Take by mouth., Disp: , Rfl:    busPIRone (BUSPAR) 15 MG tablet, Take 1 tablet (15 mg total) by mouth 3 (three) times daily. For anxiety **please note change in dose!, Disp: 270 tablet, Rfl: 3   cephALEXin (KEFLEX) 500 MG capsule, Take 1 capsule (500 mg total) by  mouth 2 (two) times daily., Disp: 14 capsule, Rfl: 0   cholecalciferol (VITAMIN D3) 25 MCG (1000 UNIT) tablet, Take 1,000 Units by mouth daily., Disp: , Rfl:    denosumab (PROLIA) 60 MG/ML SOSY injection, 60mg  Subcutaneous every 6 months, Disp: , Rfl:    ferrous sulfate 325 (65 FE) MG tablet, Take 325 mg by mouth daily with breakfast., Disp: , Rfl:    gabapentin (NEURONTIN) 300 MG capsule, Take 1 capsule (300 mg total) by mouth 3 (three) times daily., Disp: 270 capsule, Rfl: 3   Melatonin 3 MG CAPS, Take 1 capsule (3 mg total) by mouth at bedtime as needed (insomnia)., Disp: 30 capsule, Rfl: 0   memantine (NAMENDA) 10 MG tablet, Take 1 tablet (10 mg total) by mouth 2 (two) times daily., Disp: 180 tablet, Rfl: 3   metoprolol succinate (TOPROL-XL) 100 MG 24 hr tablet, Take 1 tablet (100 mg total) by mouth every evening., Disp: 90 tablet, Rfl: 3   predniSONE (DELTASONE) 5 MG tablet, Take 5 mg by mouth every morning. , Disp: , Rfl:   Gen: well appearing elderly female, NAD Psych: mood stable, speech normal. Pleasantly demented Neuro: difficult of hearing  Assessment/ Plan: 82 y.o. female   Mild late onset Alzheimer's dementia without behavioral disturbance, psychotic disturbance, mood disturbance, or anxiety (HCC) - Plan: Ambulatory referral to Home Health  Polymyalgia rheumatica (HCC) - Plan: Ambulatory referral to Home Health  [facility] ordered.  Anticipate  only a few weeks of need while her husband recovers from recent hospitalization.   Start time: 1:02pm End time: 1:08pm  Total time spent on patient care (including video visit/ documentation): 6 minutes  Seniya Stoffers Hulen Skains, DO [facility] [phone]

## 2023-03-24 ENCOUNTER — Telehealth: Payer: Self-pay | Admitting: Family Medicine

## 2023-03-25 NOTE — Telephone Encounter (Signed)
This order was placed a few days ago.

## 2023-03-29 NOTE — Telephone Encounter (Signed)
I reached out to [facility] and never received a call back as to where her Husband was receiving HH from - She has been accepted with [facility], but I will send Clydie Braun with [facility] to see if they are able to accept Patient.

## 2023-04-12 ENCOUNTER — Telehealth: Payer: Self-pay | Admitting: Family Medicine

## 2023-04-12 NOTE — Telephone Encounter (Signed)
Attempted to call no answer   Approved Per Dr Reece Agar

## 2023-04-12 NOTE — Telephone Encounter (Signed)
Copied from CRM [id number]. Topic: Clinical - Home Health Verbal Orders >> Apr 12, 2023  2:27 PM Dennison Nancy wrote: Caller/Agency:  [facility] Callback Number: [phone]  Service Requested: occupation therapy  Frequency: 1 time a week for 7 weeks Any new concerns about the patient? No

## 2023-05-09 ENCOUNTER — Ambulatory Visit (INDEPENDENT_AMBULATORY_CARE_PROVIDER_SITE_OTHER): Payer: Medicare Other

## 2023-05-09 DIAGNOSIS — M797 Fibromyalgia: Secondary | ICD-10-CM

## 2023-05-09 DIAGNOSIS — F32A Depression, unspecified: Secondary | ICD-10-CM

## 2023-05-09 DIAGNOSIS — I10 Essential (primary) hypertension: Secondary | ICD-10-CM

## 2023-05-09 DIAGNOSIS — G501 Atypical facial pain: Secondary | ICD-10-CM

## 2023-05-09 DIAGNOSIS — M353 Polymyalgia rheumatica: Secondary | ICD-10-CM

## 2023-05-09 DIAGNOSIS — F02A0 Dementia in other diseases classified elsewhere, mild, without behavioral disturbance, psychotic disturbance, mood disturbance, and anxiety: Secondary | ICD-10-CM | POA: Diagnosis not present

## 2023-05-09 DIAGNOSIS — M1990 Unspecified osteoarthritis, unspecified site: Secondary | ICD-10-CM

## 2023-05-09 DIAGNOSIS — F419 Anxiety disorder, unspecified: Secondary | ICD-10-CM

## 2023-05-09 DIAGNOSIS — Z7951 Long term (current) use of inhaled steroids: Secondary | ICD-10-CM

## 2023-05-09 DIAGNOSIS — D649 Anemia, unspecified: Secondary | ICD-10-CM

## 2023-05-09 DIAGNOSIS — N390 Urinary tract infection, site not specified: Secondary | ICD-10-CM

## 2023-05-27 ENCOUNTER — Ambulatory Visit (INDEPENDENT_AMBULATORY_CARE_PROVIDER_SITE_OTHER): Payer: Medicare Other | Admitting: Family Medicine

## 2023-05-27 ENCOUNTER — Encounter: Payer: Self-pay | Admitting: Family Medicine

## 2023-05-27 VITALS — BP 135/69 | HR 71 | Temp 98.0°F | Ht 62.0 in | Wt 148.0 lb

## 2023-05-27 DIAGNOSIS — F411 Generalized anxiety disorder: Secondary | ICD-10-CM | POA: Diagnosis not present

## 2023-05-27 DIAGNOSIS — I10 Essential (primary) hypertension: Secondary | ICD-10-CM

## 2023-05-27 DIAGNOSIS — Z0000 Encounter for general adult medical examination without abnormal findings: Secondary | ICD-10-CM

## 2023-05-27 DIAGNOSIS — F02A0 Dementia in other diseases classified elsewhere, mild, without behavioral disturbance, psychotic disturbance, mood disturbance, and anxiety: Secondary | ICD-10-CM

## 2023-05-27 DIAGNOSIS — M810 Age-related osteoporosis without current pathological fracture: Secondary | ICD-10-CM

## 2023-05-27 DIAGNOSIS — Z0001 Encounter for general adult medical examination with abnormal findings: Secondary | ICD-10-CM

## 2023-05-27 DIAGNOSIS — Z7952 Long term (current) use of systemic steroids: Secondary | ICD-10-CM

## 2023-05-27 DIAGNOSIS — G301 Alzheimer's disease with late onset: Secondary | ICD-10-CM

## 2023-05-27 DIAGNOSIS — M353 Polymyalgia rheumatica: Secondary | ICD-10-CM | POA: Diagnosis not present

## 2023-05-27 DIAGNOSIS — Z131 Encounter for screening for diabetes mellitus: Secondary | ICD-10-CM

## 2023-05-27 DIAGNOSIS — Z8673 Personal history of transient ischemic attack (TIA), and cerebral infarction without residual deficits: Secondary | ICD-10-CM

## 2023-05-27 LAB — COMPREHENSIVE METABOLIC PANEL
ALT: 24 [IU]/L (ref 0–32)
AST: 28 [IU]/L (ref 0–40)
Albumin: 4 g/dL (ref 3.7–4.7)
Alkaline Phosphatase: 68 [IU]/L (ref 44–121)
BUN/Creatinine Ratio: 14 (ref 12–28)
BUN: 12 mg/dL (ref 8–27)
Bilirubin Total: 0.4 mg/dL (ref 0.0–1.2)
CO2: 24 mmol/L (ref 20–29)
Calcium: 9.7 mg/dL (ref 8.7–10.3)
Chloride: 103 mmol/L (ref 96–106)
Creatinine, Ser: 0.86 mg/dL (ref 0.57–1.00)
Globulin, Total: 3.2 g/dL (ref 1.5–4.5)
Glucose: 80 mg/dL (ref 70–99)
Potassium: 4.8 mmol/L (ref 3.5–5.2)
Sodium: 141 mmol/L (ref 134–144)
Total Protein: 7.2 g/dL (ref 6.0–8.5)
eGFR: 67 mL/min/{1.73_m2} (ref >59)

## 2023-05-27 LAB — CBC WITH DIFFERENTIAL/PLATELET
Basophils Absolute: 0.1 10*3/uL (ref 0.0–0.2)
Basos: 1 %
EOS (ABSOLUTE): 0.4 10*3/uL (ref 0.0–0.4)
Eos: 3 %
Hematocrit: 44.8 % (ref 34.0–46.6)
Hemoglobin: 15 g/dL (ref 11.1–15.9)
Immature Grans (Abs): 0 10*3/uL (ref 0.0–0.1)
Immature Granulocytes: 0 %
Lymphocytes Absolute: 1.7 10*3/uL (ref 0.7–3.1)
Lymphs: 16 %
MCH: 33.6 pg — ABNORMAL HIGH (ref 26.6–33.0)
MCHC: 33.5 g/dL (ref 31.5–35.7)
MCV: 100 fL — ABNORMAL HIGH (ref 79–97)
Monocytes Absolute: 1.3 10*3/uL — ABNORMAL HIGH (ref 0.1–0.9)
Monocytes: 12 %
Neutrophils Absolute: 7.4 10*3/uL — ABNORMAL HIGH (ref 1.4–7.0)
Neutrophils: 68 %
Platelets: 332 10*3/uL (ref 150–450)
RBC: 4.47 x10E6/uL (ref 3.77–5.28)
RDW: 12 % (ref 11.7–15.4)
WBC: 11 10*3/uL — ABNORMAL HIGH (ref 3.4–10.8)

## 2023-05-27 LAB — LIPID PANEL
Chol/HDL Ratio: 1.9 {ratio} (ref 0.0–4.4)
Cholesterol, Total: 140 mg/dL (ref 100–199)
HDL: 74 mg/dL (ref >39)
LDL Chol Calc (NIH): 48 mg/dL (ref 0–99)
Triglycerides: 99 mg/dL (ref 0–149)
VLDL Cholesterol Cal: 18 mg/dL (ref 5–40)

## 2023-05-27 LAB — TSH: TSH: 6.51 u[IU]/mL — ABNORMAL HIGH (ref 0.450–4.500)

## 2023-05-27 LAB — BAYER DCA HB A1C WAIVED: HB A1C (BAYER DCA - WAIVED): 5.8 % — ABNORMAL HIGH (ref 4.8–5.6)

## 2023-05-27 NOTE — Progress Notes (Signed)
 Eileen Jennings is a 83 y.o. female presents to office today for annual physical exam examination.    Concerns today include: 1.  None.  She has been stable.  She is brought to the office by her daughter today.  Had a recent urinary tract infection that is now treated.  Wants to hold off on vaccinations due to recent use of antibiotics.  Occupation: Retired, Marital status: Married to [employer], Substance use: None There are no preventive care reminders to display for this patient.  Refills needed today: None  Immunization History  Administered Date(s) Administered   Fluad Quad(high Dose 65+) 04/20/2022   Fluad Trivalent(High Dose 65+) 03/14/2023   Influenza Split 04/21/2011, 02/27/2012, 03/12/2014, 02/26/2015, 02/23/2016, 02/21/2017, 04/04/2019   Influenza, High Dose Seasonal PF 03/17/2018, 04/07/2021   Influenza,inj,quad, With Preservative 03/11/2017   Moderna Sars-Covid-2 Vaccination 07/19/2019, 08/15/2019   Pneumococcal Conjugate-13 11/20/2013   Pneumococcal Polysaccharide-23 09/30/2004   Td 06/26/2001   Tdap 07/22/2011, 10/25/2022   Zoster Recombinant(Shingrix ) 10/25/2022   Zoster, Live 01/19/2008, 04/07/2021, 10/05/2021   Past Medical History:  Diagnosis Date   Anemia    comes and goes   Anxiety    takes Ativan    Arthritis    Hx osteoarthritis   Blood transfusion age 58   tonsillectomy as child   Depression    takes Cymbalta    Drug overdose 08/01/2013   Fibromyalgia    pt. had polymyalgia not fibromyalgia   Headache(784.0)    takes Goody powders   Hypertension    takes Metoprolol    PONV (postoperative nausea and vomiting)    UTI (lower urinary tract infection) 08/01/2013   Social History   Socioeconomic History   Marital status: Married    Spouse name: Not on file   Number of children: Not on file   Years of education: Not on file   Highest education level: Not on file  Occupational History   Not on file  Tobacco Use   Smoking status: Former    Current  packs/day: 0.00    Average packs/day: 0.5 packs/day for 8.0 years (4.0 ttl pk-yrs)    Types: Cigarettes    Start date: 03/29/1963    Quit date: 03/29/1971    Years since quitting: 52.1   Smokeless tobacco: Never  Vaping Use   Vaping status: Never Used  Substance and Sexual Activity   Alcohol use: No   Drug use: No   Sexual activity: Never  Other Topics Concern   Not on file  Social History Narrative   Not on file   Social Drivers of Health   Financial Resource Strain: Low Risk  (03/22/2022)   Overall Financial Resource Strain (CARDIA)    Difficulty of Paying Living Expenses: Not hard at all  Food Insecurity: No Food Insecurity (03/22/2022)   Hunger Vital Sign    Worried About Radiation Protection Practitioner of Food in the Last Year: Never true    Ran Out of Food in the Last Year: Never true  Transportation Needs: No Transportation Needs (03/22/2022)   PRAPARE - Administrator, Civil Service (Medical): No    Lack of Transportation (Non-Medical): No  Physical Activity: Inactive (03/22/2022)   Exercise Vital Sign    Days of Exercise per Week: 0 days    Minutes of Exercise per Session: 0 min  Stress: No Stress Concern Present (03/22/2022)   [employer] of Occupational Health - Occupational Stress Questionnaire    Feeling of Stress : Not at all  Social Connections:  Moderately Integrated (03/22/2022)   Social Connection and Isolation Panel [NHANES]    Frequency of Communication with Friends and Family: More than three times a week    Frequency of Social Gatherings with Friends and Family: More than three times a week    Attends Religious Services: More than 4 times per year    Active Member of [employer] or Organizations: No    Attends Banker Meetings: Never    Marital Status: Married  Catering Manager Violence: Not At Risk (03/22/2022)   Humiliation, Afraid, Rape, and Kick questionnaire    Fear of Current or Ex-Partner: No    Emotionally Abused: No    Physically  Abused: No    Sexually Abused: No   Past Surgical History:  Procedure Laterality Date   ABDOMINAL HYSTERECTOMY  20 yrs. ago   COLONOSCOPY WITH PROPOFOL  N/A 01/30/2013   Procedure: COLONOSCOPY WITH PROPOFOL ;  Surgeon: Gladis MARLA Louder, MD;  Location: [facility];  Service: Endoscopy;  Laterality: N/A;   CYSTOCELE REPAIR  age 73 with rectocele   ESOPHAGOGASTRODUODENOSCOPY (EGD) WITH PROPOFOL  N/A 01/30/2013   Procedure: ESOPHAGOGASTRODUODENOSCOPY (EGD) WITH PROPOFOL ;  Surgeon: Gladis MARLA Louder, MD;  Location: [facility];  Service: Endoscopy;  Laterality: N/A;   INCONTINENCE SURGERY  4 yrs. ago   RECTOCELE REPAIR  age 67   TONSILLECTOMY     age 82   TOTAL KNEE ARTHROPLASTY  03/31/2011   Procedure: TOTAL KNEE ARTHROPLASTY;  Surgeon: Dempsey GAILS Aluisio;  Location: [facility];  Service: Orthopedics;  Laterality: Left;   Family History  Problem Relation Age of Onset   CAD Father    Depression Father    Stroke Other    Drug abuse Mother        prescription drug abuse   Breast cancer Neg Hx     Current Outpatient Medications:    atorvastatin  (LIPITOR) 80 MG tablet, Take 1 tablet (80 mg total) by mouth daily., Disp: 90 tablet, Rfl: 3   B Complex Vitamins (VITAMIN B COMPLEX PO), Take by mouth., Disp: , Rfl:    busPIRone  (BUSPAR ) 15 MG tablet, Take 1 tablet (15 mg total) by mouth 3 (three) times daily. For anxiety **please note change in dose!, Disp: 270 tablet, Rfl: 3   cholecalciferol (VITAMIN D3) 25 MCG (1000 UNIT) tablet, Take 1,000 Units by mouth daily., Disp: , Rfl:    denosumab  (PROLIA ) 60 MG/ML SOSY injection, 60mg  Subcutaneous every 6 months, Disp: , Rfl:    ferrous sulfate 325 (65 FE) MG tablet, Take 325 mg by mouth daily with breakfast., Disp: , Rfl:    gabapentin  (NEURONTIN ) 300 MG capsule, Take 1 capsule (300 mg total) by mouth 3 (three) times daily., Disp: 270 capsule, Rfl: 3   Melatonin 3 MG CAPS, Take 1 capsule (3 mg total) by mouth at bedtime as needed (insomnia)., Disp: 30 capsule,  Rfl: 0   memantine  (NAMENDA ) 10 MG tablet, Take 1 tablet (10 mg total) by mouth 2 (two) times daily., Disp: 180 tablet, Rfl: 3   metoprolol  succinate (TOPROL -XL) 100 MG 24 hr tablet, Take 1 tablet (100 mg total) by mouth every evening., Disp: 90 tablet, Rfl: 3   predniSONE  (DELTASONE ) 5 MG tablet, Take 5 mg by mouth every morning. , Disp: , Rfl:   Allergies  Allergen Reactions   Hydrocodone-Acetaminophen  Nausea And Vomiting     ROS: Review of Systems Pertinent items noted in HPI and remainder of comprehensive ROS otherwise negative.    Physical exam BP 135/69  Pulse 71   Temp 98 F (36.7 C)   Ht 5' 2 (1.575 m)   Wt 148 lb (67.1 kg)   SpO2 93%   BMI 27.07 kg/m  General appearance: alert, cooperative, appears stated age, and no distress Head: Normocephalic, without obvious abnormality, atraumatic Eyes: negative findings: lids and lashes normal, conjunctivae and sclerae normal, corneas clear, and pupils equal, round, reactive to light and accomodation Ears: TMs obstructed by cerumen.  They were irrigated successfully.  Upon recheck she had a slight hemostatic bleed noted along the 6 o'clock position of the left external auditory canal.  Ear exam otherwise unremarkable Nose: Nares normal. Septum midline. Mucosa normal. No drainage or sinus tenderness. Throat: lips, mucosa, and tongue normal; teeth and gums normal Neck: no adenopathy, supple, symmetrical, trachea midline, and thyroid  not enlarged, symmetric, no tenderness/mass/nodules Back: symmetric, no curvature. ROM normal. No CVA tenderness. Lungs: clear to auscultation bilaterally Heart: regular rate and rhythm, S1, S2 normal, no murmur, click, rub or gallop Abdomen: soft, non-tender; bowel sounds normal; no masses,  no organomegaly Extremities: extremities normal, atraumatic, no cyanosis or edema Pulses: 2+ and symmetric Skin:  Multiple senile purpura, solar lentigo throughout lower extremities Lymph nodes: Cervical,  supraclavicular, and axillary nodes normal. Neurologic: Pleasantly demented.  Does not appear to be responding to internal stimuli.     05/27/2023   11:40 AM 03/14/2023    1:29 PM 10/25/2022    3:27 PM  Depression screen PHQ 2/9  Decreased Interest 0 0 0  Down, Depressed, Hopeless 0 0 0  PHQ - 2 Score 0 0 0  Altered sleeping 0 0 2  Tired, decreased energy 0 0 2  Change in appetite 0 0 0  Feeling bad or failure about yourself  0 0 0  Trouble concentrating 0 0 1  Moving slowly or fidgety/restless 0 0 0  Suicidal thoughts 0 0 0  PHQ-9 Score 0 0 5  Difficult doing work/chores Not difficult at all Not difficult at all Somewhat difficult      05/27/2023   11:40 AM 10/25/2022    3:27 PM 05/14/2022   11:48 AM 04/20/2022   11:47 AM  GAD 7 : Generalized Anxiety Score  Nervous, Anxious, on Edge 0 0 0 0  Control/stop worrying 0 0 0 0  Worry too much - different things 0 0 0 0  Trouble relaxing 0 0 0 0  Restless 0 0 0 0  Easily annoyed or irritable 0 0 0 0  Afraid - awful might happen 0 0 0 0  Total GAD 7 Score 0 0 0 0  Anxiety Difficulty Not difficult at all Not difficult at all Not difficult at all Not difficult at all     Assessment/ Plan: Candis JONELLE Rung here for annual physical exam.   Annual physical exam  Polymyalgia rheumatica (HCC) - Plan: CBC with Differential/Platelet, Comprehensive metabolic panel  Current chronic use of systemic steroids - Plan: Bayer DCA Hb A1c Waived  Generalized anxiety disorder  Mild late onset Alzheimer's dementia without behavioral disturbance, psychotic disturbance, mood disturbance, or anxiety (HCC)  Primary hypertension - Plan: Comprehensive metabolic panel  History of CVA in adulthood - Plan: Comprehensive metabolic panel, TSH, Lipid panel  Age-related osteoporosis without current pathological fracture - Plan: VITAMIN D  25 Hydroxy (Vit-D Deficiency, Fractures)  She will return at a later date for vaccination.    A1c is prediabetic.  This  is likely secondary to chronic corticosteroid use.  Limit excess sugar where able.  Will reassess again in 6 months  Anxiety disorder and Alzheimer's dementia are chronic and stable.  No refills needed today.  Blood pressure is controlled.  No changes needed  Nonfasting lipid, liver function and electrolytes checked today.  Check Vit D. Continue prolia  for now.  Counseled on healthy lifestyle choices, including diet (rich in fruits, vegetables and lean meats and low in salt and simple carbohydrates) and exercise (at least 30 minutes of moderate physical activity daily).  Patient to follow up 6 months for sugar recheck  Jasper Hanf M. Jolinda, DO

## 2023-05-31 LAB — SPECIMEN STATUS REPORT

## 2023-05-31 LAB — VITAMIN D 25 HYDROXY (VIT D DEFICIENCY, FRACTURES): Vit D, 25-Hydroxy: 27.2 ng/mL — ABNORMAL LOW (ref 30.0–100.0)

## 2023-08-02 ENCOUNTER — Other Ambulatory Visit: Payer: Self-pay | Admitting: Family Medicine

## 2023-08-02 DIAGNOSIS — M353 Polymyalgia rheumatica: Secondary | ICD-10-CM

## 2023-08-02 DIAGNOSIS — F411 Generalized anxiety disorder: Secondary | ICD-10-CM

## 2023-08-04 ENCOUNTER — Ambulatory Visit (INDEPENDENT_AMBULATORY_CARE_PROVIDER_SITE_OTHER): Admitting: Family Medicine

## 2023-08-04 ENCOUNTER — Encounter: Payer: Self-pay | Admitting: Family Medicine

## 2023-08-04 ENCOUNTER — Telehealth: Payer: Self-pay

## 2023-08-04 VITALS — BP 114/78 | HR 78 | Ht 62.0 in | Wt 147.0 lb

## 2023-08-04 DIAGNOSIS — R410 Disorientation, unspecified: Secondary | ICD-10-CM

## 2023-08-04 DIAGNOSIS — G44209 Tension-type headache, unspecified, not intractable: Secondary | ICD-10-CM

## 2023-08-04 LAB — URINALYSIS, COMPLETE
Bilirubin, UA: NEGATIVE
Glucose, UA: NEGATIVE
Ketones, UA: NEGATIVE
Nitrite, UA: NEGATIVE
Specific Gravity, UA: 1.02 (ref 1.005–1.030)
Urobilinogen, Ur: 0.2 mg/dL (ref 0.2–1.0)
pH, UA: 7.5 (ref 5.0–7.5)

## 2023-08-04 LAB — MICROSCOPIC EXAMINATION: WBC, UA: >30 /HPF — AB (ref 0–5)

## 2023-08-04 MED ORDER — CEPHALEXIN 500 MG PO CAPS: 500.0000 mg | ORAL_CAPSULE | Freq: Four times a day (QID) | ORAL | 0 refills | Status: DC

## 2023-08-04 NOTE — Telephone Encounter (Signed)
 Copied from CRM [id number]. Topic: Clinical - Medical Advice >> Aug 04, 2023  1:18 PM Ivette P wrote: Reason for CRM: Leia Alf - Mom is having a UTI she is currently having symptoms, last time she had a UTI she was taken to ER, wants to see if antibiotic can be called in.    Bennie Dallas - [phone]

## 2023-08-04 NOTE — Progress Notes (Signed)
 BP 114/78   Pulse 78   Ht 5\' 2"  (1.575 m)   Wt 147 lb (66.7 kg)   SpO2 93%   BMI 26.89 kg/m    Subjective:   Patient ID: Eileen Jennings, female    DOB: February 09, 1941, 83 y.o.   MRN: [id number]  HPI: Eileen Jennings is a 83 y.o. female presenting on 08/04/2023 for Headache (Post scalp) and Urinary Tract Infection (More confused than normal)   HPI Patient is coming in today with a headache in the back of her right neck and sometimes on the left side.  She says it hurts more when she is turning side-to-side and has been there most days, it comes and goes.  She has had some confusion but that is also part of her baseline with her dementia and it is hard to tell if it is better or worse per her husband who is here with her.  She is not having any visual changes or cough or congestion.  They want to leave a urine because of her confusion and they do not know if it is any better or worse.  She denies any urinary symptoms  Relevant past medical, surgical, family and social history reviewed and updated as indicated. Interim medical history since our last visit reviewed. Allergies and medications reviewed and updated.  Review of Systems  Constitutional:  Negative for chills and fever.  HENT:  Negative for congestion, ear discharge and ear pain.   Eyes:  Negative for visual disturbance.  Respiratory:  Negative for chest tightness and shortness of breath.   Cardiovascular:  Negative for chest pain and leg swelling.  Gastrointestinal:  Negative for abdominal pain.  Genitourinary:  Negative for difficulty urinating, dysuria, flank pain and frequency.  Musculoskeletal:  Negative for back pain and gait problem.  Skin:  Negative for rash.  Neurological:  Positive for headaches. Negative for dizziness and light-headedness.  Psychiatric/Behavioral:  Negative for agitation and behavioral problems.   All other systems reviewed and are negative.   Per HPI unless specifically indicated above   Allergies as  of 08/04/2023       Reactions   Hydrocodone-acetaminophen Nausea And Vomiting        Medication List        Accurate as of August 04, 2023  4:16 PM. If you have any questions, ask your nurse or doctor.          atorvastatin 80 MG tablet Commonly known as: LIPITOR Take 1 tablet (80 mg total) by mouth daily.   busPIRone 15 MG tablet Commonly known as: BUSPAR TAKE ONE (1) TABLET BY MOUTH 3 TIMES DAILY   cephALEXin 500 MG capsule Commonly known as: KEFLEX Take 1 capsule (500 mg total) by mouth 4 (four) times daily. Started by: Elige Radon Lillyauna Jenkinson   cholecalciferol 25 MCG (1000 UNIT) tablet Commonly known as: VITAMIN D3 Take 1,000 Units by mouth daily.   ferrous sulfate 325 (65 FE) MG tablet Take 325 mg by mouth daily with breakfast.   gabapentin 300 MG capsule Commonly known as: NEURONTIN TAKE 1 CAPSULE BY MOUTH THREE TIMES A DAY   Melatonin 3 MG Caps Take 1 capsule (3 mg total) by mouth at bedtime as needed (insomnia).   memantine 10 MG tablet Commonly known as: Namenda Take 1 tablet (10 mg total) by mouth 2 (two) times daily.   metoprolol succinate 100 MG 24 hr tablet Commonly known as: TOPROL-XL Take 1 tablet (100 mg total) by mouth every evening.  predniSONE 5 MG tablet Commonly known as: DELTASONE Take 5 mg by mouth every morning.   Prolia 60 MG/ML Sosy injection Generic drug: denosumab 60mg  Subcutaneous every 6 months   VITAMIN B COMPLEX PO Take by mouth.         Objective:   BP 114/78   Pulse 78   Ht 5\' 2"  (1.575 m)   Wt 147 lb (66.7 kg)   SpO2 93%   BMI 26.89 kg/m   Wt Readings from Last 3 Encounters:  08/04/23 147 lb (66.7 kg)  05/27/23 148 lb (67.1 kg)  03/14/23 140 lb 4 oz (63.6 kg)    Physical Exam Vitals and nursing note reviewed.  Constitutional:      General: She is not in acute distress.    Appearance: She is well-developed. She is not diaphoretic.  HENT:     Right Ear: Tympanic membrane normal.     Left Ear: Tympanic  membrane normal.     Mouth/Throat:     Mouth: Mucous membranes are moist.     Pharynx: Oropharynx is clear. No oropharyngeal exudate or posterior oropharyngeal erythema.  Eyes:     Extraocular Movements: Extraocular movements intact.     Conjunctiva/sclera: Conjunctivae normal.     Pupils: Pupils are equal, round, and reactive to light.  Neck:   Cardiovascular:     Rate and Rhythm: Normal rate and regular rhythm.     Heart sounds: Normal heart sounds. No murmur heard. Pulmonary:     Effort: Pulmonary effort is normal. No respiratory distress.     Breath sounds: Normal breath sounds. No wheezing.  Abdominal:     General: Abdomen is flat. Bowel sounds are normal. There is no distension.     Palpations: Abdomen is soft.     Tenderness: There is no abdominal tenderness. There is no right CVA tenderness, left CVA tenderness, guarding or rebound.     Hernia: No hernia is present.  Musculoskeletal:        General: No tenderness. Normal range of motion.  Skin:    General: Skin is warm and dry.     Findings: No rash.  Neurological:     Mental Status: She is alert and oriented to person, place, and time.     Coordination: Coordination normal.  Psychiatric:        Behavior: Behavior normal.     Urinalysis: Greater than 30 WBCs, 3-10 RBCs, many bacteria, 3+ leukocytes, 1+ blood and 2+ protein  Assessment & Plan:   Problem List Items Addressed This Visit   None Visit Diagnoses       Acute non intractable tension-type headache    -  Primary     Confusion       Relevant Orders   Urinalysis, Complete   Urine Culture     Likely tension type headache, recommended Tylenol and heating pad and muscle pain gel  Possible UTI, will send  Follow up plan: Return if symptoms worsen or fail to improve.  Counseling provided for all of the vaccine components Orders Placed This Encounter  Procedures   Urine Culture   Urinalysis, Complete    Arville Care, MD [place] [facility]  Family Medicine 08/04/2023, 4:16 PM

## 2023-08-04 NOTE — Telephone Encounter (Signed)
 Tried to call pt to schedule appt but no answer and no vm

## 2023-08-04 NOTE — Telephone Encounter (Signed)
Scheduled appt with husband

## 2023-08-05 ENCOUNTER — Telehealth: Payer: Self-pay

## 2023-08-05 MED ORDER — CEPHALEXIN 500 MG PO CAPS: 500.0000 mg | ORAL_CAPSULE | Freq: Four times a day (QID) | ORAL | 0 refills | Status: DC

## 2023-08-05 NOTE — Addendum Note (Signed)
 Addended by: Dorene Sorrow on: 08/05/2023 09:05 AM   Modules accepted: Orders

## 2023-08-05 NOTE — Telephone Encounter (Signed)
 Copied from CRM [id number]. Topic: Clinical - Prescription Issue >> Aug 04, 2023  4:58 PM DeAngela L wrote: Reason for CRM: Patient's daughter called and notes the only pharmacy used is   [facility] [place], [place] - [address] [place] [address] Phone: [phone] Fax: [phone]  And would like the [facility] deleted from the pharmacy listed within her files so this doesn't happen in the future the prescription was sent to the wrong pharmacy   cephALEXin (KEFLEX) 500 MG capsule the DR prescribed today, please resend to the drug store

## 2023-08-05 NOTE — Telephone Encounter (Signed)
 Keflex sent to [facility] in [place]

## 2023-08-06 LAB — URINE CULTURE

## 2023-08-16 LAB — COMPREHENSIVE METABOLIC PANEL WITH GFR: EGFR (Non-African Amer.): 77

## 2023-08-29 ENCOUNTER — Telehealth: Payer: Self-pay

## 2023-08-29 NOTE — Telephone Encounter (Signed)
 Patient's daughter returning call back, requesting callback   Best callback: [phone] (will not be available until after 2pm)

## 2023-08-29 NOTE — Telephone Encounter (Signed)
 Lmtcb

## 2023-08-29 NOTE — Telephone Encounter (Signed)
 Copied from CRM [id number]. Topic: Clinical - Medication Question >> Aug 29, 2023 10:45 AM Marland Kitchen D wrote: Hospital prescribed Lisprol and  one from the office wants to know if the medication is correct.  Eileen Jennings daughter [phone]

## 2023-08-29 NOTE — Transitions of Care (Post Inpatient/ED Visit) (Signed)
   08/29/2023  Name: [name] MRN: [id number] DOB: Oct 15, 1940  Today's TOC FU Call Status: Today's TOC FU Call Status:: Unsuccessful Call (1st Attempt) Unsuccessful Call (1st Attempt) Date: 08/29/23  Attempted to reach the patient regarding the most recent Inpatient/ED visit.  Follow Up Plan: Additional outreach attempts will be made to reach the patient to complete the Transitions of Care (Post Inpatient/ED visit) call.   Signature  Kandis Fantasia, LPN [facility] Health Advisor [place] [facility] You Are. We Are. [facility] [phone].

## 2023-08-30 ENCOUNTER — Other Ambulatory Visit: Payer: Self-pay | Admitting: Family Medicine

## 2023-08-30 ENCOUNTER — Telehealth: Payer: Self-pay | Admitting: Family Medicine

## 2023-08-30 NOTE — Telephone Encounter (Signed)
 Patient's daughter calling back. Requesting a callback.

## 2023-08-30 NOTE — Telephone Encounter (Signed)
 Spoke to Fenton.  Stop lisinopril BP too low.  Continue Metoprolol for now. Monitor BPs.  She will update me in a few days.  If persistently low, plan to cut metoprolol in half.

## 2023-08-30 NOTE — Telephone Encounter (Unsigned)
 Copied from CRM [id number]. Topic: Clinical - Medication Question >> Aug 30, 2023  8:05 AM Gery Pray wrote: Reason for CRM: Hermenia Bers, daughter, wanting to know which medication is correct for the patient to be taking. Patient was prescribed Lisinopril 5 MG by the hospital and metoprolol succinate (TOPROL-XL) 100 MG 24 hr tablet by her provider. She would like to know of the 2 medications that were prescribed which should the patient be taking or both? Please contact Johnna at [phone].

## 2023-08-31 NOTE — Telephone Encounter (Signed)
 Addressed in another encounter. LS

## 2023-08-31 NOTE — Telephone Encounter (Signed)
Noted  -LS

## 2023-09-01 ENCOUNTER — Ambulatory Visit

## 2023-09-01 ENCOUNTER — Inpatient Hospital Stay: Admitting: Nurse Practitioner

## 2023-09-01 ENCOUNTER — Encounter: Payer: Self-pay | Admitting: Family Medicine

## 2023-09-01 ENCOUNTER — Telehealth: Payer: Self-pay

## 2023-09-01 VITALS — BP 101/55 | HR 99 | Temp 98.3°F | Ht 62.0 in | Wt 143.0 lb

## 2023-09-01 DIAGNOSIS — R1310 Dysphagia, unspecified: Secondary | ICD-10-CM

## 2023-09-01 DIAGNOSIS — A419 Sepsis, unspecified organism: Secondary | ICD-10-CM

## 2023-09-01 DIAGNOSIS — F4320 Adjustment disorder, unspecified: Secondary | ICD-10-CM | POA: Diagnosis not present

## 2023-09-01 DIAGNOSIS — Z09 Encounter for follow-up examination after completed treatment for conditions other than malignant neoplasm: Secondary | ICD-10-CM

## 2023-09-01 DIAGNOSIS — F02A0 Dementia in other diseases classified elsewhere, mild, without behavioral disturbance, psychotic disturbance, mood disturbance, and anxiety: Secondary | ICD-10-CM

## 2023-09-01 DIAGNOSIS — M79605 Pain in left leg: Secondary | ICD-10-CM

## 2023-09-01 DIAGNOSIS — R633 Feeding difficulties, unspecified: Secondary | ICD-10-CM

## 2023-09-01 DIAGNOSIS — G301 Alzheimer's disease with late onset: Secondary | ICD-10-CM

## 2023-09-01 DIAGNOSIS — N390 Urinary tract infection, site not specified: Secondary | ICD-10-CM

## 2023-09-01 DIAGNOSIS — M79604 Pain in right leg: Secondary | ICD-10-CM

## 2023-09-01 DIAGNOSIS — Z8673 Personal history of transient ischemic attack (TIA), and cerebral infarction without residual deficits: Secondary | ICD-10-CM

## 2023-09-01 DIAGNOSIS — G44229 Chronic tension-type headache, not intractable: Secondary | ICD-10-CM

## 2023-09-01 MED ORDER — MIRTAZAPINE 7.5 MG PO TABS: 7.5000 mg | ORAL_TABLET | Freq: Every day | ORAL | 3 refills | Status: DC

## 2023-09-01 NOTE — Progress Notes (Addendum)
 Subjective: CC: Hospital discharge follow-up PCP: Eliodoro Guerin, DO Eileen Jennings is a 83 y.o. female presenting to clinic today for:  1.  Discharge follow-up for sepsis secondary to UTI She is brought to the office by her daughter who notes that she has been doing okay since getting home from the rehab facility in [place].  She has not had any recurrent falls.  She has been complaining about a lot of leg pain.  They look a little discolored compared to normal today.  She is no longer swelling like she was.  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  She also takes gabapentin  3 times daily.  She also reports to me that she has been having some swallowing issues where she seems to be choking on things more easily since discharge.  They are getting home health services but she is not quite sure who the home health agency is.  She continues to have chronic posterior headaches.  She had imaging of the brain done which demonstrated no abnormalities that would be contributing.  Again she takes prednisone  daily and gabapentin  as above.  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  She takes BuSpar  as needed anxiety   ROS: Per HPI  Allergies  Allergen Reactions   Hydrocodone-Acetaminophen  Nausea And Vomiting   Past Medical History:  Diagnosis Date   Anemia    comes and goes   Anxiety    takes Ativan    Arthritis    Hx osteoarthritis   Blood transfusion age 41   tonsillectomy as child   Depression    takes Cymbalta    Drug overdose 08/01/2013   Fibromyalgia    pt. had polymyalgia not fibromyalgia   Headache(784.0)    takes Goody powders   Hypertension    takes Metoprolol    PONV (postoperative nausea and vomiting)    UTI (lower urinary tract infection) 08/01/2013    Current Outpatient Medications:    atorvastatin  (LIPITOR) 80 MG tablet, Take 1 tablet (80 mg total) by mouth daily., Disp: 90 tablet,  Rfl: 3   B Complex Vitamins (VITAMIN B COMPLEX PO), Take by mouth., Disp: , Rfl:    busPIRone  (BUSPAR ) 15 MG tablet, TAKE ONE (1) TABLET BY MOUTH 3 TIMES DAILY, Disp: 270 tablet, Rfl: 1   cephALEXin  (KEFLEX ) 500 MG capsule, Take 1 capsule (500 mg total) by mouth 4 (four) times daily., Disp: 28 capsule, Rfl: 0   cholecalciferol (VITAMIN D3) 25 MCG (1000 UNIT) tablet, Take 1,000 Units by mouth daily., Disp: , Rfl:    denosumab  (PROLIA ) 60 MG/ML SOSY injection, 60mg  Subcutaneous every 6 months, Disp: , Rfl:    ferrous sulfate 325 (65 FE) MG tablet, Take 325 mg by mouth daily with breakfast., Disp: , Rfl:    gabapentin  (NEURONTIN ) 300 MG capsule, TAKE 1 CAPSULE BY MOUTH THREE TIMES A DAY, Disp: 270 capsule, Rfl: 1   Melatonin 3 MG CAPS, Take 1 capsule (3 mg total) by mouth at bedtime as needed (insomnia)., Disp: 30 capsule, Rfl: 0   memantine  (NAMENDA ) 10 MG tablet, Take 1 tablet (10 mg total) by mouth 2 (two) times daily., Disp: 180 tablet, Rfl: 3   metoprolol  succinate (TOPROL -XL) 100 MG 24 hr tablet, Take 1 tablet (100 mg total) by mouth every evening., Disp: 90 tablet, Rfl: 3   predniSONE  (DELTASONE ) 5 MG tablet, Take 5 mg by mouth every morning. , Disp: , Rfl:  Social History   Socioeconomic History   Marital status: Married    Spouse name: Not on file   Number of children: Not on file   Years of education: Not on file   Highest education level: Not on file  Occupational History   Not on file  Tobacco Use   Smoking status: Former    Current packs/day: 0.00    Average packs/day: 0.5 packs/day for 8.0 years (4.0 ttl pk-yrs)    Types: Cigarettes    Start date: 03/29/1963    Quit date: 03/29/1971    Years since quitting: 52.4   Smokeless tobacco: Never  Vaping Use   Vaping status: Never Used  Substance and Sexual Activity   Alcohol use: No   Drug use: No   Sexual activity: Never  Other Topics Concern   Not on file  Social History Narrative   Not on file   Social Drivers of Health    Financial Resource Strain: Low Risk  (03/22/2022)   Overall Financial Resource Strain (CARDIA)    Difficulty of Paying Living Expenses: Not hard at all  Food Insecurity: No Food Insecurity (08/05/2023)   Received from [facility]   Hunger Vital Sign    Worried About Running Out of Food in the Last Year: Never true    Ran Out of Food in the Last Year: Never true  Transportation Needs: No Transportation Needs (08/05/2023)   Received from [facility]   PRAPARE - Transportation    Lack of Transportation (Medical): No    Lack of Transportation (Non-Medical): No  Physical Activity: Inactive (03/22/2022)   Exercise Vital Sign    Days of Exercise per Week: 0 days    Minutes of Exercise per Session: 0 min  Stress: No Stress Concern Present (03/22/2022)   [employer] of Occupational Health - Occupational Stress Questionnaire    Feeling of Stress : Not at all  Social Connections: Moderately Integrated (03/22/2022)   Social Connection and Isolation Panel [NHANES]    Frequency of Communication with Friends and Family: More than three times a week    Frequency of Social Gatherings with Friends and Family: More than three times a week    Attends Religious Services: More than 4 times per year    Active Member of [employer] or Organizations: No    Attends Banker Meetings: Never    Marital Status: Married  Catering manager Violence: Not At Risk (03/22/2022)   Humiliation, Afraid, Rape, and Kick questionnaire    Fear of Current or Ex-Partner: No    Emotionally Abused: No    Physically Abused: No    Sexually Abused: No   Family History  Problem Relation Age of Onset   CAD Father    Depression Father    Stroke Other    Drug abuse Mother        prescription drug abuse   Breast cancer Neg Hx     Objective: Office vital signs reviewed. BP (!) 101/55   Pulse 99   Temp 98.3 F (36.8 C)   Ht 5\' 2"  (1.575 m)   Wt 143 lb (64.9 kg)   SpO2 96%   BMI 26.16 kg/m    Physical Examination:  General: Awake, alert, nontoxic female, No acute distress HEENT: Glair white.  Moist mucous membranes.  Slight upper left lid ptosis Cardio: regular rate and rhythm, S1S2 heard, no murmurs appreciated Pulm: clear to auscultation bilaterally, no wheezes, rhonchi or rales; normal work of breathing on  room air Extremities: Warm, +1 pedal pulses.  Dusky appearance to the lower legs MSK: Arrives in wheelchair. Neuro: Interactive Psych: Becomes a little tearful when talking about her dog     09/01/2023    3:24 PM 08/04/2023    3:43 PM 05/27/2023   11:40 AM  Depression screen PHQ 2/9  Decreased Interest 0 0 0  Down, Depressed, Hopeless 0 0 0  PHQ - 2 Score 0 0 0  Altered sleeping 0  0  Tired, decreased energy 0  0  Change in appetite 0  0  Feeling bad or failure about yourself  0  0  Trouble concentrating 0  0  Moving slowly or fidgety/restless 0  0  Suicidal thoughts 0  0  PHQ-9 Score 0  0  Difficult doing work/chores Not difficult at all  Not difficult at all      09/01/2023    3:24 PM 05/27/2023   11:40 AM 10/25/2022    3:27 PM 05/14/2022   11:48 AM  GAD 7 : Generalized Anxiety Score  Nervous, Anxious, on Edge 0 0 0 0  Control/stop worrying 0 0 0 0  Worry too much - different things 0 0 0 0  Trouble relaxing 0 0 0 0  Restless 0 0 0 0  Easily annoyed or irritable 0 0 0 0  Afraid - awful might happen 0 0 0 0  Total GAD 7 Score 0 0 0 0  Anxiety Difficulty Not difficult at all Not difficult at all Not difficult at all Not difficult at all    Assessment/ Plan: 83 y.o. female   Sepsis secondary to UTI [facility])  Hospital discharge follow-up  Dysphagia, unspecified type - Plan: Ambulatory referral to Speech Therapy, CANCELED: Ambulatory referral to Speech Therapy  Grief reaction - Plan: mirtazapine  (REMERON ) 7.5 MG tablet  Chronic tension-type headache, not intractable - Plan: mirtazapine  (REMERON ) 7.5 MG tablet  Pain in both lower extremities - Plan: US   ARTERIAL ABI (SCREENING LOWER EXTREMITY)  History of CVA in adulthood - Plan: Ambulatory referral to Speech Therapy, CANCELED: Ambulatory referral to Speech Therapy  Mild late onset Alzheimer's dementia without behavioral disturbance, psychotic disturbance, mood disturbance, or anxiety (HCC) - Plan: Ambulatory referral to Speech Therapy, CANCELED: Ambulatory referral to Speech Therapy  Feeding difficulties - Plan: Ambulatory referral to Speech Therapy  UTI resolved.  She is complete all antibiotics.  She is experiencing some unspecified dysphagia so ongoing to see if we can arrange for her home health to get a speech therapy added for evaluation and treatment.  I have coordinated this with Caryl Clas to arrange for the patient  For her headache, I am going to add mirtazapine .  She is on this many years ago.  I think this may help with her mood, sleep and hopefully chronic headaches  I am going to check ABIs given pain in lower extremities.  She did have some dusky appearance to the legs today with +1 pedal pulses.  **addendum: [facility] does not have SLP. Placing outpatient referral for SLP evaluation.  Orders Placed This Encounter  Procedures   US  ARTERIAL ABI (SCREENING LOWER EXTREMITY)    Standing Status:   Future    Expiration Date:   08/31/2024    Reason for Exam (SYMPTOM  OR DIAGNOSIS REQUIRED):   leg pain, reduced pulses    Preferred Imaging Location?:   [facility]   Ambulatory referral to Speech Therapy    Referral Priority:   Urgent  Referral Type:   Speech Therapy    Referral Reason:   Specialty Services Required    Requested Specialty:   Speech Pathology    Number of Visits Requested:   1   Meds ordered this encounter  Medications   mirtazapine  (REMERON ) 7.5 MG tablet    Sig: Take 1 tablet (7.5 mg total) by mouth at bedtime. For sleep, headache    Dispense:  90 tablet    Refill:  3   Eileen Choy Bambi Bonine, DO [facility] [phone]

## 2023-09-01 NOTE — Telephone Encounter (Signed)
 Verbal okay given to mary with home health

## 2023-09-01 NOTE — Addendum Note (Signed)
 Addended by: Raliegh Ip on: 09/01/2023 04:05 PM   Modules accepted: Orders

## 2023-09-01 NOTE — Patient Instructions (Signed)
 Ankle-Brachial Index Test: What to Know Why am I having this test? The ankle-brachial index (ABI) test checks how well blood is flowing in your legs. This test is used to diagnose peripheral vascular disease (PVD). PVD is also known as peripheral arterial disease (PAD). PVD is when the blood vessels beyond the heart get blocked or harden. What is being tested? The ABI test measures the blood flow in your arms and legs and compares the results. This test shows if blood vessels in your legs are narrow or stiff. How do I prepare for this test? Wear loose, comfortable clothing with shoes that are easy to take off. Do not smoke, vape, or use nicotine or tobacco for at least 30 minutes before the test. Avoid caffeine, alcohol, and lot of activity an hour before the test. What happens during the test?  You will lie on your back with your legs and arms at the level of your heart. You may be told to rest for at least 10 minutes before the test starts. A blood pressure (BP) machine and a small ultrasound device will be used to listen to the blood flow in your arteries. Your BP will be taken several times on both arms and ankles. Your toe may also be tested. Your systolic blood pressures will be recorded. The systolic blood pressure (SBP) is the top number of your BP. This is the pressure inside your arteries when your heart pumps. The highest SBP of the ankle will be divided by the highest SBP of the arm. The result is the ABI. Sometimes, this test will be repeated after you have exercised on a treadmill for 5 minutes. This may cause leg pain. If the index number drops after exercise, this may show that PVD is present. How are the results reported? Your results will be compared with normal results for this test. Each lab has its own range for what's normal. Most lab reports include the normal ranges for each test and a note telling you if yours is high or low. Ask when your test results will be ready and  how to get them. You may need to call or meet with your provider to get your results. What do the results mean? An ABI of 1-1.4 is considered normal. An ABI that is 0.9 or less is usually considered not normal. An ABI that is below the normal range may may mean you have PVD in your legs. Talk with your provider about what your results mean. More tests may be needed. Talk with your health care provider about what your results mean. (In some cases, your health care provider may do more testing to confirm the results.) Questions to ask your health care provider Ask your provider or the department that is doing the test: When will my results be ready? How will I get my results? What are my treatment options? What other tests do I need? What are my next steps? This information is not intended to replace advice given to you by your health care provider. Make sure you discuss any questions you have with your health care provider. Document Revised: 10/25/2022 Document Reviewed: 10/25/2022 Elsevier Patient Education  2024 [employer].

## 2023-09-01 NOTE — Telephone Encounter (Signed)
 Copied from CRM [id number]. Topic: Clinical - Home Health Verbal Orders >> Sep 01, 2023  2:22 PM Pierre Bali B wrote: Caller/Agency: mary from [facility] Callback Number: [phone] Service Requested: Physical Therapy Frequency: 1 week 1 . 2 week 2 . 1 week 3 Any new concerns about the patient? Yes

## 2023-09-09 ENCOUNTER — Ambulatory Visit (HOSPITAL_COMMUNITY)
Admission: RE | Admit: 2023-09-09 | Discharge: 2023-09-09 | Disposition: A | Discharge status: Home / Self Care | Payer: Self-pay | Source: Ambulatory Visit | Attending: Family Medicine | Admitting: Family Medicine

## 2023-09-09 DIAGNOSIS — M79604 Pain in right leg: Secondary | ICD-10-CM | POA: Insufficient documentation

## 2023-09-09 DIAGNOSIS — M79605 Pain in left leg: Secondary | ICD-10-CM | POA: Insufficient documentation

## 2023-09-12 ENCOUNTER — Encounter: Payer: Self-pay | Admitting: Family Medicine

## 2023-09-13 ENCOUNTER — Ambulatory Visit (INDEPENDENT_AMBULATORY_CARE_PROVIDER_SITE_OTHER)

## 2023-09-13 DIAGNOSIS — N390 Urinary tract infection, site not specified: Secondary | ICD-10-CM

## 2023-09-13 DIAGNOSIS — E785 Hyperlipidemia, unspecified: Secondary | ICD-10-CM

## 2023-09-13 DIAGNOSIS — Z79899 Other long term (current) drug therapy: Secondary | ICD-10-CM

## 2023-09-13 DIAGNOSIS — Z7982 Long term (current) use of aspirin: Secondary | ICD-10-CM

## 2023-09-13 DIAGNOSIS — I959 Hypotension, unspecified: Secondary | ICD-10-CM

## 2023-09-13 DIAGNOSIS — D649 Anemia, unspecified: Secondary | ICD-10-CM

## 2023-09-13 DIAGNOSIS — E559 Vitamin D deficiency, unspecified: Secondary | ICD-10-CM

## 2023-09-13 DIAGNOSIS — A419 Sepsis, unspecified organism: Secondary | ICD-10-CM

## 2023-09-13 DIAGNOSIS — N179 Acute kidney failure, unspecified: Secondary | ICD-10-CM | POA: Diagnosis not present

## 2023-09-13 DIAGNOSIS — R6521 Severe sepsis with septic shock: Secondary | ICD-10-CM | POA: Diagnosis not present

## 2023-09-13 DIAGNOSIS — F0394 Unspecified dementia, unspecified severity, with anxiety: Secondary | ICD-10-CM

## 2023-09-13 DIAGNOSIS — I10 Essential (primary) hypertension: Secondary | ICD-10-CM

## 2023-09-14 NOTE — Addendum Note (Signed)
 Addended by: Eliodoro Guerin on: 09/14/2023 10:54 AM   Modules accepted: Orders

## 2023-10-07 ENCOUNTER — Telehealth: Admitting: Family Medicine

## 2023-10-11 ENCOUNTER — Other Ambulatory Visit: Payer: Self-pay | Admitting: Family Medicine

## 2023-10-11 DIAGNOSIS — Z8673 Personal history of transient ischemic attack (TIA), and cerebral infarction without residual deficits: Secondary | ICD-10-CM

## 2023-10-21 ENCOUNTER — Other Ambulatory Visit: Payer: Self-pay | Admitting: Family Medicine

## 2023-10-21 DIAGNOSIS — Z8673 Personal history of transient ischemic attack (TIA), and cerebral infarction without residual deficits: Secondary | ICD-10-CM

## 2023-11-03 ENCOUNTER — Other Ambulatory Visit: Payer: Self-pay | Admitting: Family Medicine

## 2023-11-03 MED ORDER — PREDNISONE 5 MG PO TABS: 5.0000 mg | ORAL_TABLET | ORAL | 0 refills | Status: DC

## 2023-11-08 ENCOUNTER — Ambulatory Visit (INDEPENDENT_AMBULATORY_CARE_PROVIDER_SITE_OTHER): Admitting: Nurse Practitioner

## 2023-11-08 ENCOUNTER — Ambulatory Visit: Payer: Self-pay

## 2023-11-08 ENCOUNTER — Encounter: Payer: Self-pay | Admitting: Nurse Practitioner

## 2023-11-08 VITALS — BP 109/73 | HR 95 | Temp 97.3°F | Ht 62.0 in

## 2023-11-08 DIAGNOSIS — R300 Dysuria: Secondary | ICD-10-CM | POA: Diagnosis not present

## 2023-11-08 DIAGNOSIS — N3001 Acute cystitis with hematuria: Secondary | ICD-10-CM | POA: Diagnosis not present

## 2023-11-08 LAB — URINALYSIS, ROUTINE W REFLEX MICROSCOPIC
Bilirubin, UA: NEGATIVE
Glucose, UA: NEGATIVE
Ketones, UA: NEGATIVE
Nitrite, UA: NEGATIVE
Specific Gravity, UA: 1.02 (ref 1.005–1.030)
Urobilinogen, Ur: 0.2 mg/dL (ref 0.2–1.0)
pH, UA: 7 (ref 5.0–7.5)

## 2023-11-08 LAB — MICROSCOPIC EXAMINATION
Renal Epithel, UA: NONE SEEN /HPF
WBC, UA: >30 /HPF — AB (ref 0–5)
Yeast, UA: NONE SEEN

## 2023-11-08 MED ORDER — DOXYCYCLINE HYCLATE 100 MG PO CAPS
100.0000 mg | ORAL_CAPSULE | Freq: Two times a day (BID) | ORAL | 0 refills | Status: DC
Start: 2023-11-08 — End: 2023-11-28

## 2023-11-08 NOTE — Telephone Encounter (Signed)
 FYI Only or Action Required?: FYI only for provider  Patient was last seen in primary care on 09/01/2023 by Eliodoro Guerin, DO. Called Nurse Triage reporting Dysuria. Symptoms began several days ago. Interventions attempted: OTC medications: tylenol . Symptoms are: unchanged.  Triage Disposition: See Physician Within 24 Hours  Patient/caregiver understands and will follow disposition?: Yes             Copied from CRM [id number]. Topic: Clinical - Red Word Triage >> Nov 08, 2023 11:53 AM Eileen Jennings wrote: Red Word that prompted transfer to Nurse Triage: Burns when urinating and frequent urination for a few days. May have UTI again. Asking if a medication can be called in to [facility] pharmacy or if she needs to be seen. Reason for Disposition  Age > 50 years  Answer Assessment - Initial Assessment Questions 1. SEVERITY: How bad is the pain?  (e.g., Scale 1-10; mild, moderate, or severe)   - MILD (1-3): complains slightly about urination hurting   - MODERATE (4-7): interferes with normal activities     - SEVERE (8-10): excruciating, unwilling or unable to urinate because of the pain      Pt states it hurts when she goes 2. FREQUENCY: How many times have you had painful urination today?      Sitters says she is going more 3. PATTERN: Is pain present every time you urinate or just sometimes?      Every times 4. ONSET: When did the painful urination start?      A couple days ago 5. FEVER: Do you have a fever? If Yes, ask: What is your temperature, how was it measured, and when did it start?     no 6. PAST UTI: Have you had a urine infection before? If Yes, ask: When was the last time? and What happened that time?      Yes a couple months ago 7. CAUSE: What do you think is causing the painful urination?  (e.g., UTI, scratch, Herpes sore)     uti 8. OTHER SYMPTOMS: Do you have any other symptoms? (e.g., blood in urine, flank pain, genital sores, urgency,  vaginal discharge)     frequency  Protocols used: Urination Pain - Female-A-AH

## 2023-11-08 NOTE — Telephone Encounter (Signed)
 Pt has appt scheduled.

## 2023-11-08 NOTE — Progress Notes (Signed)
 Acute Office Visit  Subjective:     Patient ID: Eileen Jennings, female    DOB: [birth year], 83 y.o.   MRN: [id number]  No chief complaint on file.   HPI  Eileen Jennings is a 83 y.o. female presenting June, 17 2025 with her daughter for an acute visit who complains of urinary. Hx of sepsis and was hospitalized in March. Was seen at an urgent care 10/22/2023  and treated for UTI. Per her daughter she finished the course of ATB and still  frequency, urgency and dysuria x 3 days, without flank pain, fever, chills, or abnormal vaginal discharge or bleeding.  She was treated with Keflex  and no culture was done.  Active Ambulatory Problems    Diagnosis Date Noted   Osteoarthritis of knee 03/31/2011   Polymyalgia rheumatica (HCC) 08/01/2013   HTN (hypertension) 08/01/2013   Hyponatremia 08/01/2013   Depression 08/01/2013   Normocytic anemia 08/03/2013   Fibromyalgia 12/08/2021   History of CVA in adulthood 12/08/2021   Generalized anxiety disorder 12/08/2021   Mild late onset Alzheimer's dementia without behavioral disturbance, psychotic disturbance, mood disturbance, or anxiety (HCC) 12/08/2021   Bilateral hearing loss 10/29/2015   Resolved Ambulatory Problems    Diagnosis Date Noted   Acute encephalopathy 08/01/2013   Drug overdose 08/01/2013   Lower urinary tract infectious disease 08/01/2013   Acute pyelonephritis 08/03/2013   Leukocytosis 08/03/2013   Past Medical History:  Diagnosis Date   Anemia    Anxiety    Arthritis    Blood transfusion age 62   Headache(784.0)    Hypertension    PONV (postoperative nausea and vomiting)    UTI (lower urinary tract infection) 08/01/2013      Review of Systems  Constitutional:  Negative for chills and fever.  HENT:  Negative for congestion and sore throat.   Genitourinary:  Positive for dysuria and frequency.  Skin:  Negative for itching and rash.  Neurological:  Negative for dizziness and headaches.   Negative unless indicated in  HPI    Objective:    There were no vitals taken for this visit. BP Readings from Last 3 Encounters:  09/01/23 (!) 101/55  08/04/23 114/78  05/27/23 135/69   Wt Readings from Last 3 Encounters:  09/01/23 143 lb (64.9 kg)  08/04/23 147 lb (66.7 kg)  05/27/23 148 lb (67.1 kg)      Physical Exam Vitals and nursing note reviewed.  Constitutional:      General: She is not in acute distress. HENT:     Head: Normocephalic and atraumatic.     Nose: Nose normal.     Mouth/Throat:     Mouth: Mucous membranes are moist.   Eyes:     General: No scleral icterus.    Extraocular Movements: Extraocular movements intact.     Conjunctiva/sclera: Conjunctivae normal.     Pupils: Pupils are equal, round, and reactive to light.    Cardiovascular:     Heart sounds: Normal heart sounds.  Pulmonary:     Effort: Pulmonary effort is normal.     Breath sounds: Normal breath sounds.   Musculoskeletal:        General: Normal range of motion.     Right lower leg: No edema.     Left lower leg: No edema.   Skin:    General: Skin is warm and dry.     Findings: No rash.   Neurological:     Mental Status: She is alert and oriented  to person, place, and time.   Psychiatric:        Mood and Affect: Mood normal.        Behavior: Behavior normal.        Thought Content: Thought content normal.        Judgment: Judgment normal.    Urine dipstick shows positive for RBC's 2+, positive for 2+ protein, and positive for leukocytes 2+.  Micro exam: >30 WBC's per HPF, 0-2 RBC's per HPF, many+ bacteria, and epithelial cells 0-10.  No results found for any visits on 11/08/23.      Assessment & Plan:  Dysuria -     Urinalysis, Routine w reflex microscopic  Eileen Jennings is a 83 year old Caucasian female seen today for acute cystitis, no acute distress Acute cystitis: Since she just finished Keflex  will prescribe Doxy for 7 days while waiting for urine culture to come back.  Client understand based on  urine culture and no and may be needed Increase hydration,  may use Pyridium OTC prn. Call or return to clinic prn if these symptoms worsen or fail to improve as anticipated. No follow-ups on file.  Eileen Jennings [place] Thompson, DNP [facility] [address] [place], [place] [address] [phone]  Note: This document was prepared by [employer] voice dictation technology and any errors that results from this process are unintentional.

## 2023-11-10 ENCOUNTER — Ambulatory Visit: Payer: Self-pay | Admitting: Nurse Practitioner

## 2023-11-10 DIAGNOSIS — N3001 Acute cystitis with hematuria: Secondary | ICD-10-CM

## 2023-11-13 LAB — URINE CULTURE

## 2023-11-14 MED ORDER — FOSFOMYCIN TROMETHAMINE 3 G PO PACK: 3.0000 g | PACK | Freq: Once | ORAL | 0 refills | Status: AC

## 2023-11-28 ENCOUNTER — Encounter: Payer: Self-pay | Admitting: Family Medicine

## 2023-11-28 ENCOUNTER — Ambulatory Visit (INDEPENDENT_AMBULATORY_CARE_PROVIDER_SITE_OTHER): Payer: Medicare Other | Admitting: Family Medicine

## 2023-11-28 VITALS — BP 92/70 | HR 93 | Temp 98.0°F | Ht 62.0 in | Wt 157.0 lb

## 2023-11-28 DIAGNOSIS — G301 Alzheimer's disease with late onset: Secondary | ICD-10-CM

## 2023-11-28 DIAGNOSIS — M353 Polymyalgia rheumatica: Secondary | ICD-10-CM | POA: Diagnosis not present

## 2023-11-28 DIAGNOSIS — N390 Urinary tract infection, site not specified: Secondary | ICD-10-CM

## 2023-11-28 DIAGNOSIS — M79651 Pain in right thigh: Secondary | ICD-10-CM

## 2023-11-28 DIAGNOSIS — F02A4 Dementia in other diseases classified elsewhere, mild, with anxiety: Secondary | ICD-10-CM

## 2023-11-28 DIAGNOSIS — M79652 Pain in left thigh: Secondary | ICD-10-CM

## 2023-11-28 DIAGNOSIS — R296 Repeated falls: Secondary | ICD-10-CM | POA: Diagnosis not present

## 2023-11-28 MED ORDER — FOSFOMYCIN TROMETHAMINE 3 G PO PACK
3.0000 g | PACK | Freq: Once | ORAL | 0 refills | Status: AC
Start: 2023-11-28 — End: 2023-11-28

## 2023-11-28 NOTE — Progress Notes (Signed)
 Subjective: CC: Recurrent UTIs PCP: Jolinda Norene HERO, DO Eileen Jennings is a 83 y.o. female presenting to clinic today for:  1.  Recurrent UTI Patient is brought to the office by her daughter, who notes that she was seen on June 17 for suspected urinary tract infection.  She had just finished Keflex  and therefore was prescribed doxycycline .  Unfortunately, her urine culture demonstrated vancomycin-resistant Enterococcus and provider attempted on 6/23 to reach patient and inform them that needed to pick up prescription up for Monurol  as an attempt to keep her out of the ER for IV antibiotics.  After multiple attempts to reach patient, this was unsuccessful.  Her daughter reports that there was reportedly a medication ready at [facility] but patient does not use [facility] so she was a bit confused by this.  She does report that her mother's continues to have very dark, foul-smelling urine and that she has been complaining of bilateral thigh pain.  She is also been a bit weaker than normal.  No reports of fevers  2.  Dementia, polymyalgia rheumatica She continues to take prednisone  as prescribed but again has bilateral leg pain as above.  No preceding injury but she does report that recently she slipped out of her bed and tore the skin on the left forearm.  She thinks that she probably needs to get a hospital bed with rails at this point.   ROS: Per HPI  Allergies  Allergen Reactions   Hydrocodone-Acetaminophen  Nausea And Vomiting   Past Medical History:  Diagnosis Date   Anemia    comes and goes   Anxiety    takes Ativan    Arthritis    Hx osteoarthritis   Blood transfusion age 48   tonsillectomy as child   Depression    takes Cymbalta    Drug overdose 08/01/2013   Fibromyalgia    pt. had polymyalgia not fibromyalgia   Headache(784.0)    takes Goody powders   Hypertension    takes Metoprolol    PONV (postoperative nausea and vomiting)    UTI (lower urinary tract infection) 08/01/2013     Current Outpatient Medications:    atorvastatin  (LIPITOR) 80 MG tablet, Take 1 tablet (80 mg total) by mouth daily., Disp: 90 tablet, Rfl: 3   B Complex Vitamins (VITAMIN B COMPLEX PO), Take by mouth., Disp: , Rfl:    busPIRone  (BUSPAR ) 15 MG tablet, TAKE ONE (1) TABLET BY MOUTH 3 TIMES DAILY, Disp: 270 tablet, Rfl: 1   cholecalciferol (VITAMIN D3) 25 MCG (1000 UNIT) tablet, Take 1,000 Units by mouth daily., Disp: , Rfl:    denosumab  (PROLIA ) 60 MG/ML SOSY injection, 60mg  Subcutaneous every 6 months, Disp: , Rfl:    ferrous sulfate 325 (65 FE) MG tablet, Take 325 mg by mouth daily with breakfast., Disp: , Rfl:    fosfomycin (MONUROL ) 3 g PACK, Take 3 g by mouth once for 1 dose., Disp: 3 g, Rfl: 0   gabapentin  (NEURONTIN ) 300 MG capsule, TAKE 1 CAPSULE BY MOUTH THREE TIMES A DAY, Disp: 270 capsule, Rfl: 1   Melatonin 3 MG CAPS, Take 1 capsule (3 mg total) by mouth at bedtime as needed (insomnia)., Disp: 30 capsule, Rfl: 0   memantine  (NAMENDA ) 10 MG tablet, Take 1 tablet (10 mg total) by mouth 2 (two) times daily., Disp: 180 tablet, Rfl: 3   metoprolol  succinate (TOPROL -XL) 100 MG 24 hr tablet, TAKE 1 TABLET BY MOUTH EVERY EVENING, Disp: 90 tablet, Rfl: 0   mirtazapine  (REMERON ) 7.5 MG  tablet, Take 1 tablet (7.5 mg total) by mouth at bedtime. For sleep, headache, Disp: 90 tablet, Rfl: 3   predniSONE  (DELTASONE ) 5 MG tablet, Take 1 tablet (5 mg total) by mouth every morning. Further fills per Dr Mai, Disp: 90 tablet, Rfl: 0 Social History   Socioeconomic History   Marital status: Married    Spouse name: Not on file   Number of children: Not on file   Years of education: Not on file   Highest education level: Not on file  Occupational History   Not on file  Tobacco Use   Smoking status: Former    Current packs/day: 0.00    Average packs/day: 0.5 packs/day for 8.0 years (4.0 ttl pk-yrs)    Types: Cigarettes    Start date: 03/29/1963    Quit date: 03/29/1971    Years since quitting:  52.7   Smokeless tobacco: Never  Vaping Use   Vaping status: Never Used  Substance and Sexual Activity   Alcohol use: No   Drug use: No   Sexual activity: Never  Other Topics Concern   Not on file  Social History Narrative   Not on file   Social Drivers of Health   Financial Resource Strain: Low Risk  (03/22/2022)   Overall Financial Resource Strain (CARDIA)    Difficulty of Paying Living Expenses: Not hard at all  Food Insecurity: No Food Insecurity (08/05/2023)   Received from [facility]   Hunger Vital Sign    Within the past 12 months, you worried that your food would run out before you got the money to buy more.: Never true    Within the past 12 months, the food you bought just didn't last and you didn't have money to get more.: Never true  Transportation Needs: No Transportation Needs (08/05/2023)   Received from [facility]   PRAPARE - Transportation    Lack of Transportation (Medical): No    Lack of Transportation (Non-Medical): No  Physical Activity: Inactive (03/22/2022)   Exercise Vital Sign    Days of Exercise per Week: 0 days    Minutes of Exercise per Session: 0 min  Stress: No Stress Concern Present (03/22/2022)   [employer] of Occupational Health - Occupational Stress Questionnaire    Feeling of Stress : Not at all  Social Connections: Moderately Integrated (03/22/2022)   Social Connection and Isolation Panel    Frequency of Communication with Friends and Family: More than three times a week    Frequency of Social Gatherings with Friends and Family: More than three times a week    Attends Religious Services: More than 4 times per year    Active Member of [employer] or Organizations: No    Attends Banker Meetings: Never    Marital Status: Married  Catering manager Violence: Not At Risk (03/22/2022)   Humiliation, Afraid, Rape, and Kick questionnaire    Fear of Current or Ex-Partner: No    Emotionally Abused: No    Physically Abused:  No    Sexually Abused: No   Family History  Problem Relation Age of Onset   CAD Father    Depression Father    Stroke Other    Drug abuse Mother        prescription drug abuse   Breast cancer Neg Hx     Objective: Office vital signs reviewed. BP 92/70   Pulse 93   Temp 98 F (36.7 C)   Ht 5' 2 (1.575 m)  Wt 157 lb (71.2 kg)   SpO2 99%   BMI 28.72 kg/m   Physical Examination:  General: Awake, alert, nontoxic elderly female, No acute distress HEENT: Sclera white.  Moist mucous membranes Cardio: regular rate and rhythm, S1S2 heard, no murmurs appreciated Pulm: clear to auscultation bilaterally, no wheezes, rhonchi or rales; normal work of breathing on room air GI: soft, non-tender, non-distended, bowel sounds present x4, no hepatomegaly, no splenomegaly, no masses GU: No suprapubic tenderness to palpation.  No CVA tenderness palpation. MSK: Arrives in wheelchair.  No gross palpable abnormalities to the thighs.  She does have some skin changes from using heating pads  Assessment/ Plan: 83 y.o. 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  I would like to check renal function, liver enzymes, CBC given what sounds like a UTI that has not been treated for several weeks.  I instructed the daughter to go and pick up Monurol  and to monitor for any improvement over the next 48 hours.  She has any worsening symptoms over the next 48 hours she is to seek immediate medical attention in the ER as she  will need IV antibiotics for this multidrug-resistant infection.  Will look for any electrolyte abnormalities and B12 deficiency but expected bilateral thigh pain is likely related to underlying autoimmune disease, physical deconditioning and age.  She is getting an appointment scheduled with her rheumatologist ASAP   Norene CHRISTELLA Fielding, DO [facility] [phone]

## 2023-11-29 ENCOUNTER — Ambulatory Visit: Payer: Self-pay | Admitting: Family Medicine

## 2023-11-29 LAB — CMP14+EGFR
ALT: 29 IU/L (ref 0–32)
AST: 51 IU/L — ABNORMAL HIGH (ref 0–40)
Albumin: 4 g/dL (ref 3.7–4.7)
Alkaline Phosphatase: 61 IU/L (ref 44–121)
BUN/Creatinine Ratio: 5 — ABNORMAL LOW (ref 12–28)
BUN: 7 mg/dL — ABNORMAL LOW (ref 8–27)
Bilirubin Total: 0.3 mg/dL (ref 0.0–1.2)
CO2: 21 mmol/L (ref 20–29)
Calcium: 9.6 mg/dL (ref 8.7–10.3)
Chloride: 99 mmol/L (ref 96–106)
Creatinine, Ser: 1.47 mg/dL — ABNORMAL HIGH (ref 0.57–1.00)
Globulin, Total: 3.8 g/dL (ref 1.5–4.5)
Glucose: 80 mg/dL (ref 70–99)
Potassium: 5.1 mmol/L (ref 3.5–5.2)
Sodium: 138 mmol/L (ref 134–144)
Total Protein: 7.8 g/dL (ref 6.0–8.5)
eGFR: 35 mL/min/1.73 — ABNORMAL LOW (ref >59)

## 2023-11-29 LAB — CBC
Hematocrit: 42 % (ref 34.0–46.6)
Hemoglobin: 13.7 g/dL (ref 11.1–15.9)
MCH: 32.9 pg (ref 26.6–33.0)
MCHC: 32.6 g/dL (ref 31.5–35.7)
MCV: 101 fL — ABNORMAL HIGH (ref 79–97)
Platelets: 398 x10E3/uL (ref 150–450)
RBC: 4.17 x10E6/uL (ref 3.77–5.28)
RDW: 13.2 % (ref 11.7–15.4)
WBC: 9.6 x10E3/uL (ref 3.4–10.8)

## 2023-11-29 LAB — MAGNESIUM: Magnesium: 2.1 mg/dL (ref 1.6–2.3)

## 2023-11-29 LAB — VITAMIN B12: Vitamin B-12: 850 pg/mL (ref 232–1245)

## 2023-12-02 ENCOUNTER — Encounter: Payer: Self-pay | Admitting: Family Medicine

## 2023-12-02 ENCOUNTER — Telehealth (INDEPENDENT_AMBULATORY_CARE_PROVIDER_SITE_OTHER): Admitting: Family Medicine

## 2023-12-02 DIAGNOSIS — U071 COVID-19: Secondary | ICD-10-CM | POA: Diagnosis not present

## 2023-12-02 MED ORDER — BENZONATATE 100 MG PO CAPS: 100.0000 mg | ORAL_CAPSULE | Freq: Three times a day (TID) | ORAL | 0 refills | Status: DC | PRN

## 2023-12-02 MED ORDER — MOLNUPIRAVIR EUA 200MG CAPSULE: 4.0000 | ORAL_CAPSULE | Freq: Two times a day (BID) | ORAL | 0 refills | Status: AC

## 2023-12-02 NOTE — Progress Notes (Signed)
 Virtual Visit via Video   I connected with patient on 12/02/23 at 0920 by a video enabled telemedicine application and verified that I am speaking with the correct person using two identifiers.  Location patient: Home Location provider: [facility] Persons participating in the virtual visit: Patient and Provider  I discussed the limitations of evaluation and management by telemedicine and the availability of in person appointments. The patient expressed understanding and agreed to proceed.  Subjective:   HPI:  Pt presents today for  Chief Complaint  Patient presents with   Covid Positive   COVID positive at home. Has cough and congestion. Has not been taking anything for symptom management. Symptoms started yesterday.   URI  This is a new problem. The current episode started yesterday. There has been no fever. Associated symptoms include congestion and coughing. She has tried nothing for the symptoms.     Review of Systems  HENT:  Positive for congestion.   Respiratory:  Positive for cough. Negative for shortness of breath.   All other systems reviewed and are negative.    Patient Active Problem List   Diagnosis Date Noted   Fibromyalgia 12/08/2021   History of CVA in adulthood 12/08/2021   Generalized anxiety disorder 12/08/2021   Mild late onset Alzheimer's dementia without behavioral disturbance, psychotic disturbance, mood disturbance, or anxiety (HCC) 12/08/2021   Bilateral hearing loss 10/29/2015   Normocytic anemia 08/03/2013   Polymyalgia rheumatica (HCC) 08/01/2013   HTN (hypertension) 08/01/2013   Hyponatremia 08/01/2013   Depression 08/01/2013   Osteoarthritis of knee 03/31/2011    Social History   Tobacco Use   Smoking status: Former    Current packs/day: 0.00    Average packs/day: 0.5 packs/day for 8.0 years (4.0 ttl pk-yrs)    Types: Cigarettes    Start date: 03/29/1963    Quit date: 03/29/1971    Years since  quitting: 52.7   Smokeless tobacco: Never  Substance Use Topics   Alcohol use: No    Current Outpatient Medications:    benzonatate  (TESSALON  PERLES) 100 MG capsule, Take 1 capsule (100 mg total) by mouth 3 (three) times daily as needed., Disp: 20 capsule, Rfl: 0   molnupiravir  EUA (LAGEVRIO ) 200 mg CAPS capsule, Take 4 capsules (800 mg total) by mouth 2 (two) times daily for 5 days., Disp: 40 capsule, Rfl: 0   atorvastatin  (LIPITOR) 80 MG tablet, Take 1 tablet (80 mg total) by mouth daily., Disp: 90 tablet, Rfl: 3   B Complex Vitamins (VITAMIN B COMPLEX PO), Take by mouth., Disp: , Rfl:    busPIRone  (BUSPAR ) 15 MG tablet, TAKE ONE (1) TABLET BY MOUTH 3 TIMES DAILY, Disp: 270 tablet, Rfl: 1   cholecalciferol (VITAMIN D3) 25 MCG (1000 UNIT) tablet, Take 1,000 Units by mouth daily., Disp: , Rfl:    denosumab  (PROLIA ) 60 MG/ML SOSY injection, 60mg  Subcutaneous every 6 months, Disp: , Rfl:    ferrous sulfate 325 (65 FE) MG tablet, Take 325 mg by mouth daily with breakfast., Disp: , Rfl:    gabapentin  (NEURONTIN ) 300 MG capsule, TAKE 1 CAPSULE BY MOUTH THREE TIMES A DAY, Disp: 270 capsule, Rfl: 1   Melatonin 3 MG CAPS, Take 1 capsule (3 mg total) by mouth at bedtime as needed (insomnia)., Disp: 30 capsule, Rfl: 0   memantine  (NAMENDA ) 10 MG tablet, Take 1 tablet (10 mg total) by mouth 2 (two) times daily., Disp: 180 tablet, Rfl: 3   metoprolol  succinate (TOPROL -XL) 100 MG 24  hr tablet, TAKE 1 TABLET BY MOUTH EVERY EVENING, Disp: 90 tablet, Rfl: 0   mirtazapine  (REMERON ) 7.5 MG tablet, Take 1 tablet (7.5 mg total) by mouth at bedtime. For sleep, headache, Disp: 90 tablet, Rfl: 3   predniSONE  (DELTASONE ) 5 MG tablet, Take 1 tablet (5 mg total) by mouth every morning. Further fills per Dr Mai, Disp: 90 tablet, Rfl: 0  Allergies  Allergen Reactions   Hydrocodone-Acetaminophen  Nausea And Vomiting    Objective:   There were no vitals taken for this visit.  Patient is well-developed,  well-nourished in no acute distress.  Resting comfortably at home.  Head is normocephalic, atraumatic.  No labored breathing.  Speech is clear and coherent with logical content.  Patient is alert and oriented at baseline.    Assessment and Plan:   Briyonna was seen today for covid positive.  Diagnoses and all orders for this visit:  Positive self-administered antigen test for COVID-19 GFR is declined will prescribe below instead of Paxlovid. Will send in below as pt is high risk for developing significant illness / complications due to age and comorbid conditions. Tessalon  as needed for cough. Daughter aware that if pt worsens to seek in person evaluation.  -     molnupiravir  EUA (LAGEVRIO ) 200 mg CAPS capsule; Take 4 capsules (800 mg total) by mouth 2 (two) times daily for 5 days. -     benzonatate  (TESSALON  PERLES) 100 MG capsule; Take 1 capsule (100 mg total) by mouth 3 (three) times daily as needed.      Return if symptoms worsen or fail to improve.  Rosaline Bruns, FNP-C [facility] [address] [place], [place] [address] [phone]  12/02/2023  Time spent with the patient: 12 minutes, of which >50% was spent in obtaining information about symptoms, reviewing previous labs, evaluations, and treatments, counseling about condition (please see the discussed topics above), and developing a plan to further investigate it; had a number of questions which I addressed.

## 2023-12-14 ENCOUNTER — Telehealth: Payer: Self-pay

## 2023-12-14 DIAGNOSIS — N390 Urinary tract infection, site not specified: Secondary | ICD-10-CM

## 2023-12-14 NOTE — Telephone Encounter (Signed)
 Copied from CRM #[id number]. Topic: Clinical - Medical Advice >> Dec 14, 2023 10:22 AM Sophia H wrote: Reason for CRM: Medical POA [name] is calling in on behalf of the patient to let Dr. Jolinda know that the patients UTI had cleared and is now back. Wants to know if the patient should go back on a daily antibiotic to treat it? Please advise. # [phone] [name] Merlynn Moose  [facility] - [place], [place] - [address]

## 2023-12-14 NOTE — Telephone Encounter (Signed)
 I think at this point she probably needs to see a urologist because she has a recurrent UTI that is multidrug-resistant and does not respond to the typical daily preventative medications we use.  I am not sure what they would in fact prescribe her to prevent the 1 that she has from recurring

## 2023-12-15 NOTE — Telephone Encounter (Signed)
 Called and spoke with Eileen Jennings she is aware and understanding she does not care where the referral goes so will place the referral they asked it be put in urgent so I will place

## 2023-12-15 NOTE — Addendum Note (Signed)
 Addended by: MICHELINE ROSINA FALCON on: 12/15/2023 08:34 AM   Modules accepted: Orders

## 2023-12-25 ENCOUNTER — Other Ambulatory Visit: Payer: Self-pay

## 2023-12-25 ENCOUNTER — Emergency Department (HOSPITAL_COMMUNITY)
Admission: EM | Admit: 2023-12-25 | Discharge: 2023-12-25 | Disposition: A | Discharge status: Home / Self Care | Attending: Emergency Medicine | Admitting: Emergency Medicine

## 2023-12-25 DIAGNOSIS — Z79899 Other long term (current) drug therapy: Secondary | ICD-10-CM | POA: Diagnosis not present

## 2023-12-25 DIAGNOSIS — I10 Essential (primary) hypertension: Secondary | ICD-10-CM | POA: Insufficient documentation

## 2023-12-25 DIAGNOSIS — G309 Alzheimer's disease, unspecified: Secondary | ICD-10-CM | POA: Diagnosis not present

## 2023-12-25 DIAGNOSIS — D72829 Elevated white blood cell count, unspecified: Secondary | ICD-10-CM | POA: Insufficient documentation

## 2023-12-25 DIAGNOSIS — F0280 Dementia in other diseases classified elsewhere without behavioral disturbance: Secondary | ICD-10-CM | POA: Diagnosis not present

## 2023-12-25 DIAGNOSIS — N3001 Acute cystitis with hematuria: Secondary | ICD-10-CM | POA: Diagnosis not present

## 2023-12-25 DIAGNOSIS — R8290 Unspecified abnormal findings in urine: Secondary | ICD-10-CM | POA: Diagnosis present

## 2023-12-25 LAB — BASIC METABOLIC PANEL WITH GFR
Anion gap: 12 (ref 5–15)
BUN: 15 mg/dL (ref 8–23)
CO2: 24 mmol/L (ref 22–32)
Calcium: 9.1 mg/dL (ref 8.9–10.3)
Chloride: 103 mmol/L (ref 98–111)
Creatinine, Ser: 1.03 mg/dL — ABNORMAL HIGH (ref 0.44–1.00)
GFR, Estimated: 54 mL/min — ABNORMAL LOW (ref >60)
Glucose, Bld: 109 mg/dL — ABNORMAL HIGH (ref 70–99)
Potassium: 3.9 mmol/L (ref 3.5–5.1)
Sodium: 139 mmol/L (ref 135–145)

## 2023-12-25 LAB — URINALYSIS, ROUTINE W REFLEX MICROSCOPIC
Bacteria, UA: NONE SEEN
Bilirubin Urine: NEGATIVE
Glucose, UA: NEGATIVE mg/dL
Ketones, ur: NEGATIVE mg/dL
Nitrite: POSITIVE — AB
Protein, ur: 100 mg/dL — AB
Specific Gravity, Urine: 1.015 (ref 1.005–1.030)
WBC, UA: >50 WBC/hpf (ref 0–5)
pH: 6 (ref 5.0–8.0)

## 2023-12-25 LAB — CBC
HCT: 45.6 % (ref 36.0–46.0)
Hemoglobin: 14.5 g/dL (ref 12.0–15.0)
MCH: 33.1 pg (ref 26.0–34.0)
MCHC: 31.8 g/dL (ref 30.0–36.0)
MCV: 104.1 fL — ABNORMAL HIGH (ref 80.0–100.0)
Platelets: 450 K/uL — ABNORMAL HIGH (ref 150–400)
RBC: 4.38 MIL/uL (ref 3.87–5.11)
RDW: 14.8 % (ref 11.5–15.5)
WBC: 11.2 K/uL — ABNORMAL HIGH (ref 4.0–10.5)
nRBC: 0 % (ref 0.0–0.2)

## 2023-12-25 LAB — LACTIC ACID, PLASMA: Lactic Acid, Venous: 1.7 mmol/L (ref 0.5–1.9)

## 2023-12-25 MED ORDER — FOSFOMYCIN TROMETHAMINE 3 G PO PACK
3.0000 g | PACK | Freq: Once | ORAL | Status: AC
  Administered 2023-12-25: 3 g via ORAL
  Filled 2023-12-25: qty 3

## 2023-12-25 MED ORDER — FOSFOMYCIN TROMETHAMINE 3 G PO PACK: 3.0000 g | PACK | Freq: Once | ORAL | 0 refills | Status: AC

## 2023-12-25 NOTE — ED Provider Notes (Signed)
 [place] EMERGENCY DEPARTMENT AT [facility] Provider Note   CSN: [id number] Arrival date & time: 12/25/23  1147     Patient presents with: Urinary Tract Infection   Eileen Jennings is a 83 y.o. female.  {Add pertinent medical, surgical, social history, OB history to [id number]}  Urinary Tract Infection      Prior to Admission medications   Medication Sig Start Date End Date Taking? Authorizing Provider  atorvastatin  (LIPITOR) 80 MG tablet Take 1 tablet (80 mg total) by mouth daily. 10/25/22   Jolinda Norene HERO, DO  B Complex Vitamins (VITAMIN B COMPLEX PO) Take by mouth.    [provider]  benzonatate  (TESSALON  PERLES) 100 MG capsule Take 1 capsule (100 mg total) by mouth 3 (three) times daily as needed. 12/02/23   Severa Rock HERO, FNP  busPIRone  (BUSPAR ) 15 MG tablet TAKE ONE (1) TABLET BY MOUTH 3 TIMES DAILY 08/03/23   Jolinda Norene M, DO  cholecalciferol (VITAMIN D3) 25 MCG (1000 UNIT) tablet Take 1,000 Units by mouth daily.    [provider]  denosumab  (PROLIA ) 60 MG/ML SOSY injection 60mg  Subcutaneous every 6 months    [provider]  ferrous sulfate 325 (65 FE) MG tablet Take 325 mg by mouth daily with breakfast.    [provider]  gabapentin  (NEURONTIN ) 300 MG capsule TAKE 1 CAPSULE BY MOUTH THREE TIMES A DAY 08/03/23   Jolinda Norene M, DO  Melatonin 3 MG CAPS Take 1 capsule (3 mg total) by mouth at bedtime as needed (insomnia). 08/03/13   Armanda Standing, MD  memantine  (NAMENDA ) 10 MG tablet Take 1 tablet (10 mg total) by mouth 2 (two) times daily. 10/25/22   Jolinda Norene HERO, DO  metoprolol  succinate (TOPROL -XL) 100 MG 24 hr tablet TAKE 1 TABLET BY MOUTH EVERY EVENING 10/11/23   Jolinda Norene M, DO  mirtazapine  (REMERON ) 7.5 MG tablet Take 1 tablet (7.5 mg total) by mouth at bedtime. For sleep, headache 09/01/23   Jolinda Norene M, DO  predniSONE  (DELTASONE ) 5 MG tablet Take 1 tablet (5 mg total) by mouth every  morning. Further fills per Dr Mai 11/03/23   Jolinda Norene HERO, DO    Allergies: Hydrocodone-acetaminophen     Review of Systems  Updated Vital Signs BP (!) 128/106   Pulse 77   Temp (!) 97.5 F (36.4 C) (Oral)   Resp 18   Ht 5' 3 (1.6 m)   Wt 68 kg   SpO2 97%   BMI 26.57 kg/m   Physical Exam  (all labs ordered are listed, but only abnormal results are displayed) Labs Reviewed  URINALYSIS, ROUTINE W REFLEX MICROSCOPIC - Abnormal; Notable for the following components:      Result Value   Color, Urine AMBER (*)    APPearance TURBID (*)    Hgb urine dipstick SMALL (*)    Protein, ur 100 (*)    Nitrite POSITIVE (*)    Leukocytes,Ua LARGE (*)    All other components within normal limits  BASIC METABOLIC PANEL WITH GFR - Abnormal; Notable for the following components:   Glucose, Bld 109 (*)    Creatinine, Ser 1.03 (*)    GFR, Estimated 54 (*)    All other components within normal limits  CBC - Abnormal; Notable for the following components:   WBC 11.2 (*)    MCV 104.1 (*)    Platelets 450 (*)    All other components within normal limits  URINE CULTURE  LACTIC ACID,  PLASMA    EKG: None  Radiology: No results found.  {Document cardiac monitor, telemetry assessment procedure when appropriate:32947} Procedures   Medications Ordered in the ED  fosfomycin (MONUROL ) packet 3 g (has no administration in time range)      {Click here for ABCD2, HEART and other calculators REFRESH Note before signing:1}                              Medical Decision Making Amount and/or Complexity of Data Reviewed Labs: ordered. Discussion of management or test interpretation with external provider(s): Consulted with pharmacy regarding last urine culture grew VRE.  Pharmacy recommended fosfomycin here and repeat dose in 3 days.    Risk Prescription drug management.     {Document critical care time when appropriate  Document review of labs and clinical decision tools ie  CHADS2VASC2, etc  Document your independent review of radiology images and any outside records  Document your discussion with family members, caretakers and with consultants  Document social determinants of health affecting pt's care  Document your decision making why or why not admission, treatments were needed:32947:::1}   Final diagnoses:  None    ED Discharge Orders     None

## 2023-12-25 NOTE — Discharge Instructions (Signed)
 You have been given your first dose of antibiotic today.  A prescription has been written for an additional dose to be taken on Wednesday.  I recommend that you drink plenty of water.  You may take Tylenol  as needed for pain.  Please follow-up with your primary care provider for recheck and keep your upcoming appointment with the urologist.  Return to the emergency department for any new or worsening symptoms.

## 2023-12-25 NOTE — ED Triage Notes (Signed)
 Pt sent here from PCP due to urinary symptoms and elevated WBC count. Family stated that her urine has a strong smell and pt is more confused than usual.

## 2023-12-28 LAB — URINE CULTURE: Culture: >=100000 — AB

## 2023-12-29 ENCOUNTER — Telehealth (HOSPITAL_BASED_OUTPATIENT_CLINIC_OR_DEPARTMENT_OTHER): Payer: Self-pay | Admitting: *Deleted

## 2023-12-29 NOTE — Telephone Encounter (Signed)
 Post ED Visit - Positive Culture Follow-up  Culture report reviewed by antimicrobial stewardship pharmacist: [facility] [x]  Mendel Barter, Pharm.D. []  Venetia Gully, Pharm.D., BCPS AQ-ID []  Garrel Crews, Pharm.D., BCPS []  Almarie Lunger, Pharm.D., BCPS []  Bryant, [address].D., BCPS, AAHIVP []  Rosaline Bihari, Pharm.D., BCPS, AAHIVP []  Vernell Meier, PharmD, BCPS []  Latanya Hint, PharmD, BCPS []  Donald Medley, PharmD, BCPS []  Rocky Bold, PharmD []  Dorothyann Alert, PharmD, BCPS []  Morene Babe, PharmD  [facility] []  Rosaline Edison, PharmD []  Romona Bliss, PharmD []  Dolphus Roller, PharmD []  Veva Seip, Rph []  Vernell Daunt) Leonce, PharmD []  Eva Allis, PharmD []  Rosaline Millet, PharmD []  Iantha Batch, PharmD []  Arvin Gauss, PharmD []  Wanda Hasting, PharmD []  Ronal Rav, PharmD []  Rocky Slade, PharmD []  Bard Jeans, PharmD   Positive urine culture Treated with fosfomycin tromethamine , organism sensitive to the same and no further patient follow-up is required at this time.  Lorita Barnie Pereyra 12/29/2023, 11:49 AM

## 2023-12-30 ENCOUNTER — Encounter: Payer: Self-pay | Admitting: Family Medicine

## 2024-01-02 ENCOUNTER — Telehealth (INDEPENDENT_AMBULATORY_CARE_PROVIDER_SITE_OTHER): Admitting: Family Medicine

## 2024-01-02 ENCOUNTER — Encounter: Payer: Self-pay | Admitting: Family Medicine

## 2024-01-02 DIAGNOSIS — F4320 Adjustment disorder, unspecified: Secondary | ICD-10-CM

## 2024-01-02 DIAGNOSIS — Z8673 Personal history of transient ischemic attack (TIA), and cerebral infarction without residual deficits: Secondary | ICD-10-CM

## 2024-01-02 DIAGNOSIS — F02A0 Dementia in other diseases classified elsewhere, mild, without behavioral disturbance, psychotic disturbance, mood disturbance, and anxiety: Secondary | ICD-10-CM

## 2024-01-02 DIAGNOSIS — G301 Alzheimer's disease with late onset: Secondary | ICD-10-CM

## 2024-01-02 DIAGNOSIS — F411 Generalized anxiety disorder: Secondary | ICD-10-CM | POA: Diagnosis not present

## 2024-01-02 DIAGNOSIS — M353 Polymyalgia rheumatica: Secondary | ICD-10-CM

## 2024-01-02 MED ORDER — MEMANTINE HCL 10 MG PO TABS: 10.0000 mg | ORAL_TABLET | Freq: Two times a day (BID) | ORAL | 3 refills | Status: AC

## 2024-01-02 MED ORDER — LORAZEPAM 0.5 MG PO TABS: 0.2500 mg | ORAL_TABLET | Freq: Two times a day (BID) | ORAL | 1 refills | Status: DC | PRN

## 2024-01-02 MED ORDER — METOPROLOL SUCCINATE ER 100 MG PO TB24
100.0000 mg | ORAL_TABLET | Freq: Every evening | ORAL | 3 refills | Status: AC
Start: 2024-01-02 — End: ?

## 2024-01-02 MED ORDER — BUSPIRONE HCL 15 MG PO TABS: 15.0000 mg | ORAL_TABLET | Freq: Three times a day (TID) | ORAL | 1 refills | Status: AC

## 2024-01-02 MED ORDER — GABAPENTIN 300 MG PO CAPS: 300.0000 mg | ORAL_CAPSULE | Freq: Three times a day (TID) | ORAL | 1 refills | Status: AC

## 2024-01-02 NOTE — Progress Notes (Signed)
 MyChart Video visit  Subjective: RR:hmpzq/ anxiety PCP: Jolinda Norene HERO, DO YEP:Eileen Jennings is a 83 y.o. female. Patient provides verbal consent for consult held via video.  Due to COVID-19 pandemic this visit was conducted virtually. This visit type was conducted due to national recommendations for restrictions regarding the COVID-19 Pandemic (e.g. social distancing, sheltering in place) in an effort to limit this patient's exposure and mitigate transmission in our community. All issues noted in this document were discussed and addressed.  A physical exam was not performed with this format.   Location of patient: home Location of provider: [facility] Others present for call: daughter, [name]  1. Grief Spouse passed this am at 10:30a.  She has been anxious and confused.  She takes mirtazapine  and BuSpar  at baseline but she has not been able to settle down.  She has 24/7 care.  No falls.  She has underlying dementia.  ROS: Per HPI  Allergies  Allergen Reactions   Hydrocodone-Acetaminophen  Nausea And Vomiting   Past Medical History:  Diagnosis Date   Anemia    comes and goes   Anxiety    takes Ativan    Arthritis    Hx osteoarthritis   Blood transfusion age 14   tonsillectomy as child   Depression    takes Cymbalta    Drug overdose 08/01/2013   Fibromyalgia    pt. had polymyalgia not fibromyalgia   Headache(784.0)    takes Goody powders   Hypertension    takes Metoprolol    PONV (postoperative nausea and vomiting)    UTI (lower urinary tract infection) 08/01/2013    Current Outpatient Medications:    atorvastatin  (LIPITOR) 80 MG tablet, Take 1 tablet (80 mg total) by mouth daily., Disp: 90 tablet, Rfl: 3   B Complex Vitamins (VITAMIN B COMPLEX PO), Take by mouth., Disp: , Rfl:    benzonatate  (TESSALON  PERLES) 100 MG capsule, Take 1 capsule (100 mg total) by mouth 3 (three) times daily as needed., Disp: 20 capsule, Rfl: 0   busPIRone  (BUSPAR ) 15 MG tablet, TAKE ONE (1) TABLET BY  MOUTH 3 TIMES DAILY, Disp: 270 tablet, Rfl: 1   cholecalciferol (VITAMIN D3) 25 MCG (1000 UNIT) tablet, Take 1,000 Units by mouth daily., Disp: , Rfl:    denosumab  (PROLIA ) 60 MG/ML SOSY injection, 60mg  Subcutaneous every 6 months, Disp: , Rfl:    ferrous sulfate 325 (65 FE) MG tablet, Take 325 mg by mouth daily with breakfast., Disp: , Rfl:    gabapentin  (NEURONTIN ) 300 MG capsule, TAKE 1 CAPSULE BY MOUTH THREE TIMES A DAY, Disp: 270 capsule, Rfl: 1   Melatonin 3 MG CAPS, Take 1 capsule (3 mg total) by mouth at bedtime as needed (insomnia)., Disp: 30 capsule, Rfl: 0   memantine  (NAMENDA ) 10 MG tablet, Take 1 tablet (10 mg total) by mouth 2 (two) times daily., Disp: 180 tablet, Rfl: 3   metoprolol  succinate (TOPROL -XL) 100 MG 24 hr tablet, TAKE 1 TABLET BY MOUTH EVERY EVENING, Disp: 90 tablet, Rfl: 0   mirtazapine  (REMERON ) 7.5 MG tablet, Take 1 tablet (7.5 mg total) by mouth at bedtime. For sleep, headache, Disp: 90 tablet, Rfl: 3   predniSONE  (DELTASONE ) 5 MG tablet, Take 1 tablet (5 mg total) by mouth every morning. Further fills per Dr Mai, Disp: 90 tablet, Rfl: 0  Assessment/ Plan: 83 y.o. female   Grief reaction - Plan: LORazepam  (ATIVAN ) 0.5 MG tablet  Generalized anxiety disorder - Plan: busPIRone  (BUSPAR ) 15 MG tablet, LORazepam  (ATIVAN ) 0.5 MG tablet  Polymyalgia rheumatica (HCC) - Plan: gabapentin  (NEURONTIN ) 300 MG capsule  Mild late onset Alzheimer's dementia without behavioral disturbance, psychotic disturbance, mood disturbance, or anxiety (HCC) - Plan: memantine  (NAMENDA ) 10 MG tablet  History of CVA in adulthood - Plan: metoprolol  succinate (TOPROL -XL) 100 MG 24 hr tablet  Ativan  sent for as needed use.  Use sparingly.  Caution sedation.  Caution increased risk of falls.  Caution increased confusion with medication given patient's age and underlying dementia.  I think this is probably going to be essential use for sedation as needed.  Advised her to follow-up in office if  she needs further refills.  Her daughter voiced good understanding and will follow up appropriately  National narcotic database was reviewed with no red flags  Start time: 1:46pm End time: 1:49pm  Total time spent on patient care (including video visit/ documentation): 5 minutes  Addalynne Golding CHRISTELLA Fielding, DO [facility] [phone]

## 2024-01-04 ENCOUNTER — Telehealth: Admitting: Family Medicine

## 2024-01-05 ENCOUNTER — Ambulatory Visit: Admitting: Urology

## 2024-01-09 ENCOUNTER — Encounter: Payer: Self-pay | Admitting: Family Medicine

## 2024-01-16 ENCOUNTER — Ambulatory Visit: Admitting: Physician Assistant

## 2024-01-18 ENCOUNTER — Other Ambulatory Visit: Payer: Self-pay

## 2024-01-18 ENCOUNTER — Emergency Department (HOSPITAL_COMMUNITY)
Admission: EM | Admit: 2024-01-18 | Discharge: 2024-01-18 | Disposition: A | Discharge status: Home / Self Care | Source: Ambulatory Visit | Attending: Emergency Medicine | Admitting: Emergency Medicine

## 2024-01-18 ENCOUNTER — Encounter (HOSPITAL_COMMUNITY): Payer: Self-pay

## 2024-01-18 DIAGNOSIS — F0390 Unspecified dementia without behavioral disturbance: Secondary | ICD-10-CM | POA: Diagnosis not present

## 2024-01-18 DIAGNOSIS — R519 Headache, unspecified: Secondary | ICD-10-CM | POA: Diagnosis present

## 2024-01-18 DIAGNOSIS — J0100 Acute maxillary sinusitis, unspecified: Secondary | ICD-10-CM | POA: Insufficient documentation

## 2024-01-18 NOTE — ED Provider Notes (Signed)
 [facility] AT [facility] Provider Note   CSN: [id number] Arrival date & time: 01/18/24  1810     Patient presents with: Headache   Eileen Jennings is a 83 y.o. female.   Patient has a history of dementia.  She is has had chronic headaches for many months.  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  Patient was prescribed Keflex  and given Rocephin .  Her hospice nurse told her that she needed to come back to the emergency department to be rechecked.  Patient complains of a mild headache that has improved now.  The history is provided by the patient and medical records. No language interpreter was used.  Headache Pain location:  Frontal Quality:  Dull Radiates to:  Does not radiate Severity currently:  2/10 Severity at highest:  4/10 Onset quality:  Gradual Timing:  Intermittent Progression:  Waxing and waning Chronicity:  Chronic Similar to prior headaches: yes   Associated symptoms: no abdominal pain, no back pain, no congestion, no cough, no diarrhea, no fatigue, no seizures and no sinus pressure        Prior to Admission medications   Medication Sig Start Date End Date Taking? Authorizing Provider  atorvastatin  (LIPITOR) 80 MG tablet Take 1 tablet (80 mg total) by mouth daily. 10/25/22   Jolinda Norene HERO, DO  B Complex Vitamins (VITAMIN B COMPLEX PO) Take by mouth.    [provider]  busPIRone  (BUSPAR ) 15 MG tablet Take 1 tablet (15 mg total) by mouth 3 (three) times daily. 01/02/24   Jolinda Norene HERO, DO  cholecalciferol (VITAMIN D3) 25 MCG (1000 UNIT) tablet Take 1,000 Units by mouth daily.    [provider]  denosumab  (PROLIA ) 60 MG/ML SOSY injection 60mg  Subcutaneous every 6 months    [provider]  ferrous sulfate 325 (65 FE) MG tablet Take 325 mg by mouth daily with breakfast.    [provider]  gabapentin  (NEURONTIN ) 300 MG capsule Take 1 capsule (300 mg total) by mouth 3 (three) times daily. 01/02/24   Jolinda Norene HERO, DO  LORazepam  (ATIVAN ) 0.5 MG tablet Take 0.5-1 tablets (0.25-0.5 mg total) by mouth 2 (two) times daily as needed for anxiety. 01/02/24   Jolinda Norene HERO, DO  Melatonin 3 MG CAPS Take 1 capsule (3 mg total) by mouth at bedtime as needed (insomnia). 08/03/13   Armanda Standing, MD  memantine  (NAMENDA ) 10 MG tablet Take 1 tablet (10 mg total) by mouth 2 (two) times daily. 01/02/24   Jolinda Norene HERO, DO  metoprolol  succinate (TOPROL -XL) 100 MG 24 hr tablet Take 1 tablet (100 mg total) by mouth every evening. 01/02/24   Jolinda Norene HERO, DO  mirtazapine  (REMERON ) 7.5 MG tablet Take 1 tablet (7.5 mg total) by mouth at bedtime. For sleep, headache 09/01/23   Jolinda Norene M, DO  predniSONE  (DELTASONE ) 5 MG tablet Take 1 tablet (5 mg total) by mouth every morning. Further fills per Dr Mai 11/03/23   Jolinda Norene HERO, DO    Allergies: Hydrocodone-acetaminophen     Review of Systems  Constitutional:  Negative for appetite change and fatigue.  HENT:  Negative for congestion, ear discharge and sinus pressure.   Eyes:  Negative for discharge.  Respiratory:  Negative for cough.   Cardiovascular:  Negative for chest pain.  Gastrointestinal:  Negative for abdominal pain and diarrhea.  Genitourinary:  Negative for frequency and hematuria.  Musculoskeletal:  Negative for back pain.  Skin:  Negative for rash.  Neurological:  Positive for headaches. Negative for seizures.  Psychiatric/Behavioral:  Negative for hallucinations.     Updated Vital Signs BP (!) 153/89 (BP Location: Right Arm)   Pulse 97   Temp 98.8 F (37.1 C) (Oral)   Resp 16   Ht 5' 3 (1.6 m)   Wt 67.1 kg   SpO2 95%   BMI 26.22 kg/m   Physical Exam Vitals and nursing note reviewed.  Constitutional:      Appearance: She is well-developed.  HENT:     Head: Normocephalic.     Nose:  Nose normal.  Eyes:     General: No scleral icterus.    Conjunctiva/sclera: Conjunctivae normal.  Neck:     Thyroid : No thyromegaly.  Cardiovascular:     Rate and Rhythm: Normal rate and regular rhythm.     Heart sounds: No murmur heard.    No friction rub. No gallop.  Pulmonary:     Breath sounds: No stridor. No wheezing or rales.  Chest:     Chest wall: No tenderness.  Abdominal:     General: There is no distension.     Tenderness: There is no abdominal tenderness. There is no rebound.  Musculoskeletal:        General: Normal range of motion.     Cervical back: Neck supple.  Lymphadenopathy:     Cervical: No cervical adenopathy.  Skin:    Findings: No erythema or rash.  Neurological:     Mental Status: She is alert and oriented to person, place, and time.     Motor: No abnormal muscle tone.     Coordination: Coordination normal.  Psychiatric:        Behavior: Behavior normal.     (all labs ordered are listed, but only abnormal results are displayed) Labs Reviewed - No data to display  EKG: None  Radiology: No results found.   Procedures   Medications Ordered in the ED - No data to display                                  Medical Decision Making  Patient with sinusitis seen on CT scan.  She will continue the Keflex  she is taking and she is referred to ENT to be seen next week.     Final diagnoses:  Acute maxillary sinusitis, recurrence not specified    ED Discharge Orders     None          Suzette Pac, MD 01/18/24 2046

## 2024-01-18 NOTE — Discharge Instructions (Addendum)
 Take your antibiotics as prescribed yesterday and take Tylenol  for pain.  Follow-up with the ENT doctor next week.  Call tomorrow and make an appointment to the ENT office in [facility] that your have been referred to. You can see any provider there next week, but the Dr. you have been referred to is Dr. Luciano

## 2024-01-18 NOTE — ED Triage Notes (Signed)
 Pt has had a cough and a headache that has been going on for over a week. Went to [facility] on Monday had a ct scan of head and the palative care nurse told them to bring her for an inner ear problem. Was diagnosed with a uti on Monday as well and is on an antibiotic for it. Here for the headache.

## 2024-01-19 ENCOUNTER — Other Ambulatory Visit: Payer: Self-pay | Admitting: Family Medicine

## 2024-01-19 NOTE — Telephone Encounter (Signed)
 Last OV 11/28/23. Last RF 11/03/23. Next OV 06/06/24

## 2024-01-20 NOTE — Telephone Encounter (Signed)
 Pt aware and she states Mai has already called it in.

## 2024-01-26 ENCOUNTER — Other Ambulatory Visit: Payer: Self-pay | Admitting: Family Medicine

## 2024-01-26 DIAGNOSIS — Z8673 Personal history of transient ischemic attack (TIA), and cerebral infarction without residual deficits: Secondary | ICD-10-CM

## 2024-02-01 ENCOUNTER — Ambulatory Visit: Admitting: Urology

## 2024-02-01 ENCOUNTER — Encounter: Payer: Self-pay | Admitting: Urology

## 2024-02-01 VITALS — BP 117/78 | HR 77

## 2024-02-01 DIAGNOSIS — N390 Urinary tract infection, site not specified: Secondary | ICD-10-CM

## 2024-02-01 DIAGNOSIS — N3281 Overactive bladder: Secondary | ICD-10-CM

## 2024-02-01 LAB — URINALYSIS, ROUTINE W REFLEX MICROSCOPIC
Bilirubin, UA: NEGATIVE
Glucose, UA: NEGATIVE
Ketones, UA: NEGATIVE
Nitrite, UA: NEGATIVE
Specific Gravity, UA: 1.02 (ref 1.005–1.030)
Urobilinogen, Ur: 0.2 mg/dL (ref 0.2–1.0)
pH, UA: 8 — ABNORMAL HIGH (ref 5.0–7.5)

## 2024-02-01 LAB — MICROSCOPIC EXAMINATION: WBC, UA: >30 /HPF — AB (ref 0–5)

## 2024-02-01 MED ORDER — GEMTESA 75 MG PO TABS: 1.0000 | ORAL_TABLET | Freq: Every day | ORAL | Status: DC

## 2024-02-01 MED ORDER — NITROFURANTOIN MACROCRYSTAL 50 MG PO CAPS: 50.0000 mg | ORAL_CAPSULE | Freq: Every day | ORAL | 11 refills | Status: AC

## 2024-02-01 MED ORDER — NITROFURANTOIN MONOHYD MACRO 100 MG PO CAPS: 100.0000 mg | ORAL_CAPSULE | Freq: Two times a day (BID) | ORAL | 0 refills | Status: DC

## 2024-02-01 NOTE — Patient Instructions (Signed)

## 2024-02-01 NOTE — Progress Notes (Signed)
 02/01/2024 2:18 PM   Eileen Jennings 1940/11/01 [id number]  Referring provider: Jolinda Norene HERO, DO [address] [place],  [place] [address]  Frequent UTI   HPI: Eileen Jennings is a 83yo here for evaluation of frequent UTI. 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 Over the past 6 months for has had 4 culture proven UTIs and required hospitalization for sepsis in 07/2023. She is currently on Keflex  for a UTi. She uses 8-10 pads during the day and 2-3 pads per night. She has urge incontinence. UA is concerning for infection.   PMH: Past Medical History:  Diagnosis Date   Anemia    comes and goes   Anxiety    takes Ativan    Arthritis    Hx osteoarthritis   Blood transfusion age 66   tonsillectomy as child   Depression    takes Cymbalta    Drug overdose 08/01/2013   Fibromyalgia    pt. had polymyalgia not fibromyalgia   Headache(784.0)    takes Goody powders   Hypertension    takes Metoprolol    PONV (postoperative nausea and vomiting)    UTI (lower urinary tract infection) 08/01/2013    Surgical History: Past Surgical History:  Procedure Laterality Date   ABDOMINAL HYSTERECTOMY  20 yrs. ago   COLONOSCOPY WITH PROPOFOL  N/A 01/30/2013   Procedure: COLONOSCOPY WITH PROPOFOL ;  Surgeon: Gladis MARLA Louder, MD;  Location: [facility];  Service: Endoscopy;  Laterality: N/A;   CYSTOCELE REPAIR  age 30 with rectocele   ESOPHAGOGASTRODUODENOSCOPY (EGD) WITH PROPOFOL  N/A 01/30/2013   Procedure: ESOPHAGOGASTRODUODENOSCOPY (EGD) WITH PROPOFOL ;  Surgeon: Gladis MARLA Louder, MD;  Location: [facility];  Service: Endoscopy;  Laterality: N/A;   INCONTINENCE SURGERY  4 yrs. ago   RECTOCELE REPAIR  age 55   TONSILLECTOMY     age 66   TOTAL KNEE ARTHROPLASTY  03/31/2011   Procedure: TOTAL KNEE ARTHROPLASTY;  Surgeon: Dempsey GAILS Aluisio;  Location: [facility];  Service: Orthopedics;  Laterality: Left;    Home Medications:  Allergies as of 02/01/2024       Reactions   Hydrocodone-acetaminophen  Nausea And  Vomiting        Medication List        Accurate as of February 01, 2024  2:18 PM. If you have any questions, ask your nurse or doctor.          atorvastatin  80 MG tablet Commonly known as: LIPITOR TAKE ONE (1) TABLET BY MOUTH EVERY DAY   busPIRone  15 MG tablet Commonly known as: BUSPAR  Take 1 tablet (15 mg total) by mouth 3 (three) times daily.   cholecalciferol 25 MCG (1000 UNIT) tablet Commonly known as: VITAMIN D3 Take 1,000 Units by mouth daily.   ferrous sulfate 325 (65 FE) MG tablet Take 325 mg by mouth daily with breakfast.   gabapentin  300 MG capsule Commonly known as: NEURONTIN  Take 1 capsule (300 mg total) by mouth 3 (three) times daily.   LORazepam  0.5 MG tablet Commonly known as: ATIVAN  Take 0.5-1 tablets (0.25-0.5 mg total) by mouth 2 (two) times daily as needed for anxiety.   Melatonin 3 MG Caps Take 1 capsule (3 mg total) by mouth at bedtime as needed (insomnia).   memantine  10 MG tablet Commonly known as: Namenda  Take 1 tablet (10 mg total) by mouth 2 (two) times daily.   metoprolol  succinate 100 MG 24 hr tablet Commonly known as: TOPROL -XL Take 1 tablet (100 mg total) by mouth every evening.  mirtazapine  7.5 MG tablet Commonly known as: REMERON  Take 1 tablet (7.5 mg total) by mouth at bedtime. For sleep, headache   predniSONE  5 MG tablet Commonly known as: DELTASONE  Take 1 tablet (5 mg total) by mouth every morning. Further fills per Dr Mai   Prolia  60 MG/ML Sosy injection Generic drug: denosumab  60mg  Subcutaneous every 6 months   VITAMIN B COMPLEX PO Take by mouth.        Allergies:  Allergies  Allergen Reactions   Hydrocodone-Acetaminophen  Nausea And Vomiting    Family History: Family History  Problem Relation Age of Onset   CAD Father    Depression Father    Stroke Other    Drug abuse Mother        prescription drug abuse   Breast cancer Neg Hx     Social History:  reports that she quit smoking about 52  years ago. Her smoking use included cigarettes. She started smoking about 60 years ago. She has a 4 pack-year smoking history. She has never used smokeless tobacco. She reports that she does not drink alcohol and does not use drugs.  ROS: All other review of systems were reviewed and are negative except what is noted above in HPI  Physical Exam: BP 117/78   Pulse 77   Constitutional:  Alert and oriented, No acute distress. HEENT: Gate AT, moist mucus membranes.  Trachea midline, no masses. Cardiovascular: No clubbing, cyanosis, or edema. Respiratory: Normal respiratory effort, no increased work of breathing. GI: Abdomen is soft, nontender, nondistended, no abdominal masses GU: No CVA tenderness.  Lymph: No cervical or inguinal lymphadenopathy. Skin: No rashes, bruises or suspicious lesions. Neurologic: Grossly intact, no focal deficits, moving all 4 extremities. Psychiatric: Normal mood and affect.  Laboratory Data: Lab Results  Component Value Date   WBC 11.2 (H) 12/25/2023   HGB 14.5 12/25/2023   HCT 45.6 12/25/2023   MCV 104.1 (H) 12/25/2023   PLT 450 (H) 12/25/2023    Lab Results  Component Value Date   CREATININE 1.03 (H) 12/25/2023    No results found for: PSA  No results found for: TESTOSTERONE  Lab Results  Component Value Date   HGBA1C 5.8 (H) 05/27/2023    Urinalysis    Component Value Date/Time   COLORURINE AMBER (A) 12/25/2023 1240   APPEARANCEUR TURBID (A) 12/25/2023 1240   APPEARANCEUR Cloudy (A) 11/08/2023 1414   LABSPEC 1.015 12/25/2023 1240   PHURINE 6.0 12/25/2023 1240   GLUCOSEU NEGATIVE 12/25/2023 1240   HGBUR SMALL (A) 12/25/2023 1240   BILIRUBINUR NEGATIVE 12/25/2023 1240   BILIRUBINUR Negative 11/08/2023 1414   KETONESUR NEGATIVE 12/25/2023 1240   PROTEINUR 100 (A) 12/25/2023 1240   UROBILINOGEN 0.2 07/31/2013 2337   NITRITE POSITIVE (A) 12/25/2023 1240   LEUKOCYTESUR LARGE (A) 12/25/2023 1240    Lab Results  Component Value Date    LABMICR See below: 11/08/2023   WBCUA >30 (A) 11/08/2023   LABEPIT 0-10 11/08/2023   BACTERIA NONE SEEN 12/25/2023    Pertinent Imaging:  No results found for this or any previous visit.  No results found for this or any previous visit.  No results found for this or any previous visit.  No results found for this or any previous visit.  No results found for this or any previous visit.  No results found for this or any previous visit.  No results found for this or any previous visit.  No results found for this or any previous visit.   Assessment &  Plan:    1. Frequent UTI (Primary) Urine for culture -macrobid  100mg  BID for 7 days -we will then start macrodantin  50mg  qhs - Urinalysis, Routine w reflex microscopic  2. OAB -We will trial gemtesa  75mg     No follow-ups on file.  Belvie Clara, MD  [facility] [place]

## 2024-02-17 ENCOUNTER — Telehealth: Payer: Self-pay | Admitting: *Deleted

## 2024-02-17 NOTE — Transitions of Care (Post Inpatient/ED Visit) (Addendum)
   02/17/2024  Name: Eileen Jennings MRN: [id number] DOB: June 07, 1940  Today's TOC FU Call Status: Today's TOC FU Call Status:: Unsuccessful Call (1st Attempt) Unsuccessful Call (1st Attempt) Date: 02/17/24  Attempted to reach the patient regarding the most recent Inpatient/ED visit. Spoke with patient's daughter [name] (HCPOA) who reports they are expecting a video phone visit shortly with another family member and unable to talk, requests call back on Monday 02/20/24.  Follow Up Plan: Additional outreach attempts will be made to reach the patient to complete the Transitions of Care (Post Inpatient/ED visit) call.   [name] Creed [facility], BSN RN Care Manager/ Transition of Care [place]/ [facility] [phone]

## 2024-02-20 ENCOUNTER — Telehealth: Payer: Self-pay | Admitting: *Deleted

## 2024-02-20 NOTE — Transitions of Care (Post Inpatient/ED Visit) (Signed)
   02/20/2024  Name: Eileen Jennings MRN: [id number] DOB: [birth year]  Today's TOC FU Call Status: Today's TOC FU Call Status:: Unsuccessful Call (2nd Attempt) Unsuccessful Call (2nd Attempt) Date: 02/20/24  Attempted to reach the patient regarding the most recent Inpatient/ED visit.  Follow Up Plan: Additional outreach attempts will be made to reach the patient to complete the Transitions of Care (Post Inpatient/ED visit) call.   [name] Creed [facility], BSN RN Care Manager/ Transition of Care [place]/ [facility] [phone]

## 2024-02-21 ENCOUNTER — Telehealth: Payer: Self-pay | Admitting: *Deleted

## 2024-02-21 ENCOUNTER — Inpatient Hospital Stay: Admitting: Nurse Practitioner

## 2024-02-21 NOTE — Transitions of Care (Post Inpatient/ED Visit) (Signed)
   02/21/2024  Name: Eileen Jennings MRN: [id number] DOB: 16-Jun-1940  Today's TOC FU Call Status: Today's TOC FU Call Status:: Unsuccessful Call (3rd Attempt) Unsuccessful Call (3rd Attempt) Date: 02/21/24  Attempted to reach the patient regarding the most recent Inpatient/ED visit.  Follow Up Plan: No further outreach attempts will be made at this time. We have been unable to contact the patient.  [name] Creed [facility], BSN RN Care Manager/ Transition of Care [place]/ [facility] [phone]

## 2024-02-27 ENCOUNTER — Inpatient Hospital Stay: Admitting: Nurse Practitioner

## 2024-02-29 ENCOUNTER — Ambulatory Visit: Payer: Self-pay

## 2024-02-29 ENCOUNTER — Encounter: Payer: Self-pay | Admitting: Urology

## 2024-02-29 NOTE — Telephone Encounter (Signed)
 FYI Only or Action Required?: Action required by provider: update on patient condition.  Patient was last seen in primary care on 01/02/2024 by Jolinda Norene HERO, DO.  Called Nurse Triage reporting Dysphagia.  Symptoms began Ongoing.  Interventions attempted: Other: Thickened liquids.  Symptoms are: gradually worsening.  Triage Disposition: Call PCP When Office is Open  Patient/caregiver understands and will follow disposition?: Yes  **See note below**         Copied from CRM #[id number]. Topic: Clinical - Red Word Triage >> Feb 29, 2024  4:14 PM Myrick T wrote: [facility] that prompted transfer to Nurse Triage: Sonny from [facility] called stated patient is choking more often on her food and liquids. Sonny wants to order speech therapy Reason for Disposition  [1] Caller requesting NON-URGENT health information AND [2] PCP's office is the best resource  Answer Assessment - Initial Assessment Questions 1. REASON FOR CALL: What is the main reason for your call? or How can I best help you?   Sonny with [facility] is calling to inform PCP that patient is chocking on food more often.  Patient is refusing thickener as well. Sonny is suggesting an order for speech therapy. Pt. Does have upcomming appt. With PCP on 10/10.  Protocols used: Information Only Call - No Triage-A-AH

## 2024-03-01 ENCOUNTER — Encounter: Payer: Self-pay | Admitting: Family Medicine

## 2024-03-01 ENCOUNTER — Other Ambulatory Visit: Payer: Self-pay

## 2024-03-01 MED ORDER — GEMTESA 75 MG PO TABS: 1.0000 | ORAL_TABLET | Freq: Every day | ORAL | 11 refills | Status: AC

## 2024-03-02 ENCOUNTER — Ambulatory Visit (INDEPENDENT_AMBULATORY_CARE_PROVIDER_SITE_OTHER): Admitting: Family Medicine

## 2024-03-02 ENCOUNTER — Ambulatory Visit: Payer: Self-pay | Admitting: Family Medicine

## 2024-03-02 ENCOUNTER — Telehealth: Payer: Self-pay

## 2024-03-02 ENCOUNTER — Ambulatory Visit (INDEPENDENT_AMBULATORY_CARE_PROVIDER_SITE_OTHER)

## 2024-03-02 ENCOUNTER — Encounter: Payer: Self-pay | Admitting: Family Medicine

## 2024-03-02 VITALS — BP 126/79 | HR 77 | Temp 97.5°F | Ht 63.0 in | Wt 140.0 lb

## 2024-03-02 DIAGNOSIS — R410 Disorientation, unspecified: Secondary | ICD-10-CM

## 2024-03-02 DIAGNOSIS — G301 Alzheimer's disease with late onset: Secondary | ICD-10-CM

## 2024-03-02 DIAGNOSIS — Z09 Encounter for follow-up examination after completed treatment for conditions other than malignant neoplasm: Secondary | ICD-10-CM

## 2024-03-02 DIAGNOSIS — Z87440 Personal history of urinary (tract) infections: Secondary | ICD-10-CM | POA: Diagnosis not present

## 2024-03-02 DIAGNOSIS — F02B4 Dementia in other diseases classified elsewhere, moderate, with anxiety: Secondary | ICD-10-CM

## 2024-03-02 DIAGNOSIS — R1310 Dysphagia, unspecified: Secondary | ICD-10-CM | POA: Diagnosis not present

## 2024-03-02 LAB — MICROSCOPIC EXAMINATION
Renal Epithel, UA: NONE SEEN /HPF
Yeast, UA: NONE SEEN

## 2024-03-02 LAB — URINALYSIS, ROUTINE W REFLEX MICROSCOPIC
Bilirubin, UA: NEGATIVE
Glucose, UA: NEGATIVE
Ketones, UA: NEGATIVE
Nitrite, UA: NEGATIVE
Protein,UA: NEGATIVE
Specific Gravity, UA: 1.015 (ref 1.005–1.030)
Urobilinogen, Ur: 0.2 mg/dL (ref 0.2–1.0)
pH, UA: 6.5 (ref 5.0–7.5)

## 2024-03-02 MED ORDER — REXULTI 0.5 MG PO TABS: 0.5000 mg | ORAL_TABLET | Freq: Every day | ORAL | 0 refills | Status: AC

## 2024-03-02 MED ORDER — BREXPIPRAZOLE 1 MG PO TABS: 1.0000 mg | ORAL_TABLET | Freq: Every day | ORAL | 0 refills | Status: AC

## 2024-03-02 MED ORDER — REXULTI 0.5 MG PO TABS: ORAL_TABLET | ORAL | 0 refills | Status: DC

## 2024-03-02 MED ORDER — BREXPIPRAZOLE 2 MG PO TABS: 2.0000 mg | ORAL_TABLET | Freq: Every day | ORAL | 0 refills | Status: DC

## 2024-03-02 NOTE — Addendum Note (Signed)
 Addended by: JOLINDA NORENE HERO on: 03/02/2024 03:12 PM   Modules accepted: Orders

## 2024-03-02 NOTE — Telephone Encounter (Signed)
 Copied from CRM [id number]. Topic: Clinical - Lab/Test Results >> Mar 02, 2024  1:09 PM Larissa RAMAN wrote: Reason for CRM: Returning phone call to discuss imaging results, request a callback

## 2024-03-02 NOTE — Addendum Note (Signed)
 Addended by: JOLINDA NORENE HERO on: 03/02/2024 03:06 PM   Modules accepted: Orders

## 2024-03-02 NOTE — Telephone Encounter (Signed)
 Attempted to contact [facility] and the number provided was patients daughters number. Attempted to contact home health and speak with Sonny but unable to reach her. Not sure what office she works out of.

## 2024-03-02 NOTE — Telephone Encounter (Signed)
 NURSE KIM REVIEWED RESULTS WITH PATIENT/DAUGHTER PER RESULT NOTES.

## 2024-03-02 NOTE — Progress Notes (Addendum)
 Subjective: RR:yndepujo d/c follow up PCP: Jolinda Norene HERO, DO YEP:Gnjw R Wallman is a 83 y.o. female presenting to clinic today for: History of Present Illness   Patient was hospitalized at an outside system.  She initially was thought to have sepsis but blood cultures showed contaminants only and no true infection within the bloodstream.  She was treated as SIRS with IV antibiotics for presumed infection and multidrug-resistant urinary tract infection, though her urinalysis demonstrated no concerning features.  Her labs were normal at discharge.  Her daughter messaged us  last evening to inform that she seems to be more confused than normal and she would like her to have repeat urine specimen.  She is also been having increased dysphagia and requests for speech therapy at home health would also be appreciated.   She is brought to the office by her caregiver today who also would like to have COVID testing done as the physical therapist thought they may have heard some crackles in the bottom of her lungs.  She continues to have an intermittent cough.  She has had no fevers.  She does continue to have what appears to be some sundowning and gets more anxious.  The Ativan  was causing some adverse side effects that has not really been utilized much.  She has been given BuSpar  and home mirtazapine .  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, Disp: , Rfl:    denosumab  (PROLIA ) 60 MG/ML SOSY injection, 60mg  Subcutaneous every 6 months, Disp: , Rfl:    ferrous sulfate 325 (65 FE) MG tablet, Take 325 mg by mouth daily with breakfast., Disp: , Rfl:    gabapentin  (NEURONTIN ) 300 MG capsule, Take 1 capsule (300 mg total) by mouth 3 (three) times daily., Disp: 270 capsule, Rfl: 1   LORazepam  (ATIVAN ) 0.5 MG tablet, Take 0.5-1 tablets (0.25-0.5 mg total) by mouth 2 (two) times daily as needed for anxiety., Disp: 20 tablet, Rfl: 1   Melatonin 3 MG CAPS, Take 1 capsule (3 mg total) by mouth at bedtime as needed (insomnia)., Disp: 30 capsule, Rfl: 0   memantine  (NAMENDA ) 10 MG tablet, Take 1 tablet (10 mg total) by mouth 2 (two) times daily., Disp: 180 tablet, Rfl: 3   metoprolol  succinate (TOPROL -XL) 100 MG 24 hr tablet, Take 1 tablet (100 mg total) by mouth every evening., Disp: 90 tablet, Rfl: 3   mirtazapine  (  REMERON ) 7.5 MG tablet, Take 1 tablet (7.5 mg total) by mouth at bedtime. For sleep, headache, Disp: 90 tablet, Rfl: 3   nitrofurantoin  (MACRODANTIN ) 50 MG capsule, Take 1 capsule (50 mg total) by mouth at bedtime., Disp: 30 capsule, Rfl: 11   nitrofurantoin , macrocrystal-monohydrate, (MACROBID ) 100 MG capsule, Take 1 capsule (100 mg total) by mouth every 12 (twelve) hours., Disp: 14 capsule, Rfl: 0   predniSONE  (DELTASONE ) 5 MG tablet, Take 1 tablet (5 mg total) by mouth every morning. Further fills per Dr Mai, Disp: 90 tablet, Rfl: 0   Vibegron  (GEMTESA ) 75 MG TABS, Take 1 tablet (75 mg total) by mouth daily., Disp: 30 tablet, Rfl: 11 Social History    Socioeconomic History   Marital status: Married    Spouse name: Not on file   Number of children: Not on file   Years of education: Not on file   Highest education level: Not on file  Occupational History   Not on file  Tobacco Use   Smoking status: Former    Current packs/day: 0.00    Average packs/day: 0.5 packs/day for 8.0 years (4.0 ttl pk-yrs)    Types: Cigarettes    Start date: 03/29/1963    Quit date: 03/29/1971    Years since quitting: 52.9   Smokeless tobacco: Never  Vaping Use   Vaping status: Never Used  Substance and Sexual Activity   Alcohol use: No   Drug use: No   Sexual activity: Never  Other Topics Concern   Not on file  Social History Narrative   Not on file   Social Drivers of Health   Financial Resource Strain: Low Risk  (03/22/2022)   Overall Financial Resource Strain (CARDIA)    Difficulty of Paying Living Expenses: Not hard at all  Food Insecurity: No Food Insecurity (08/05/2023)   Received from [facility]   Hunger Vital Sign    Within the past 12 months, you worried that your food would run out before you got the money to buy more.: Never true    Within the past 12 months, the food you bought just didn't last and you didn't have money to get more.: Never true  Transportation Needs: No Transportation Needs (08/05/2023)   Received from [facility]   PRAPARE - Transportation    Lack of Transportation (Medical): No    Lack of Transportation (Non-Medical): No  Physical Activity: Inactive (03/22/2022)   Exercise Vital Sign    Days of Exercise per Week: 0 days    Minutes of Exercise per Session: 0 min  Stress: No Stress Concern Present (03/22/2022)   [employer] of Occupational Health - Occupational Stress Questionnaire    Feeling of Stress : Not at all  Social Connections: Moderately Integrated (03/22/2022)   Social Connection and Isolation Panel    Frequency of Communication with Friends and Family: More than three times a week     Frequency of Social Gatherings with Friends and Family: More than three times a week    Attends Religious Services: More than 4 times per year    Active Member of [employer] or Organizations: No    Attends Banker Meetings: Never    Marital Status: Married  Catering manager Violence: Not At Risk (03/22/2022)   Humiliation, Afraid, Rape, and Kick questionnaire    Fear of Current or Ex-Partner: No    Emotionally Abused: No    Physically Abused: No    Sexually Abused: No   Family History  Problem Relation Age of Onset   CAD Father    Depression Father    Stroke Other    Drug abuse Mother        prescription drug abuse   Breast cancer Neg Hx     Objective: Office vital signs reviewed. BP 126/79   Pulse 77   Temp (!) 97.5 F (36.4 C)   Ht 5' 3 (1.6 m)   Wt 140 lb (63.5 kg)   SpO2 92%   BMI 24.80 kg/m   Physical Examination:  General: Awake, alert, nontoxic elderly female, No acute distress HEENT: sclera white, MMM Cardio: regular rate and rhythm, S1S2 heard, no murmurs appreciated Pulm: ?  Mild crackles in the left lower lung field.  Otherwise clear to auscultation bilaterally, no wheezes, rhonchi or rales; normal work of breathing on room air MSK: arrives in wheelchair Neuro: Interacts with provider but is disoriented and frequently asks if her daughter is here and wants to tell her husband, who is deceased, that she is here.  Does not appear to be having any visual hallucinations or auditory hallucinations  Assessment/ Plan: 83 y.o. 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  I will talk to her daughter about possibly adding Rexulti and eliminating some of her other medication.  Will hold off of influenza vaccination today just to make sure that were not missing an action that in fact needs to be treated instead  **Addendum, spoke to patient's POA/ daughter [name].  Wishes to proceed with Rexulti.   Med ordered. STOP Mirtazapine  (only taking 1/2 tablet currently).  Meds ordered this encounter  Medications   DISCONTD: Brexpiprazole (REXULTI) 0.5 MG TABS    Sig: Take 1 tablet (0.5 mg total) by mouth daily for 7 days, THEN 2 tablets (1 mg total) daily for 7 days, THEN 4 tablets (2 mg total) daily for 16 days.    Dispense:  85 tablet    Refill:  0   Brexpiprazole (REXULTI) 0.5 MG TABS    Sig: Take 1 tablet (0.5 mg total) by mouth daily for 7 days. Then go to 1mg     Dispense:  7 tablet    Refill:  0   brexpiprazole (REXULTI) 1 MG TABS tablet    Sig: Take 1 tablet (1 mg total) by mouth daily for 7 days. Week #2    Dispense:  7 tablet    Refill:  0   brexpiprazole (REXULTI) 2 MG TABS tablet    Sig: Take 1 tablet (2 mg total) by mouth daily. Start week #3    Dispense:  30 tablet    Refill:  0   Medications Discontinued During This Encounter  Medication Reason   mirtazapine  (REMERON ) 7.5 MG tablet      No orders of  the defined types were placed in this encounter.  No orders of the defined types were placed in this encounter.  Norene CHRISTELLA Fielding, DO [facility] [phone]

## 2024-03-02 NOTE — Addendum Note (Signed)
 Addended by: SHERRE SUZEN PARAS on: 03/02/2024 11:07 AM   Modules accepted: Orders

## 2024-03-02 NOTE — Telephone Encounter (Signed)
 Ok to provide verbal for speech therapy.

## 2024-03-03 LAB — NOVEL CORONAVIRUS, NAA: SARS-CoV-2, NAA: NOT DETECTED

## 2024-03-04 LAB — URINE CULTURE

## 2024-03-05 ENCOUNTER — Ambulatory Visit

## 2024-03-05 DIAGNOSIS — Z556 Problems related to health literacy: Secondary | ICD-10-CM

## 2024-03-05 DIAGNOSIS — A414 Sepsis due to anaerobes: Secondary | ICD-10-CM | POA: Diagnosis not present

## 2024-03-05 DIAGNOSIS — Z79899 Other long term (current) drug therapy: Secondary | ICD-10-CM

## 2024-03-05 DIAGNOSIS — F0390 Unspecified dementia without behavioral disturbance: Secondary | ICD-10-CM

## 2024-03-05 DIAGNOSIS — I10 Essential (primary) hypertension: Secondary | ICD-10-CM

## 2024-03-05 DIAGNOSIS — N3000 Acute cystitis without hematuria: Secondary | ICD-10-CM | POA: Diagnosis not present

## 2024-03-05 DIAGNOSIS — K219 Gastro-esophageal reflux disease without esophagitis: Secondary | ICD-10-CM

## 2024-03-05 DIAGNOSIS — H9190 Unspecified hearing loss, unspecified ear: Secondary | ICD-10-CM

## 2024-03-05 DIAGNOSIS — Z8673 Personal history of transient ischemic attack (TIA), and cerebral infarction without residual deficits: Secondary | ICD-10-CM

## 2024-03-05 NOTE — Telephone Encounter (Signed)
CALLED PATIENT, NO ANSWER, LEFT MESSAGE TO RETURN CALL 

## 2024-03-06 NOTE — Telephone Encounter (Signed)
 TC to [facility] [employer] spoke to Research scientist (medical) since [place] did not get ph# for [employer]. VO given to add speech.

## 2024-03-06 NOTE — Telephone Encounter (Signed)
 Daughter [name] aware

## 2024-03-09 ENCOUNTER — Telehealth: Payer: Self-pay

## 2024-03-09 NOTE — Telephone Encounter (Signed)
 Copied from CRM #[id number]. Topic: Clinical - Home Health Verbal Orders >> Mar 09, 2024  9:50 AM Leonette SQUIBB wrote: Caller/Agency: Son[facility] Gurney [facility] Callback Number: [phone] Service Requested: Speech Therapy Frequency: 1 week 4 or whatever the doctor thinks Any new concerns about the patient? No

## 2024-03-09 NOTE — Telephone Encounter (Signed)
 Verbal okay given.

## 2024-03-19 ENCOUNTER — Other Ambulatory Visit

## 2024-03-19 ENCOUNTER — Telehealth: Payer: Self-pay

## 2024-03-19 ENCOUNTER — Encounter: Payer: Self-pay | Admitting: Urology

## 2024-03-19 DIAGNOSIS — N390 Urinary tract infection, site not specified: Secondary | ICD-10-CM

## 2024-03-19 LAB — URINALYSIS, ROUTINE W REFLEX MICROSCOPIC
Bilirubin, UA: NEGATIVE
Glucose, UA: NEGATIVE
Ketones, UA: NEGATIVE
Nitrite, UA: POSITIVE — AB
Specific Gravity, UA: 1.02 (ref 1.005–1.030)
Urobilinogen, Ur: 0.2 mg/dL (ref 0.2–1.0)
pH, UA: 6 (ref 5.0–7.5)

## 2024-03-19 LAB — MICROSCOPIC EXAMINATION
Mucus, UA: NONE SEEN
Renal Epithel, UA: NONE SEEN /HPF
WBC, UA: >30 /HPF — AB (ref 0–5)
Yeast, UA: NONE SEEN

## 2024-03-19 NOTE — Telephone Encounter (Signed)
 Called pt daughter to let her know the orders for ua and culture have beens sent in a she can take pt UA to the PCP office since she's closer pt daughter voiced her understanding

## 2024-03-21 LAB — URINE CULTURE

## 2024-03-23 ENCOUNTER — Ambulatory Visit: Payer: Self-pay

## 2024-03-23 MED ORDER — SULFAMETHOXAZOLE-TRIMETHOPRIM 800-160 MG PO TABS: 1.0000 | ORAL_TABLET | Freq: Two times a day (BID) | ORAL | 0 refills | Status: DC

## 2024-03-23 NOTE — Telephone Encounter (Signed)
-----   Message from Belvie Clara sent at 03/23/2024  8:56 AM EDT ----- BACTRIM ----- Message ----- From: Malachy Slice, LPN Sent: [id number]/[id number]   8:36 AM EDT To: Belvie LITTIE Clara, MD  No txt started ----- Message ----- From: Rebecka Memos Lab Results In Sent: 03/19/2024   4:36 PM EDT To: Belvie LITTIE Clara, MD

## 2024-03-23 NOTE — Telephone Encounter (Signed)
 Caregiver made aware of positive urine culture and Bactrim DS sent to  pharmacy per Dr. Sherrilee.

## 2024-03-26 ENCOUNTER — Encounter: Payer: Self-pay | Admitting: Family Medicine

## 2024-03-28 ENCOUNTER — Other Ambulatory Visit: Payer: Self-pay | Admitting: *Deleted

## 2024-03-28 DIAGNOSIS — F4320 Adjustment disorder, unspecified: Secondary | ICD-10-CM

## 2024-03-28 DIAGNOSIS — F411 Generalized anxiety disorder: Secondary | ICD-10-CM

## 2024-03-30 ENCOUNTER — Ambulatory Visit: Payer: Self-pay | Admitting: Family Medicine

## 2024-04-03 ENCOUNTER — Ambulatory Visit: Payer: Self-pay

## 2024-04-03 NOTE — Telephone Encounter (Signed)
 Nurse attempted to call home number: home number is invalid therefore will route this information to PCP office for follow up.

## 2024-04-03 NOTE — Telephone Encounter (Signed)
 FYI Only or Action Required?: FYI only for provider: home health called and reported information: NT attempted to reach out to pt/family no answer and unable to leave voicemail - forwarding information to PCP office.  Patient was last seen in primary care on 03/02/2024 by Jolinda Norene HERO, DO.  Called Nurse Triage reporting Altered Mental Status.  Symptoms began 4 to 5 days.  Interventions attempted: Nothing.  Symptoms are: gradually worsening.  Triage Disposition: See HCP Within 4 Hours (Or PCP Triage)  Patient/caregiver understands and will follow disposition?: Unsure    Copied from CRM #[id number]. Topic: Clinical - Red Word Triage >> Apr 03, 2024  2:44 PM Joesph NOVAK wrote: Red Word that prompted transfer to Nurse Triage:  Dagoberto from [facility]: [phone]  Dagoberto seen patient today and has concerns. She's been seeing her for swallowing issues, but today she is confused, anxious, restless. She's not sleeping well and staying up all night. At her visit, she was complaining about her leg is in pain and a headache. Dagoberto states it is a big change. Reason for Disposition  [1] Acting confused (e.g., disoriented, slurred speech) AND [2] brief (now gone)  Answer Assessment - Initial Assessment Questions 1. LEVEL OF CONSCIOUSNESS: How are they (the patient) acting right now? (e.g., alert-oriented, confused, lethargic, stuporous, comatose)     Confused, anxious, restless 2. ONSET: When did the confusion start?  (e.g., minutes, hours, days)     X 4 to 5 days 3. PATTERN: Does this come and go, or has it been constant since it started?  Is it present now?     constant 4. ALCOHOL or DRUGS: Have they been drinking alcohol or taking any drugs?      na 5. NARCOTIC MEDICINES: Have they been receiving any narcotic medications? (e.g., morphine , Vicodin)     na 6. CAUSE: What do you think is causing the confusion?      unknown 7. OTHER SYMPTOMS: Are there any other  symptoms? (e.g., difficulty breathing, fever, headache, weakness)     Headache, not able to sleep  Big change in patient. C/o headache, bilateral leg pain.  Pt is not following directions well, very confused, agitated. Pt states what is today, why want someone help.  Normally very calm and singing songs, etc.  Protocols used: Confusion - Delirium-A-AH

## 2024-04-03 NOTE — Telephone Encounter (Signed)
 After speaking with home health speech therapy nurse made attempt to cal DPR on cell phone : no answer and unable to leave message due to voicemail full.

## 2024-04-04 ENCOUNTER — Emergency Department (HOSPITAL_COMMUNITY)
Admission: EM | Admit: 2024-04-04 | Discharge: 2024-04-04 | Disposition: A | Discharge status: Home / Self Care | Source: Ambulatory Visit | Attending: Emergency Medicine | Admitting: Emergency Medicine

## 2024-04-04 ENCOUNTER — Other Ambulatory Visit: Payer: Self-pay

## 2024-04-04 ENCOUNTER — Other Ambulatory Visit: Payer: Self-pay | Admitting: Family Medicine

## 2024-04-04 ENCOUNTER — Encounter (HOSPITAL_COMMUNITY): Payer: Self-pay

## 2024-04-04 ENCOUNTER — Ambulatory Visit (INDEPENDENT_AMBULATORY_CARE_PROVIDER_SITE_OTHER): Admitting: Family Medicine

## 2024-04-04 ENCOUNTER — Encounter: Payer: Self-pay | Admitting: Family Medicine

## 2024-04-04 ENCOUNTER — Emergency Department (HOSPITAL_COMMUNITY)

## 2024-04-04 VITALS — BP 118/74 | HR 80 | Temp 97.7°F | Ht 63.0 in

## 2024-04-04 DIAGNOSIS — F411 Generalized anxiety disorder: Secondary | ICD-10-CM

## 2024-04-04 DIAGNOSIS — I10 Essential (primary) hypertension: Secondary | ICD-10-CM | POA: Insufficient documentation

## 2024-04-04 DIAGNOSIS — F03B0 Unspecified dementia, moderate, without behavioral disturbance, psychotic disturbance, mood disturbance, and anxiety: Secondary | ICD-10-CM | POA: Insufficient documentation

## 2024-04-04 DIAGNOSIS — R2990 Unspecified symptoms and signs involving the nervous system: Secondary | ICD-10-CM

## 2024-04-04 DIAGNOSIS — R410 Disorientation, unspecified: Secondary | ICD-10-CM | POA: Diagnosis not present

## 2024-04-04 DIAGNOSIS — G301 Alzheimer's disease with late onset: Secondary | ICD-10-CM

## 2024-04-04 DIAGNOSIS — M79605 Pain in left leg: Secondary | ICD-10-CM | POA: Insufficient documentation

## 2024-04-04 DIAGNOSIS — R29810 Facial weakness: Secondary | ICD-10-CM | POA: Diagnosis not present

## 2024-04-04 DIAGNOSIS — Z7982 Long term (current) use of aspirin: Secondary | ICD-10-CM | POA: Diagnosis not present

## 2024-04-04 DIAGNOSIS — Z8673 Personal history of transient ischemic attack (TIA), and cerebral infarction without residual deficits: Secondary | ICD-10-CM | POA: Insufficient documentation

## 2024-04-04 DIAGNOSIS — R4182 Altered mental status, unspecified: Secondary | ICD-10-CM | POA: Insufficient documentation

## 2024-04-04 DIAGNOSIS — F4320 Adjustment disorder, unspecified: Secondary | ICD-10-CM

## 2024-04-04 DIAGNOSIS — F02B4 Dementia in other diseases classified elsewhere, moderate, with anxiety: Secondary | ICD-10-CM

## 2024-04-04 DIAGNOSIS — N3000 Acute cystitis without hematuria: Secondary | ICD-10-CM | POA: Insufficient documentation

## 2024-04-04 DIAGNOSIS — R531 Weakness: Secondary | ICD-10-CM

## 2024-04-04 DIAGNOSIS — Z79899 Other long term (current) drug therapy: Secondary | ICD-10-CM | POA: Insufficient documentation

## 2024-04-04 LAB — CBC
HCT: 44.8 % (ref 36.0–46.0)
Hemoglobin: 14 g/dL (ref 12.0–15.0)
MCH: 33 pg (ref 26.0–34.0)
MCHC: 31.3 g/dL (ref 30.0–36.0)
MCV: 105.7 fL — ABNORMAL HIGH (ref 80.0–100.0)
Platelets: 391 K/uL (ref 150–400)
RBC: 4.24 MIL/uL (ref 3.87–5.11)
RDW: 15.6 % — ABNORMAL HIGH (ref 11.5–15.5)
WBC: 10 K/uL (ref 4.0–10.5)
nRBC: 0 % (ref 0.0–0.2)

## 2024-04-04 LAB — DIFFERENTIAL
Abs Immature Granulocytes: 0.02 K/uL (ref 0.00–0.07)
Basophils Absolute: 0.1 K/uL (ref 0.0–0.1)
Basophils Relative: 1 %
Eosinophils Absolute: 0.6 K/uL — ABNORMAL HIGH (ref 0.0–0.5)
Eosinophils Relative: 6 %
Immature Granulocytes: 0 %
Lymphocytes Relative: 16 %
Lymphs Abs: 1.6 K/uL (ref 0.7–4.0)
Monocytes Absolute: 1.1 K/uL — ABNORMAL HIGH (ref 0.1–1.0)
Monocytes Relative: 11 %
Neutro Abs: 6.5 K/uL (ref 1.7–7.7)
Neutrophils Relative %: 66 %

## 2024-04-04 LAB — URINALYSIS, ROUTINE W REFLEX MICROSCOPIC
Bacteria, UA: NONE SEEN
Bilirubin Urine: NEGATIVE
Glucose, UA: NEGATIVE
Glucose, UA: NEGATIVE mg/dL
Ketones, UA: NEGATIVE
Ketones, ur: NEGATIVE mg/dL
Nitrite, UA: NEGATIVE
Nitrite: NEGATIVE
Protein, ur: 30 mg/dL — AB
Specific Gravity, UA: 1.025 (ref 1.005–1.030)
Specific Gravity, Urine: 1.025 (ref 1.005–1.030)
Urobilinogen, Ur: 0.2 mg/dL (ref 0.2–1.0)
WBC, UA: >50 WBC/hpf (ref 0–5)
pH, UA: 5.5 (ref 5.0–7.5)
pH: 5 (ref 5.0–8.0)

## 2024-04-04 LAB — URINE DRUG SCREEN
Amphetamines: NEGATIVE
Barbiturates: NEGATIVE
Benzodiazepines: POSITIVE — AB
Cocaine: NEGATIVE
Fentanyl: NEGATIVE
Methadone Scn, Ur: NEGATIVE
Opiates: NEGATIVE
Tetrahydrocannabinol: NEGATIVE

## 2024-04-04 LAB — ETHANOL: Alcohol, Ethyl (B): <15 mg/dL (ref <15)

## 2024-04-04 LAB — COMPREHENSIVE METABOLIC PANEL WITH GFR
ALT: 20 U/L (ref 0–44)
AST: 38 U/L (ref 15–41)
Albumin: 3.8 g/dL (ref 3.5–5.0)
Alkaline Phosphatase: 51 U/L (ref 38–126)
Anion gap: 12 (ref 5–15)
BUN: 15 mg/dL (ref 8–23)
CO2: 22 mmol/L (ref 22–32)
Calcium: 8.9 mg/dL (ref 8.9–10.3)
Chloride: 108 mmol/L (ref 98–111)
Creatinine, Ser: 0.86 mg/dL (ref 0.44–1.00)
GFR, Estimated: >60 mL/min (ref >60)
Glucose, Bld: 97 mg/dL (ref 70–99)
Potassium: 3.9 mmol/L (ref 3.5–5.1)
Sodium: 142 mmol/L (ref 135–145)
Total Bilirubin: 0.4 mg/dL (ref 0.0–1.2)
Total Protein: 8.2 g/dL — ABNORMAL HIGH (ref 6.5–8.1)

## 2024-04-04 LAB — MICROSCOPIC EXAMINATION
Renal Epithel, UA: NONE SEEN /HPF
Yeast, UA: NONE SEEN

## 2024-04-04 LAB — APTT: aPTT: 28 s (ref 24–36)

## 2024-04-04 LAB — PROTIME-INR
INR: 1 (ref 0.8–1.2)
Prothrombin Time: 13.8 s (ref 11.4–15.2)

## 2024-04-04 LAB — CBG MONITORING, ED: Glucose-Capillary: 114 mg/dL — ABNORMAL HIGH (ref 70–99)

## 2024-04-04 MED ORDER — LORAZEPAM 0.5 MG PO TABS
0.2500 mg | ORAL_TABLET | Freq: Once | ORAL | Status: AC
  Administered 2024-04-04: 0.25 mg via ORAL
  Filled 2024-04-04: qty 1

## 2024-04-04 MED ORDER — CEPHALEXIN 500 MG PO CAPS
500.0000 mg | ORAL_CAPSULE | Freq: Once | ORAL | Status: AC
  Administered 2024-04-04: 500 mg via ORAL
  Filled 2024-04-04: qty 1

## 2024-04-04 MED ORDER — CEPHALEXIN 500 MG PO CAPS: 500.0000 mg | ORAL_CAPSULE | Freq: Two times a day (BID) | ORAL | 0 refills | Status: AC

## 2024-04-04 NOTE — ED Notes (Signed)
 Pt was able to move to the bedside commode with assistance from staff

## 2024-04-04 NOTE — Progress Notes (Addendum)
 Subjective: CC: Altered mental status PCP: Jolinda Norene HERO, DO YEP:Gnjw R Abdulla is a 83 y.o. female presenting to clinic today for:  Patient is brought to the office by her daughter who notes that she has been having some intermittent altered mental status.  She has a baseline of moderate dementia.  At first I thought perhaps the Rexulti was causation.  However, she notes over the last 24 hours she is actually noticing some marked facial drooping on the left and now they are concerned that maybe she is sustained a stroke.  Her speech is more slurred than normal.  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   Headache(784.0)    takes Goody powders   Hypertension    takes Metoprolol    PONV (postoperative nausea and vomiting)    UTI (lower urinary tract infection) 08/01/2013    Current Outpatient Medications:    atorvastatin  (LIPITOR) 80 MG tablet, TAKE ONE (1) TABLET BY MOUTH EVERY DAY, Disp: 90 tablet, Rfl: 1   B Complex Vitamins (VITAMIN B COMPLEX PO), Take by mouth., Disp: , Rfl:    busPIRone  (BUSPAR ) 15 MG tablet, Take 1 tablet (15 mg total) by mouth 3 (three) times daily., Disp: 270 tablet, Rfl: 1   cholecalciferol (VITAMIN D3) 25 MCG (1000 UNIT) tablet, Take 1,000 Units by mouth daily., Disp: , Rfl:    denosumab  (PROLIA ) 60 MG/ML SOSY injection, 60mg  Subcutaneous every 6 months, Disp: , Rfl:    ferrous sulfate 325 (65 FE) MG tablet, Take 325 mg by mouth daily with breakfast., Disp: , Rfl:    gabapentin  (NEURONTIN ) 300 MG capsule, Take 1 capsule (300 mg total) by mouth 3 (three)  times daily., Disp: 270 capsule, Rfl: 1   LORazepam  (ATIVAN ) 0.5 MG tablet, Take 0.5-1 tablets (0.25-0.5 mg total) by mouth 2 (two) times daily as needed for anxiety., Disp: 20 tablet, Rfl: 1   Melatonin 3 MG CAPS, Take 1 capsule (3 mg total) by mouth at bedtime as needed (insomnia)., Disp: 30 capsule, Rfl: 0   memantine  (NAMENDA ) 10 MG tablet, Take 1 tablet (10 mg total) by mouth 2 (two) times daily., Disp: 180 tablet, Rfl: 3   metoprolol  succinate (TOPROL -XL) 100 MG 24 hr tablet, Take 1 tablet (100 mg total) by mouth every evening., Disp: 90 tablet, Rfl: 3   nitrofurantoin  (MACRODANTIN ) 50 MG capsule, Take 1 capsule (50 mg total) by mouth at bedtime., Disp: 30 capsule, Rfl: 11   predniSONE  (DELTASONE ) 5 MG tablet, Take 1 tablet (5 mg total) by mouth every morning. Further fills per Dr Mai, Disp: 90 tablet, Rfl: 0   sulfamethoxazole-trimethoprim (BACTRIM DS) 800-160 MG tablet, Take 1 tablet by mouth 2 (two) times daily., Disp: 14 tablet, Rfl: 0   Vibegron  (GEMTESA ) 75 MG TABS, Take 1 tablet (75 mg total) by mouth daily., Disp: 30 tablet, Rfl: 11   brexpiprazole (REXULTI) 2 MG TABS tablet, Take 1 tablet (2 mg total) by mouth daily. Start week #3 (Patient not taking: Reported on 04/04/2024),  Disp: 30 tablet, Rfl: 0   nitrofurantoin , macrocrystal-monohydrate, (MACROBID ) 100 MG capsule, Take 1 capsule (100 mg total) by mouth every 12 (twelve) hours. (Patient not taking: Reported on 04/04/2024), Disp: 14 capsule, Rfl: 0 Social History   Socioeconomic History   Marital status: Married    Spouse name: Not on file   Number of children: Not on file   Years of education: Not on file   Highest education level: Not on file  Occupational History   Not on file  Tobacco Use   Smoking status: Former    Current packs/day: 0.00    Average packs/day: 0.5 packs/day for 8.0 years (4.0 ttl pk-yrs)    Types: Cigarettes    Start date: 03/29/1963    Quit date: 03/29/1971    Years since quitting: 53.0    Smokeless tobacco: Never  Vaping Use   Vaping status: Never Used  Substance and Sexual Activity   Alcohol use: No   Drug use: No   Sexual activity: Never  Other Topics Concern   Not on file  Social History Narrative   Not on file   Social Drivers of Health   Financial Resource Strain: Low Risk  (03/22/2022)   Overall Financial Resource Strain (CARDIA)    Difficulty of Paying Living Expenses: Not hard at all  Food Insecurity: No Food Insecurity (08/05/2023)   Received [facility]   Hunger Vital Sign    Within the past 12 months, you worried that your food would run out before you got the money to buy more.: Never true    Within the past 12 months, the food you bought just didn't last and you didn't have money to get more.: Never true  Transportation Needs: No Transportation Needs (08/05/2023)   Received from [facility]   PRAPARE - Transportation    Lack of Transportation (Medical): No    Lack of Transportation (Non-Medical): No  Physical Activity: Inactive (03/22/2022)   Exercise Vital Sign    Days of Exercise per Week: 0 days    Minutes of Exercise per Session: 0 min  Stress: No Stress Concern Present (03/22/2022)   [employer] of Occupational Health - Occupational Stress Questionnaire    Feeling of Stress : Not at all  Social Connections: Moderately Integrated (03/22/2022)   Social Connection and Isolation Panel    Frequency of Communication with Friends and Family: More than three times a week    Frequency of Social Gatherings with Friends and Family: More than three times a week    Attends Religious Services: More than 4 times per year    Active Member of [employer] or Organizations: No    Attends Banker Meetings: Never    Marital Status: Married  Catering Manager Violence: Not At Risk (03/22/2022)   Humiliation, Afraid, Rape, and Kick questionnaire    Fear of Current or Ex-Partner: No    Emotionally Abused: No    Physically Abused: No     Sexually Abused: No   Family History  Problem Relation Age of Onset   CAD Father    Depression Father    Stroke Other    Drug abuse Mother        prescription drug abuse   Breast cancer Neg Hx     Objective: Office vital signs reviewed. BP 118/74   Pulse 80   Temp 97.7 F (36.5 C)   Ht 5' 3 (1.6 m)   SpO2 95%   BMI 24.80 kg/m  Physical Examination:  General: Awake, alert, nontoxic elderly female, somewhat irritable and confused HEENT: Ptosis of the left lid and flattening of the nasolabial fold on the left. Neuro: Disoriented at baseline.  Slightly slurred speech.  Assessment/ Plan: 83 y.o. female   Stroke-like symptoms  Facial droop  Disorientation - Plan: Urinalysis, Routine w reflex microscopic, Urine Culture  Moderate late onset Alzheimer's dementia with anxiety (HCC)  Grief reaction - Plan: LORazepam  (ATIVAN ) 0.5 MG tablet  Generalized anxiety disorder - Plan: LORazepam  (ATIVAN ) 0.5 MG tablet   Due to initial concern for disorientation urinalysis was performed and did demonstrate few bacteria with 11-30 white blood cells.  Nitrite negative.  However, after further evaluation I am concerned that she has had an acute stroke.  Her daughter has requested that she be transported by EMS as patient has significant mobility issues.  Patient to be transported via emergency services to nearest ER for further evaluation of suspected acute stroke  **addendum risperdone started and ativan  renewed.  The Narcotic Database has been reviewed.  There were no red flags.  Follow up in 1 mth    Paislee Szatkowski M Syenna Nazir, DO [facility] [phone]

## 2024-04-04 NOTE — ED Provider Notes (Signed)
 [place] EMERGENCY DEPARTMENT AT [facility] Provider Note   CSN: [id number] Arrival date & time: 04/04/24  1558     Patient presents with: Altered Mental Status and Weakness   Eileen Jennings is a 83 y.o. female who presents to the emergency department with a chief complaint of altered mental status and weakness.  Patient was seen by primary care provider earlier today and referred to the emergency department for stroke workup.  Per chart review primary care provider states that patient has had increasing left-sided facial droop over the past 24 hours and increasing slurred speech. On my initial assessment no family at bedside, patient is awake and alert, slight left facial droop noticed with ptosis of left eyelid, no obvious slurred speech at time of my exam.  No pronator drift, no obvious motor deficit.  Patient denies visual disturbances.  Difficult history to obtain due to patient baseline mental status with history of dementia.  Per chart review past medical history significant for polymyalgia rheumatica, hypertension, anemia, fibromyalgia, history of CVA, anxiety, moderate dementia, depression, etc.  On reassessment daughter [name] arrived at bedside.  States that patient has had worsened altered mental status over the past few days and increased confusion.  Also states that patient has been complaining of left leg pain.  Denies history of blood clot.  Denies calf redness, swelling, or tenderness.  Denies the patient having fever, chills, chest pain, or shortness of breath.  Denies falls or trauma/injury.  Daughter states that patient still lives in her home however has 24-hour care.    Altered Mental Status Associated symptoms: weakness   Weakness      Prior to Admission medications   Medication Sig Start Date End Date Taking? Authorizing Provider  acetaminophen  (TYLENOL ) 500 MG tablet Take 500 mg by mouth in the morning, at noon, and at bedtime.   Yes [provider]  aspirin EC 81 MG tablet Take 81 mg by mouth every 6 (six) hours as needed for mild pain (pain score 1-3). Swallow whole.   Yes [provider]  Aspirin-Acetaminophen -Caffeine (GOODY HEADACHE PO) Take 1 Package by mouth daily as needed (headaches).   Yes [provider]  atorvastatin  (LIPITOR) 80 MG tablet TAKE ONE (1) TABLET BY MOUTH EVERY DAY Patient taking differently: Take 80 mg by mouth daily. 01/26/24  Yes Jolinda Potter M, DO  B Complex Vitamins (VITAMIN B COMPLEX PO) Take 1 tablet by mouth daily.   Yes [provider]  busPIRone  (BUSPAR ) 15 MG tablet Take 1 tablet (15 mg total) by mouth 3 (three) times daily. 01/02/24  Yes Gottschalk, Potter M, DO  cephALEXin  (KEFLEX ) 500 MG capsule Take 1 capsule (500 mg total) by mouth 2 (two) times daily for 7 days. 04/04/24 04/11/24 Yes Carline Dura F, PA-C  cholecalciferol (VITAMIN D3) 25 MCG (1000 UNIT) tablet Take 1,000 Units by mouth daily.   Yes [provider]  denosumab  (PROLIA ) 60 MG/ML SOSY injection Inject 60 mg into the skin every 6 (six) months.   Yes [provider]  ferrous sulfate 325 (65 FE) MG tablet Take 325 mg by mouth at bedtime.   Yes [provider]  gabapentin  (NEURONTIN ) 300 MG capsule Take 1 capsule (300 mg total) by mouth 3 (three) times daily. 01/02/24  Yes Gottschalk, Ashly M, DO  guaiFENesin-dextromethorphan (ROBITUSSIN DM) 100-10 MG/5ML syrup Take 5 mLs by mouth every 4 (four) hours as needed for cough.   Yes [provider]  LORazepam  (ATIVAN ) 0.5  MG tablet Take 0.5-1 tablets (0.25-0.5 mg total) by mouth 2 (two) times daily as needed for anxiety. 01/02/24  Yes Gottschalk, Norene M, DO  Melatonin 10 MG TABS Take 1 tablet by mouth at bedtime.   Yes [provider]  memantine  (NAMENDA ) 10 MG tablet Take 1 tablet (10 mg total) by mouth 2 (two) times daily. 01/02/24  Yes Jolinda Norene M, DO  metoprolol  succinate (TOPROL -XL) 100 MG 24 hr tablet Take 1  tablet (100 mg total) by mouth every evening. 01/02/24  Yes Jolinda Norene M, DO  nitrofurantoin  (MACRODANTIN ) 50 MG capsule Take 1 capsule (50 mg total) by mouth at bedtime. 02/01/24  Yes McKenzie, Belvie CROME, MD  predniSONE  (DELTASONE ) 5 MG tablet Take 1 tablet (5 mg total) by mouth every morning. Further fills per Dr Mai 11/03/23  Yes Gottschalk, Norene HERO, DO  Vibegron  (GEMTESA ) 75 MG TABS Take 1 tablet (75 mg total) by mouth daily. 03/01/24  Yes McKenzie, Belvie CROME, MD    Allergies: Donepezil hcl and Hydrocodone-acetaminophen     Review of Systems  Neurological:  Positive for weakness.    Updated Vital Signs BP (!) 125/91   Pulse 91   Temp 98 F (36.7 C) (Oral)   Resp 17   Ht 5' 3 (1.6 m)   SpO2 96%   BMI 24.80 kg/m   Physical Exam Vitals and nursing note reviewed.  Constitutional:      General: She is awake. She is not in acute distress.    Appearance: Normal appearance. She is not ill-appearing, toxic-appearing or diaphoretic.  HENT:     Head: Normocephalic and atraumatic.     Comments: Mild left sided facial droop present, ptosis of left eyelid Eyes:     General: No scleral icterus.    Extraocular Movements: Extraocular movements intact.     Pupils: Pupils are equal, round, and reactive to light.  Cardiovascular:     Rate and Rhythm: Normal rate and regular rhythm.  Pulmonary:     Effort: Pulmonary effort is normal. No respiratory distress.  Musculoskeletal:     Comments: Grossly normal ROM of all 4 extremities, no strength deficit noted of bilateral upper or lower extremities, patient able to lift bilateral upper and lower extremities against gravity as well as resistance  Skin:    General: Skin is warm.     Capillary Refill: Capillary refill takes less than 2 seconds.  Neurological:     Mental Status: She is alert. Mental status is at baseline.     Comments: Normal finger-to-nose test, patient followed commands throughout examination, ptosis of left eyelid  however no other severe facial droop, no obvious slurred speech  Patient did repeatedly ask why she is here in the ED however has a hx of dementia per chart  Psychiatric:        Behavior: Behavior is cooperative.     (all labs ordered are listed, but only abnormal results are displayed) Labs Reviewed  CBC - Abnormal; Notable for the following components:      Result Value   MCV 105.7 (*)    RDW 15.6 (*)    All other components within normal limits  DIFFERENTIAL - Abnormal; Notable for the following components:   Monocytes Absolute 1.1 (*)    Eosinophils Absolute 0.6 (*)    All other components within normal limits  COMPREHENSIVE METABOLIC PANEL WITH GFR - Abnormal; Notable for the following components:   Total Protein 8.2 (*)    All other components within normal  limits  URINE DRUG SCREEN - Abnormal; Notable for the following components:   Benzodiazepines POSITIVE (*)    All other components within normal limits  URINALYSIS, ROUTINE W REFLEX MICROSCOPIC - Abnormal; Notable for the following components:   Color, Urine AMBER (*)    APPearance HAZY (*)    Hgb urine dipstick MODERATE (*)    Protein, ur 30 (*)    Leukocytes,Ua MODERATE (*)    All other components within normal limits  CBG MONITORING, ED - Abnormal; Notable for the following components:   Glucose-Capillary 114 (*)    All other components within normal limits  PROTIME-INR  APTT  ETHANOL    EKG: EKG Interpretation Date/Time:  Wednesday April 04 2024 18:00:00 EST Ventricular Rate:  98 PR Interval:  64 QRS Duration:  120 QT Interval:  393 QTC Calculation: 436 R Axis:   -40  Text Interpretation: Sinus rhythm Artifact Non-specific ST-t changes Low voltage QRS Confirmed by Bernard Drivers ([id number]) on 04/04/2024 6:18:18 PM  Radiology: MR BRAIN WO CONTRAST Result Date: 04/04/2024 EXAM: MRI BRAIN WITHOUT CONTRAST 04/04/2024 06:06:51 PM TECHNIQUE: Multiplanar multisequence MRI of the head/brain was performed  without the administration of intravenous contrast. COMPARISON: Same day CT head. CLINICAL HISTORY: left sided facial droop, left eyelid ptosis. FINDINGS: LIMITATIONS/ARTIFACTS: Multiple sequences are degraded by motion artifact. BRAIN AND VENTRICLES: Redemonstrated remote right PCA territory infarct. Generalized parenchymal volume loss. Moderate chronic microvascular ischemic changes. There is slight ex vacuo dilatation of the right lateral ventricle. Prominent arachnoid granulation involving the right occipital calvarium. Small remote infarcts in the cerebellum. No acute infarct. No intracranial hemorrhage. No mass. No midline shift. No hydrocephalus. The sella is unremarkable. Normal flow voids. ORBITS: No acute abnormality. SINUSES AND MASTOIDS: Right mastoid and middle ear effusion again noted. BONES AND SOFT TISSUES: Normal marrow signal. No acute soft tissue abnormality. IMPRESSION: 1. No acute intracranial abnormality. 2. Remote right PCA territory infarct. 3. Small remote cerebellar infarcts. 4. Moderate chronic microvascular ischemic changes. 5. Generalized parenchymal volume loss. 6. Right mastoid and middle ear effusion. Electronically signed by: Donnice Mania MD 04/04/2024 06:25 PM EST RP Workstation: [id number]   CT HEAD WO CONTRAST Result Date: 04/04/2024 EXAM: CT HEAD WITHOUT CONTRAST 04/04/2024 05:32:32 PM TECHNIQUE: CT of the head was performed without the administration of intravenous contrast. Automated exposure control, iterative reconstruction, and/or weight based adjustment of the mA/kV was utilized to reduce the radiation dose to as low as reasonably achievable. COMPARISON: 05/16/2023 CLINICAL HISTORY: Neuro deficit, acute, stroke suspected FINDINGS: BRAIN AND VENTRICLES: No acute hemorrhage. No evidence of acute infarct. Encephalomalacia within right occipital and posterior temporal lobes compatible with old infarct. Nonspecific periventricular and subcortical white matter  hypoattenuation, likely chronic microvascular ischemic changes. Prominence of ventricles and sulci reflects parenchymal volume loss. No extra-axial collection. No mass effect or midline shift. Atherosclerotic calcifications of carotid siphons. ORBITS: No acute abnormality. SINUSES: Right mastoid and middle ear effusion. SOFT TISSUES AND SKULL: No acute soft tissue abnormality. No skull fracture. IMPRESSION: 1. No acute intracranial abnormality. 2. Remote right PCA territory infarct. 3. Chronic microvascular ischemic changes. 4. Parenchymal volume loss. 5. Right mastoid and middle ear effusion, similar to prior. Electronically signed by: Donnice Mania MD 04/04/2024 06:06 PM EST RP Workstation: [id number]   DG Tibia/Fibula Left Result Date: 04/04/2024 EXAM: 2 VIEW(S) XRAY OF THE LEFT TIBIA AND FIBULA 04/04/2024 05:14:00 PM COMPARISON: None available. CLINICAL HISTORY: left leg pain FINDINGS: BONES AND JOINTS: Partially visualized left total knee arthroplasty in place. Cortical thickening of  the mid tibial diaphysis, likely due to remote trauma such as subperiosteal hematoma. . Chronic cortical thickening. No acute fracture. No focal osseous lesion. No joint dislocation. SOFT TISSUES: The soft tissues are unremarkable. IMPRESSION: 1. Chronic cortical thickening of the mid tibial diaphysis. 2. No acute bony findings. Electronically signed by: Maude Stammer MD 04/04/2024 06:04 PM EST RP Workstation: [id number]     Procedures   Medications Ordered in the ED  LORazepam  (ATIVAN ) tablet 0.25 mg (0.25 mg Oral Given 04/04/24 1944)  cephALEXin  (KEFLEX ) capsule 500 mg (500 mg Oral Given 04/04/24 2242)                                    Medical Decision Making Amount and/or Complexity of Data Reviewed Labs: ordered. Radiology: ordered.  Risk Prescription drug management.   Patient presents to the ED for concern of worsening altered mental status, facial droop, this involves an extensive number of  treatment options, and is a complaint that carries with it a high risk of complications and morbidity.  The differential diagnosis includes stroke, TIA, Bell's palsy, sepsis, UTI, etc.   Co morbidities that complicate the patient evaluation  polymyalgia rheumatica, hypertension, anemia, fibromyalgia, history of CVA, anxiety, moderate dementia, depression   Additional history obtained:  Patient with history obtained from primary care provider note as well as daughter   Lab Tests:  I Ordered, and personally interpreted labs.  The pertinent results include: CBC reassuring, CMP reassuring, blood glucose 114, urinalysis possibly consistent with infection with moderate hemoglobin, moderate leukocytes, greater than 50 WBCs however no bacteria, UDS positive for benzodiazepines which patient is described, ethanol less than 15   Imaging Studies ordered:  I ordered imaging studies including MRI brain, CT head, x-ray of left hip/fib I independently visualized and interpreted imaging which showed: X-ray tip/fib: Chronic cortical thickening, no acute abnormality CT head: No acute intracranial abnormality, remote right PCA territory infarct, chronic microvascular ischemic changes, parenchymal volume loss, right mastoid and middle ear effusion, similar to prior MRI brain without contrast: No acute intracranial normality, remote right PCA territory infarct, small remote cerebellar infarcts, moderate chronic microvascular ischemic changes, generalized parenchymal volume loss, right mastoid and middle ear effusion I agree with the radiologist interpretation   Medicines ordered and prescription drug management:  I ordered medication including Ativan , Keflex  for agitation and possible UTI Reevaluation of the patient after these medicines showed that the patient improved I have reviewed the patients home medicines and have made adjustments as needed   Test Considered:  DVT ultrasound of left lower  extremity: Declined at this time as patient and daughter deny calf redness, swelling, tenderness, no history of blood clot, no calf tenderness with palpation, left calf does not appear swollen   Critical Interventions:  None   Problem List / ED Course:  83 year old female, vital signs stable, presents to the emergency department for worsening altered mental status as well as left-sided facial droop versus left eyelid ptosis On physical exam left eyelid ptosis apparent however patient has no focal neurodeficit on my exam, patient also complaining of left leg pain however no obvious tenderness to palpation, no overlying skin lesions, rashes, or obvious injury Clinically I did not think patient met criteria for code stroke at this time with no focal neurodeficit, lab work reassuring, urinalysis possibly consistent with UTI so first dose of antibiotics given here and antibiotics prescribed outpatient, imaging overall reassuring of head  and brain, no acute abnormalities noted however history of stroke is apparent X-rays of left lower extremity reassuring as well Extensive discussion completed with daughter as well as patient regarding results today At this time I do not think that patient would benefit from admission to the hospital, at this time I feel patient is stable for outpatient discharge and treatment of possible UTI, reviewed previous urine cultures which grew E. coli and were sensitive to cephalosporins, prescribed patient short course of Keflex  Extensive return precautions given Patient discharged Most likely diagnosis for worsening confusion at this time is urinary tract infection, no evidence of acute stroke   Reevaluation:  After the interventions noted above, I reevaluated the patient and found that they have :stayed the same   Social Determinants of Health:  none   Dispostion:  After consideration of the diagnostic results and the patients response to treatment, I feel  that the patent would benefit from discharge and outpatient therapy as described, continued monitoring of symptoms, completion of antibiotic course, follow-up with primary care provider..       Final diagnoses:  Altered mental status, unspecified altered mental status type  Weakness  Left leg pain  Moderate dementia, unspecified dementia type, unspecified whether behavioral, psychotic, or mood disturbance or anxiety (HCC)  Acute cystitis without hematuria    ED Discharge Orders          Ordered    cephALEXin  (KEFLEX ) 500 MG capsule  2 times daily        04/04/24 2244               Mamye Bolds F, PA-C 04/05/24 0113    Bernard Drivers, MD 04/05/24 1537

## 2024-04-04 NOTE — ED Notes (Signed)
 Pt passed swallow screen however, she eats/drinks very slowly to prevent choking

## 2024-04-04 NOTE — Discharge Instructions (Addendum)
 It was a pleasure taking care of you today.  Based on your history, physical exam, labs, and imaging I feel you are safe for discharge.  Today the imaging completed of your head was reassuring.  There was no acute evidence of stroke.  However your urine sample could possibly indicate a UTI.  Because of this you have been prescribed an antibiotic called Keflex , please pick it up from the pharmacy and take as prescribed and complete the entire course.  Please make your primary care provider aware of your workup and all findings today including labs as well as imaging.  I do recommend a follow-up with your primary care doctor within the next 2 to 3 days if possible for a recheck.  If altered mental status worsens or if you develop any of the following symptoms including but not limited to fever, chills, chest pain, shortness of breath, worsening left leg pain, left leg swelling, redness, worsening weakness, or other concerning symptom recommend return to the emergency department for further evaluation.  Please remain compliant with your other outpatient medications.

## 2024-04-04 NOTE — ED Triage Notes (Signed)
 Patient BIB [facility] from [facility]. EMS called out for stroke symptom that happened 2 days ago. EMS stroke screened her and negative. Hx of Dementia, more confusion noted from baseline and Family report decrease in mobility and increased weakness. EMS vitals 130/70, pulse 88, Oxygen 97% room air, CBG 114.

## 2024-04-05 ENCOUNTER — Ambulatory Visit: Payer: Self-pay | Admitting: Family Medicine

## 2024-04-06 ENCOUNTER — Encounter: Payer: Self-pay | Admitting: Family Medicine

## 2024-04-06 DIAGNOSIS — G301 Alzheimer's disease with late onset: Secondary | ICD-10-CM

## 2024-04-06 LAB — URINE CULTURE

## 2024-04-09 MED ORDER — RISPERIDONE 0.25 MG PO TBDP: 0.2500 mg | ORAL_TABLET | Freq: Two times a day (BID) | ORAL | 0 refills | Status: DC

## 2024-04-10 ENCOUNTER — Other Ambulatory Visit: Payer: Self-pay | Admitting: Family Medicine

## 2024-04-10 ENCOUNTER — Encounter: Payer: Self-pay | Admitting: Family Medicine

## 2024-04-10 DIAGNOSIS — F4320 Adjustment disorder, unspecified: Secondary | ICD-10-CM

## 2024-04-10 DIAGNOSIS — F411 Generalized anxiety disorder: Secondary | ICD-10-CM

## 2024-04-10 MED ORDER — LORAZEPAM 0.5 MG PO TABS: 0.2500 mg | ORAL_TABLET | Freq: Two times a day (BID) | ORAL | 1 refills | Status: DC | PRN

## 2024-04-10 NOTE — Addendum Note (Signed)
 Addended by: JOLINDA NORENE HERO on: 04/10/2024 12:05 PM   Modules accepted: Orders

## 2024-04-23 ENCOUNTER — Other Ambulatory Visit: Payer: Self-pay | Admitting: Family Medicine

## 2024-04-23 DIAGNOSIS — F411 Generalized anxiety disorder: Secondary | ICD-10-CM

## 2024-04-23 DIAGNOSIS — F4320 Adjustment disorder, unspecified: Secondary | ICD-10-CM

## 2024-04-23 DIAGNOSIS — Z8673 Personal history of transient ischemic attack (TIA), and cerebral infarction without residual deficits: Secondary | ICD-10-CM

## 2024-04-30 ENCOUNTER — Encounter: Payer: Self-pay | Admitting: Family Medicine

## 2024-04-30 ENCOUNTER — Other Ambulatory Visit: Payer: Self-pay | Admitting: Family Medicine

## 2024-04-30 DIAGNOSIS — F411 Generalized anxiety disorder: Secondary | ICD-10-CM

## 2024-04-30 DIAGNOSIS — F4320 Adjustment disorder, unspecified: Secondary | ICD-10-CM

## 2024-05-04 ENCOUNTER — Other Ambulatory Visit: Payer: Self-pay | Admitting: Family Medicine

## 2024-05-04 DIAGNOSIS — F4320 Adjustment disorder, unspecified: Secondary | ICD-10-CM

## 2024-05-04 DIAGNOSIS — F411 Generalized anxiety disorder: Secondary | ICD-10-CM

## 2024-05-04 MED ORDER — LORAZEPAM 0.5 MG PO TABS: 0.2500 mg | ORAL_TABLET | Freq: Two times a day (BID) | ORAL | 1 refills | Status: DC | PRN

## 2024-05-09 ENCOUNTER — Other Ambulatory Visit: Payer: Self-pay | Admitting: Family Medicine

## 2024-05-09 ENCOUNTER — Encounter: Admitting: Physician Assistant

## 2024-05-09 ENCOUNTER — Encounter: Payer: Self-pay | Admitting: Family Medicine

## 2024-05-09 ENCOUNTER — Other Ambulatory Visit

## 2024-05-09 DIAGNOSIS — N3000 Acute cystitis without hematuria: Secondary | ICD-10-CM | POA: Diagnosis not present

## 2024-05-09 DIAGNOSIS — F02B4 Dementia in other diseases classified elsewhere, moderate, with anxiety: Secondary | ICD-10-CM

## 2024-05-09 LAB — MICROSCOPIC EXAMINATION
Epithelial Cells (non renal): NONE SEEN /HPF (ref 0–10)
Renal Epithel, UA: NONE SEEN /HPF
WBC, UA: >30 /HPF — AB (ref 0–5)

## 2024-05-09 LAB — URINALYSIS, ROUTINE W REFLEX MICROSCOPIC
Bilirubin, UA: NEGATIVE
Glucose, UA: NEGATIVE
Ketones, UA: NEGATIVE
Nitrite, UA: NEGATIVE
Specific Gravity, UA: 1.02 (ref 1.005–1.030)
Urobilinogen, Ur: 0.2 mg/dL (ref 0.2–1.0)
pH, UA: 5.5 (ref 5.0–7.5)

## 2024-05-09 MED ORDER — RISPERIDONE 0.5 MG PO TABS: 0.5000 mg | ORAL_TABLET | Freq: Every day | ORAL | 0 refills | Status: DC

## 2024-05-09 MED ORDER — CEFDINIR 300 MG PO CAPS: 300.0000 mg | ORAL_CAPSULE | Freq: Two times a day (BID) | ORAL | 0 refills | Status: AC

## 2024-05-09 NOTE — Telephone Encounter (Signed)
 This encounter was created in error - please disregard.

## 2024-05-09 NOTE — Telephone Encounter (Signed)
 Please see the MyChart message reply(ies) for my assessment and plan.    This patient gave consent for this Medical Advice Message and is aware that it may result in a bill to [employer], as well as the possibility of receiving a bill for a co-payment or deductible. They are an established patient, but are not seeking medical advice exclusively about a problem treated during an in person or video visit in the last seven days. I did not recommend an in person or video visit within seven days of my reply.    I spent a total of 7 minutes cumulative time within 7 days through [employer].  Norene Fielding, DO

## 2024-05-09 NOTE — Progress Notes (Signed)
 Duplicate. Treated by PCP.   I have spent 5 minutes in review of e-visit questionnaire, review and updating patient chart, medical decision making and response to patient.   Delon CHRISTELLA Dickinson, PA-C

## 2024-05-11 ENCOUNTER — Ambulatory Visit: Admitting: Urology

## 2024-05-12 LAB — URINE CULTURE

## 2024-05-14 ENCOUNTER — Ambulatory Visit: Payer: Self-pay | Admitting: Family Medicine

## 2024-05-29 ENCOUNTER — Encounter: Payer: Self-pay | Admitting: Family Medicine

## 2024-05-29 ENCOUNTER — Telehealth: Payer: Self-pay

## 2024-05-29 NOTE — Telephone Encounter (Signed)
 Reached out to [facility] to see if the last 2 refills for Ativan  from 05/04/2024 were already picked up and patient was out of medication. [name] stated that she is unsure if there were any refills left and said that she knows it is a only if absolutely needed medication but most days are very hard for the patient and that it is hard to get her in the car. I told [name] that Dr. Jolinda very clearly told her that she does not need to rely on the Ativan  and if they are having to use it multiple times daily patient needs to be reevaluated. [name] says that she is pretty sure they have discontinued the Risperidone  and the Ativan  is the only thing that seems to work. [name] stated that she is driving and is getting confused, she would rather talk to Dr. Jolinda directly tomorrow at her appointment. Reached out to pharmacy, pharmacist stated that the Ativan  was last filled on 05/04/2024 and has 1 remaining refill.

## 2024-05-30 NOTE — Telephone Encounter (Signed)
 Noted

## 2024-06-06 ENCOUNTER — Telehealth: Payer: Self-pay

## 2024-06-06 ENCOUNTER — Other Ambulatory Visit: Payer: Self-pay

## 2024-06-06 ENCOUNTER — Encounter: Payer: Self-pay | Admitting: Nurse Practitioner

## 2024-06-06 ENCOUNTER — Telehealth (INDEPENDENT_AMBULATORY_CARE_PROVIDER_SITE_OTHER): Admitting: Nurse Practitioner

## 2024-06-06 ENCOUNTER — Encounter: Payer: Self-pay | Admitting: Family Medicine

## 2024-06-06 DIAGNOSIS — R300 Dysuria: Secondary | ICD-10-CM

## 2024-06-06 DIAGNOSIS — N3001 Acute cystitis with hematuria: Secondary | ICD-10-CM | POA: Diagnosis not present

## 2024-06-06 LAB — URINALYSIS, ROUTINE W REFLEX MICROSCOPIC
Bilirubin, UA: NEGATIVE
Glucose, UA: NEGATIVE
Ketones, UA: NEGATIVE
Nitrite, UA: POSITIVE — AB
Urobilinogen, Ur: 0.2 mg/dL (ref 0.2–1.0)
pH, UA: 6 (ref 5.0–7.5)

## 2024-06-06 LAB — MICROSCOPIC EXAMINATION
RBC, Urine: >30 /HPF — AB (ref 0–2)
Renal Epithel, UA: NONE SEEN /HPF
WBC, UA: >30 /HPF — AB (ref 0–5)
Yeast, UA: NONE SEEN

## 2024-06-06 MED ORDER — SULFAMETHOXAZOLE-TRIMETHOPRIM 800-160 MG PO TABS: 1.0000 | ORAL_TABLET | Freq: Two times a day (BID) | ORAL | 0 refills | Status: DC

## 2024-06-06 NOTE — Progress Notes (Signed)
 "  Virtual Visit via video Note Due to COVID-19 pandemic this visit was conducted virtually. This visit type was conducted due to national recommendations for restrictions regarding the COVID-19 Pandemic (e.g. social distancing, sheltering in place) in an effort to limit this patient's exposure and mitigate transmission in our community. All issues noted in this document were discussed and addressed.  A physical exam was not performed with this format.   I connected with Eileen Jennings on 06/06/2024 at 1150 by name and DOB and verified that I am speaking with the correct person using two identifiers. Eileen Jennings is currently located at home and Eileen Jennings the caretaker is currently with them during visit. The provider, Nena Deitra Morton Sebastian, NP is located in their office at time of visit.  I discussed the limitations, risks, security and privacy concerns of performing an evaluation and management service by virtual visit and the availability of in person appointments. I also discussed with the patient that there may be a patient responsible charge related to this service. The patient expressed understanding and agreed to proceed.  Subjective:  Patient ID: Eileen Jennings, female    DOB: 1941-05-01, 84 y.o.   MRN: [id number]  Chief Complaint:  Dysuria   HPI: Eileen Jennings is a 84 y.o. female presenting on 06/06/2024 for Dysuria  84 year old female seen via telehealth for evaluation of urinary symptoms. A urine sample was brought to the clinic prior to the visit. The patient reports foul-smelling urine, increased urinary frequency, and new-onset confusion for the past 3 days. She denies flank pain, fever, chills, nausea, or vomiting. Symptoms are consistent with a suspected urinary tract infection.   Relevant past medical, surgical, family, and social history reviewed and updated as indicated.  Allergies and medications reviewed and updated.   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 ago   COLONOSCOPY WITH PROPOFOL  N/A 01/30/2013   Procedure: COLONOSCOPY WITH PROPOFOL ;  Surgeon: Gladis MARLA Louder, MD;  Location: [facility];  Service: Endoscopy;  Laterality: N/A;   CYSTOCELE REPAIR  age 89 with rectocele   ESOPHAGOGASTRODUODENOSCOPY (EGD) WITH PROPOFOL  N/A 01/30/2013   Procedure: ESOPHAGOGASTRODUODENOSCOPY (EGD) WITH PROPOFOL ;  Surgeon: Gladis MARLA Louder, MD;  Location: [facility];  Service: Endoscopy;  Laterality: N/A;   INCONTINENCE SURGERY  4 yrs. 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/2011   Procedure: TOTAL KNEE ARTHROPLASTY;  Surgeon: Dempsey GAILS Aluisio;  Location: [facility];  Service: Orthopedics;  Laterality: Left;    Social History   Socioeconomic History   Marital status: Married    Spouse name: Not on file   Number of children: Not on file   Years of education: Not on file   Highest education level: Not on file  Occupational History   Not  on file  Tobacco Use   Smoking status: Former    Current packs/day: 0.00    Average packs/day: 0.5 packs/day for 8.0 years (4.0 ttl pk-yrs)    Types: Cigarettes    Start date: 03/29/1963    Quit date: 03/29/1971    Years since quitting: 53.2   Smokeless tobacco: Never  Vaping Use   Vaping status: Never Used  Substance and Sexual Activity   Alcohol use: No   Drug use: No   Sexual activity: Never  Other Topics Concern   Not on file  Social History Narrative   Not on file    Social Drivers of Health   Tobacco Use: Medium Risk (06/06/2024)   Patient History    Smoking Tobacco Use: Former    Smokeless Tobacco Use: Never    Passive Exposure: Not on Actuary Strain: Low Risk (03/22/2022)   Overall Financial Resource Strain (CARDIA)    Difficulty of Paying Living Expenses: Not hard at all  Food Insecurity: No Food Insecurity (08/05/2023)   Received from [facility]   Epic    Within the past 12 months, you worried that your food would run out before you got the money to buy more.: Never true    Within the past 12 months, the food you bought just didn't last and you didn't have money to get more.: Never true  Transportation Needs: No Transportation Needs (08/05/2023)   Received from [facility]   PRAPARE - Transportation    Lack of Transportation (Medical): No    Lack of Transportation (Non-Medical): No  Physical Activity: Inactive (03/22/2022)   Exercise Vital Sign    Days of Exercise per Week: 0 days    Minutes of Exercise per Session: 0 min  Stress: No Stress Concern Present (03/22/2022)   [employer] of Occupational Health - Occupational Stress Questionnaire    Feeling of Stress : Not at all  Social Connections: Moderately Integrated (03/22/2022)   Social Connection and Isolation Panel    Frequency of Communication with Friends and Family: More than three times a week    Frequency of Social Gatherings with Friends and Family: More than three times a week    Attends Religious Services: More than 4 times per year    Active Member of [employer] or Organizations: No    Attends Banker Meetings: Never    Marital Status: Married  Catering Manager Violence: Not At Risk (03/22/2022)   Humiliation, Afraid, Rape, and Kick questionnaire    Fear of Current or Ex-Partner: No    Emotionally Abused: No    Physically Abused: No    Sexually Abused: No  Depression (PHQ2-9): High Risk (03/02/2024)   Depression (PHQ2-9)    PHQ-2  Score: 19  Alcohol Screen: Low Risk (03/22/2022)   Alcohol Screen    Last Alcohol Screening Score (AUDIT): 0  Housing: Low Risk (03/22/2022)   Housing    Last Housing Risk Score: 0  Utilities: Not At Risk (03/22/2022)   AHC Utilities    Threatened with loss of utilities: No  Health Literacy: High Risk (08/05/2023)   Received from [facility]    How often do you need to have someone help you when you read instructions, pamphlets, or other written material from your doctor or pharmacy?: Often    Outpatient Encounter Medications as of 06/06/2024  Medication Sig   sulfamethoxazole -trimethoprim  (BACTRIM  DS) 800-160 MG tablet Take 1 tablet by mouth 2 (  two) times daily for 7 days.   acetaminophen  (TYLENOL ) 500 MG tablet Take 500 mg by mouth in the morning, at noon, and at bedtime.   aspirin EC 81 MG tablet Take 81 mg by mouth every 6 (six) hours as needed for mild pain (pain score 1-3). Swallow whole.   Aspirin-Acetaminophen -Caffeine (GOODY HEADACHE PO) Take 1 Package by mouth daily as needed (headaches).   atorvastatin  (LIPITOR) 80 MG tablet TAKE ONE (1) TABLET BY MOUTH EVERY DAY (Patient taking differently: Take 80 mg by mouth daily.)   B Complex Vitamins (VITAMIN B COMPLEX PO) Take 1 tablet by mouth daily.   busPIRone  (BUSPAR ) 15 MG tablet Take 1 tablet (15 mg total) by mouth 3 (three) times daily.   cholecalciferol (VITAMIN D3) 25 MCG (1000 UNIT) tablet Take 1,000 Units by mouth daily.   denosumab  (PROLIA ) 60 MG/ML SOSY injection Inject 60 mg into the skin every 6 (six) months.   ferrous sulfate 325 (65 FE) MG tablet Take 325 mg by mouth at bedtime.   gabapentin  (NEURONTIN ) 300 MG capsule Take 1 capsule (300 mg total) by mouth 3 (three) times daily.   guaiFENesin-dextromethorphan (ROBITUSSIN DM) 100-10 MG/5ML syrup Take 5 mLs by mouth every 4 (four) hours as needed for cough.   LORazepam  (ATIVAN ) 0.5 MG tablet Take 0.5-1 tablets (0.25-0.5 mg total) by mouth 2 (two)  times daily as needed for anxiety (USE ONLY IF ABSOLUTELY NEEDED).   Melatonin 10 MG TABS Take 1 tablet by mouth at bedtime.   memantine  (NAMENDA ) 10 MG tablet Take 1 tablet (10 mg total) by mouth 2 (two) times daily.   metoprolol  succinate (TOPROL -XL) 100 MG 24 hr tablet Take 1 tablet (100 mg total) by mouth every evening.   nitrofurantoin  (MACRODANTIN ) 50 MG capsule Take 1 capsule (50 mg total) by mouth at bedtime.   predniSONE  (DELTASONE ) 5 MG tablet Take 1 tablet (5 mg total) by mouth every morning. Further fills per Dr Mai   risperiDONE  (RISPERDAL ) 0.5 MG tablet Take 1 tablet (0.5 mg total) by mouth at bedtime.   Vibegron  (GEMTESA ) 75 MG TABS Take 1 tablet (75 mg total) by mouth daily.   No facility-administered encounter medications on file as of 06/06/2024.    Allergies[1]  Review of Systems  Constitutional:  Negative for chills and diaphoresis.  Respiratory:  Negative for chest tightness and shortness of breath.   Cardiovascular:  Negative for chest pain and palpitations.  Genitourinary:  Positive for dysuria and urgency.       Fould smelling urine  Neurological:  Negative for dizziness and headaches.  Psychiatric/Behavioral:  Positive for confusion.      Observations/Objective: No vital signs or physical exam, this was a virtual health encounter.  Pt alert and oriented, answers all questions appropriately, and able to speak in full sentences.  Physical Exam Constitutional:      General: She is not in acute distress. HENT:     Head: Normocephalic and atraumatic.  Pulmonary:     Effort: Pulmonary effort is normal.  Neurological:     Mental Status: She is alert.  Psychiatric:        Mood and Affect: Mood normal.        Behavior: Behavior normal.        Thought Content: Thought content normal.        Judgment: Judgment normal.     Urine dipstick shows positive for RBC's, 2+ positive for protein 1+, positive for nitrates, and positive for leukocytes 3+.  Micro exam:  Greater than 30 WBC's per HPF, greater than 30 RBC's per HPF, many + bacteria, and epithelial cells 0-10.  Assessment and Plan: Arlenne was seen today for dysuria.  Diagnoses and all orders for this visit:  Acute cystitis with hematuria -     Urine Culture -     sulfamethoxazole -trimethoprim  (BACTRIM  DS) 800-160 MG tablet; Take 1 tablet by mouth 2 (two) times daily for 7 days.    Assessment: Acute urinary tract infection (UTI) Acute confusion likely secondary to UTI Plan: Start Trimethoprim -sulfamethoxazole  (Bactrim  DS) 1 tablet by mouth twice daily for 7 days Urine culture pending; adjust antibiotic therapy as indicated based on sensitivities Encourage increased oral hydration Educate patient/caregiver on warning signs including worsening confusion, fever, flank pain, or lack of improvement within 48-72 hours ER precautions reviewed    Urinary Tract Infection, Female A urinary tract infection (UTI) is an infection in your urinary tract. The urinary tract is made up of organs that make, store, and get rid of pee (urine) in your body. These organs include: The kidneys. The ureters. The bladder. The urethra. What are the causes? Most UTIs are caused by germs called bacteria. They may be in or near your genitals. These germs grow and cause swelling in your urinary tract. What increases the risk? You're more likely to get a UTI if: You're a female. The urethra is shorter in females than in males. You have a soft tube called a catheter that drains your pee. You can't control when you pee or poop. You have trouble peeing because of: A kidney stone. A urinary blockage. A nerve condition that affects your bladder. Not getting enough to drink. You're sexually active. You use a birth control inside your vagina, like spermicide. You're pregnant. You have low levels of the hormone estrogen in your body. You're an older adult. You're also more likely to get a UTI if you have other  health problems. These may include: Diabetes. A weak immune system. Your immune system is your body's defense system. Sickle cell disease. Injury of the spine. What are the signs or symptoms? Symptoms may include: Needing to pee right away. Peeing small amounts often. Pain or burning when you pee. Blood in your pee. Pee that smells bad or odd. Pain in your belly or lower back. You may also: Feel confused. This may be the first symptom in older adults. Vomit. Not feel hungry. Feel tired or easily annoyed. Have a fever or chills. How is this diagnosed? A UTI is diagnosed based on your medical history and an exam. You may also have other tests. These may include: Pee tests. Blood tests. Tests for sexually transmitted infections (STIs). If you've had more than one UTI, you may need to have imaging studies done to find out why you keep getting them. How is this treated? A UTI can be treated by: Taking antibiotics or other medicines. Drinking enough fluid to keep your pee pale yellow. In rare cases, a UTI can cause a very bad condition called sepsis. Sepsis may be treated in the hospital. Follow these instructions at home: Medicines Take your medicines only as told by your health care provider. If you were given antibiotics, take them as told by your provider. Do not stop taking them even if you start to feel better. General instructions Make sure you: Pee often and fully. Do not hold your pee for a long time. Wipe from front to back after you pee or poop. Use each tissue only once when you  wipe. Pee after you have sex. Do not douche or use sprays or powders in your genital area. Contact a health care provider if: Your symptoms don't get better after 1-2 days of taking antibiotics. Your symptoms go away and then come back. You have a fever or chills. You vomit or feel like you may vomit. Get help right away if: You have very bad pain in your back or lower belly. You  faint. This information is not intended to replace advice given to you by your health care provider. Make sure you discuss any questions you have with your health care provider. Document Revised: 04/20/2023 Document Reviewed: 08/13/2022 Elsevier Patient Education  2025 [employer].   Follow Up Instructions: Return for As already scheduled with PCP.    I discussed the assessment and treatment plan with the patient. The patient was provided an opportunity to ask questions and all were answered. The patient agreed with the plan and demonstrated an understanding of the instructions.   The patient was advised to call back or seek an in-person evaluation if the symptoms worsen or if the condition fails to improve as anticipated.  The above assessment and management plan was discussed with the patient. The patient verbalized understanding of and has agreed to the management plan. Patient is aware to call the clinic if they develop any new symptoms or if symptoms persist or worsen. Patient is aware when to return to the clinic for a follow-up visit. Patient educated on when it is appropriate to go to the emergency department.    I provided 12 minutes of time during this video encounter.   Nena Cassis Morton Hummer, DNP [facility] [address] [place], [place] [address] [phone] 06/06/2024     [1]  Allergies Allergen Reactions   Donepezil Hcl Other (See Comments)    Unknown, received from [facility]   Hydrocodone-Acetaminophen  Nausea And Vomiting   "

## 2024-06-06 NOTE — Telephone Encounter (Signed)
 Left message to call back to reschedule physical, Dr. Jolinda is out of office today 1/14.

## 2024-06-07 ENCOUNTER — Ambulatory Visit: Payer: Self-pay | Admitting: Nurse Practitioner

## 2024-06-08 LAB — URINE CULTURE

## 2024-06-11 ENCOUNTER — Ambulatory Visit: Payer: Self-pay | Admitting: Nurse Practitioner

## 2024-06-12 ENCOUNTER — Encounter: Payer: Self-pay | Admitting: Family Medicine

## 2024-06-12 ENCOUNTER — Telehealth: Admitting: Family Medicine

## 2024-06-12 DIAGNOSIS — F02B4 Dementia in other diseases classified elsewhere, moderate, with anxiety: Secondary | ICD-10-CM

## 2024-06-12 DIAGNOSIS — F411 Generalized anxiety disorder: Secondary | ICD-10-CM

## 2024-06-12 DIAGNOSIS — F4320 Adjustment disorder, unspecified: Secondary | ICD-10-CM

## 2024-06-12 DIAGNOSIS — G301 Alzheimer's disease with late onset: Secondary | ICD-10-CM

## 2024-06-12 MED ORDER — LORAZEPAM 0.5 MG PO TABS: 0.2500 mg | ORAL_TABLET | Freq: Two times a day (BID) | ORAL | 5 refills | Status: AC | PRN

## 2024-06-12 MED ORDER — RISPERIDONE 1 MG PO TABS: 1.0000 mg | ORAL_TABLET | Freq: Every day | ORAL | 0 refills | Status: AC

## 2024-06-12 NOTE — Progress Notes (Signed)
 MyChart Video visit  Subjective: [id number] PCP: Eileen Norene HERO, DO YEP:Eileen Jennings is a 84 y.o. female. Patient provides verbal consent for consult held via video.  Due to COVID-19 pandemic this visit was conducted virtually. This visit type was conducted due to national recommendations for restrictions regarding the COVID-19 Pandemic (e.g. social distancing, sheltering in place) in an effort to limit this patient's exposure and mitigate transmission in our community. All issues noted in this document were discussed and addressed.  A physical exam was not performed with this format.   Location of patient: home Location of provider: [facility] Others present for call: [name], caretaker  Much of the information has been provided by both patient's daughter [name], and her caretaker [name].  She notes that her agitation has been more over the last several weeks and she is not quite sure why.  She has not been resting well over the last couple of nights.  She has been taking 20 mg of melatonin per night, Ativan  0.5 mg as needed and risperidone  0.5 mg nightly.  BuSpar  is available for anxiety as well.  She will have episodes where she feels like she needs to get out of the house.  No reports of visual or auditory hallucinations.   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  Have advance the risperidone  to 1 mg.  I advised against more than 6 mg of melatonin daily in this geriatric patient with dementia.  Continue to use Ativan  as needed.  This medication has been renewed for her and we will plan to update UDS and CSC at next visit.  National narcotic database reviewed with no red flags.  Keep 1 month follow-up as scheduled.  Referral placed to neurology  Start time: 11:57am End time: 12:06pm  Total time spent on patient care (including video visit/ documentation): 14 minutes  Eileen Jennings Eileen Mitsue Peery, DO [facility] [phone]      [1]  Allergies Allergen Reactions   Donepezil Hcl Other (See Comments)    Unknown, received from [facility]   Hydrocodone-Acetaminophen  Nausea And Vomiting  [2]  Current Outpatient Medications:    acetaminophen  (TYLENOL ) 500 MG tablet, Take 500 mg by mouth in the morning, at noon, and at bedtime., Disp: , Rfl:    aspirin EC 81 MG tablet, Take 81 mg by mouth every 6 (six) hours as needed for mild pain (pain score 1-3). Swallow whole., Disp: , Rfl:    Aspirin-Acetaminophen -Caffeine (GOODY HEADACHE PO), Take  1 Package by mouth daily as needed (headaches)., Disp: , Rfl:    atorvastatin  (LIPITOR) 80 MG tablet, TAKE ONE (1) TABLET BY MOUTH EVERY DAY (Patient taking differently: Take 80 mg by mouth daily.), Disp: 90 tablet, Rfl: 1   B Complex Vitamins (VITAMIN B COMPLEX PO), Take 1 tablet by mouth daily., Disp: , Rfl:    busPIRone  (BUSPAR ) 15 MG tablet, Take 1 tablet (15 mg total) by mouth 3 (three) times daily., Disp: 270 tablet, Rfl: 1   cholecalciferol (VITAMIN D3) 25 MCG (1000 UNIT) tablet, Take 1,000 Units by mouth  daily., Disp: , Rfl:    denosumab  (PROLIA ) 60 MG/ML SOSY injection, Inject 60 mg into the skin every 6 (six) months., Disp: , Rfl:    ferrous sulfate 325 (65 FE) MG tablet, Take 325 mg by mouth at bedtime., Disp: , Rfl:    gabapentin  (NEURONTIN ) 300 MG capsule, Take 1 capsule (300 mg total) by mouth 3 (three) times daily., Disp: 270 capsule, Rfl: 1   guaiFENesin-dextromethorphan (ROBITUSSIN DM) 100-10 MG/5ML syrup, Take 5 mLs by mouth every 4 (four) hours as needed for cough., Disp: , Rfl:    LORazepam  (ATIVAN ) 0.5 MG tablet, Take 0.5-1 tablets (0.25-0.5 mg total) by mouth 2 (two) times daily as needed for anxiety (USE ONLY IF ABSOLUTELY NEEDED)., Disp: 20 tablet, Rfl: 1   Melatonin 10 MG TABS, Take 1 tablet by mouth at bedtime., Disp: , Rfl:    memantine  (NAMENDA ) 10 MG tablet, Take 1 tablet (10 mg total) by mouth 2 (two) times daily., Disp: 180 tablet, Rfl: 3   metoprolol  succinate (TOPROL -XL) 100 MG 24 hr tablet, Take 1 tablet (100 mg total) by mouth every evening., Disp: 90 tablet, Rfl: 3   nitrofurantoin  (MACRODANTIN ) 50 MG capsule, Take 1 capsule (50 mg total) by mouth at bedtime., Disp: 30 capsule, Rfl: 11   predniSONE  (DELTASONE ) 5 MG tablet, Take 1 tablet (5 mg total) by mouth every morning. Further fills per Dr Mai, Disp: 90 tablet, Rfl: 0   risperiDONE  (RISPERDAL ) 0.5 MG tablet, Take 1 tablet (0.5 mg total) by mouth at bedtime., Disp: 60 tablet, Rfl: 0   sulfamethoxazole -trimethoprim  (BACTRIM  DS) 800-160 MG tablet, Take 1 tablet by mouth 2 (two) times daily for 7 days., Disp: 14 tablet, Rfl: 0   Vibegron  (GEMTESA ) 75 MG TABS, Take 1 tablet (75 mg total) by mouth daily., Disp: 30 tablet, Rfl: 11

## 2024-06-15 ENCOUNTER — Encounter: Payer: Self-pay | Admitting: Family Medicine

## 2024-07-18 ENCOUNTER — Ambulatory Visit

## 2024-11-12 ENCOUNTER — Ambulatory Visit: Admitting: Urology
